# Patient Record
Sex: Female | Born: 1980
Health system: Southern US, Community
[De-identification: ages and names within clinical notes are randomized; demographics above are authoritative.]

## PROBLEM LIST (undated history)

## (undated) DIAGNOSIS — F419 Anxiety disorder, unspecified: Secondary | ICD-10-CM

## (undated) DIAGNOSIS — K5900 Constipation, unspecified: Secondary | ICD-10-CM

## (undated) DIAGNOSIS — E282 Polycystic ovarian syndrome: Secondary | ICD-10-CM

## (undated) DIAGNOSIS — F329 Major depressive disorder, single episode, unspecified: Secondary | ICD-10-CM

## (undated) DIAGNOSIS — M1990 Unspecified osteoarthritis, unspecified site: Secondary | ICD-10-CM

## (undated) DIAGNOSIS — K589 Irritable bowel syndrome without diarrhea: Secondary | ICD-10-CM

## (undated) DIAGNOSIS — D219 Benign neoplasm of connective and other soft tissue, unspecified: Secondary | ICD-10-CM

## (undated) DIAGNOSIS — N63 Unspecified lump in unspecified breast: Secondary | ICD-10-CM

## (undated) DIAGNOSIS — G894 Chronic pain syndrome: Secondary | ICD-10-CM

## (undated) DIAGNOSIS — F32A Depression, unspecified: Secondary | ICD-10-CM

## (undated) DIAGNOSIS — E119 Type 2 diabetes mellitus without complications: Secondary | ICD-10-CM

## (undated) DIAGNOSIS — K219 Gastro-esophageal reflux disease without esophagitis: Secondary | ICD-10-CM

## (undated) HISTORY — DX: Irritable bowel syndrome, unspecified: K58.9

## (undated) HISTORY — PX: KNEE SURGERY: SHX244

## (undated) HISTORY — DX: Depression, unspecified: F32.A

## (undated) HISTORY — DX: Constipation, unspecified: K59.00

## (undated) HISTORY — DX: Chronic pain syndrome: G89.4

## (undated) HISTORY — DX: Benign neoplasm of connective and other soft tissue, unspecified: D21.9

## (undated) HISTORY — PX: POLYPECTOMY: SHX149

## (undated) HISTORY — DX: Major depressive disorder, single episode, unspecified: F32.9

## (undated) HISTORY — DX: Polycystic ovarian syndrome: E28.2

## (undated) HISTORY — DX: Unspecified osteoarthritis, unspecified site: M19.90

## (undated) HISTORY — PX: WISDOM TOOTH EXTRACTION: SHX21

## (undated) HISTORY — PX: MYOMECTOMY: SHX85

## (undated) HISTORY — DX: Gastro-esophageal reflux disease without esophagitis: K21.9

## (undated) HISTORY — PX: COLONOSCOPY: SHX174

## (undated) HISTORY — PX: OTHER SURGICAL HISTORY: SHX169

## (undated) HISTORY — DX: Anxiety disorder, unspecified: F41.9

---

## 2004-05-22 ENCOUNTER — Other Ambulatory Visit: Admission: RE | Admit: 2004-05-22 | Discharge: 2004-05-22 | Payer: Self-pay | Admitting: Gynecology

## 2005-10-12 ENCOUNTER — Emergency Department (HOSPITAL_COMMUNITY): Admission: EM | Admit: 2005-10-12 | Discharge: 2005-10-12 | Payer: Self-pay | Admitting: Emergency Medicine

## 2006-12-22 ENCOUNTER — Other Ambulatory Visit: Admission: RE | Admit: 2006-12-22 | Discharge: 2006-12-22 | Payer: Self-pay | Admitting: Gynecology

## 2007-10-19 ENCOUNTER — Ambulatory Visit: Payer: Self-pay | Admitting: Internal Medicine

## 2007-10-19 LAB — CONVERTED CEMR LAB
AST: 19 units/L (ref 0–37)
BUN: 11 mg/dL (ref 6–23)
Basophils Absolute: 0 10*3/uL (ref 0.0–0.1)
CO2: 27 meq/L (ref 19–32)
Calcium: 9.4 mg/dL (ref 8.4–10.5)
Chloride: 106 meq/L (ref 96–112)
GFR calc Af Amer: 96 mL/min
HCT: 40.9 % (ref 36.0–46.0)
MCV: 86.8 fL (ref 78.0–100.0)
Neutro Abs: 3 10*3/uL (ref 1.4–7.7)
Neutrophils Relative %: 60.2 % (ref 43.0–77.0)
Platelets: 201 10*3/uL (ref 150–400)
RBC: 4.72 M/uL (ref 3.87–5.11)
TSH: 0.61 microintl units/mL (ref 0.35–5.50)
WBC: 4.9 10*3/uL (ref 4.5–10.5)

## 2007-10-20 ENCOUNTER — Ambulatory Visit: Payer: Self-pay | Admitting: Internal Medicine

## 2007-11-10 ENCOUNTER — Ambulatory Visit: Payer: Self-pay | Admitting: Internal Medicine

## 2008-01-11 ENCOUNTER — Other Ambulatory Visit: Admission: RE | Admit: 2008-01-11 | Discharge: 2008-01-11 | Payer: Self-pay | Admitting: Obstetrics and Gynecology

## 2008-02-21 ENCOUNTER — Ambulatory Visit: Payer: Self-pay | Admitting: Internal Medicine

## 2008-08-27 ENCOUNTER — Other Ambulatory Visit: Admission: RE | Admit: 2008-08-27 | Discharge: 2008-08-27 | Payer: Self-pay | Admitting: Obstetrics and Gynecology

## 2010-03-15 ENCOUNTER — Emergency Department (HOSPITAL_BASED_OUTPATIENT_CLINIC_OR_DEPARTMENT_OTHER): Admission: EM | Admit: 2010-03-15 | Discharge: 2010-03-16 | Payer: Self-pay | Admitting: Emergency Medicine

## 2010-03-16 ENCOUNTER — Ambulatory Visit: Payer: Self-pay | Admitting: Radiology

## 2010-12-25 ENCOUNTER — Telehealth (INDEPENDENT_AMBULATORY_CARE_PROVIDER_SITE_OTHER): Payer: Self-pay | Admitting: *Deleted

## 2010-12-29 NOTE — Progress Notes (Signed)
  Phone Note Other Incoming   Request: Send information Summary of Call: Request for records received from Lorenza Burton. Request forwarded to [employer].  Marina Goodell

## 2011-01-18 LAB — URINALYSIS, ROUTINE W REFLEX MICROSCOPIC
Glucose, UA: NEGATIVE mg/dL
Ketones, ur: NEGATIVE mg/dL
Nitrite: NEGATIVE
Protein, ur: NEGATIVE mg/dL
Specific Gravity, Urine: 1.016 (ref 1.005–1.030)
pH: 6 (ref 5.0–8.0)

## 2011-03-16 NOTE — Assessment & Plan Note (Signed)
[facility]                         GASTROENTEROLOGY OFFICE NOTE   NAME:Bowman, Heather                        MRN:          [id number]  DATE:11/10/2007                            DOB:          06/15/1981    HISTORY:  Heather Bowman presents for followup.  She is a 30 year old who was  evaluated October 19, 2007 for a 37-month history of multiple GI  complaints, manifested principally by abdominal cramping, urgency,  diarrhea, and gas.  As well, nausea with rare bouts of vomiting.  See  that dictation for details.  She underwent multiple stool studies, which  were negative for enteric pathogens.  There was no evidence of fecal fat  or occult blood.  Multiple laboratories including CBC, comprehensive  metabolic panel, thyroid profile, and C-reactive protein were also  normal.  She then underwent upper endoscopy.  She was found to have mild  esophagitis.  Otherwise, normal exam.  Biopsies of the post bulbar  duodenum were normal.  Biopsies of the normal-appearing gastric antrum  revealed chronic active gastritis with Helicobacter pylori.  She was  prescribed Nexium, but subsequently took omeprazole due to cost.  She  states this worsened her diarrhea and, thus, discontinued medication.  She was also prescribed Bentyl 20 mg 3 times daily, which she is taking.  She also takes Imodium about 4 per day, as well as Gas-X.  She states  her symptoms are essentially unchanged from the last visit.   PHYSICAL EXAM:  Well-appearing female.  Blood pressure is 108/76, heart rate is 80, weight 131.2 pounds (no  change).  ABDOMEN:  Not re-examined.   IMPRESSION:  I suspect Heather Bowman has irritable bowel syndrome.  We  discussed irritable bowel syndrome in detail, as well as supplemental  literature provided.  She also has Helicobacter pylori on gastric  biopsies.  I doubt this is responsible for significant symptoms.   RECOMMENDATIONS:  1. Prevpac x2 weeks for eradication of  Helicobacter pylori.  2. Continue Bentyl and Imodium for symptomatic relief.  3. Routine office followup in 6 weeks for reassessment.     Wilhemina Bonito. Marina Goodell, MD  Electronically Signed    JNP/MedQ  DD: 11/10/2007  DT: 11/10/2007  Job #: [id number]   cc:   Marcial Pacas P. Fontaine, M.D.

## 2011-03-16 NOTE — Assessment & Plan Note (Signed)
[facility]                         GASTROENTEROLOGY OFFICE NOTE   NAME:Bowman, Heather                        MRN:          [id number]  DATE:10/19/2007                            DOB:          22-Mar-1981    The patient is self referred.   REASON FOR CONSULTATION:  Multiple GI complaints.   HISTORY:  This is a pleasant 30 year old African American female with no  significant past medical history who presents herself today for  evaluation of multiple GI complaints of 6 months' duration.  Her chief  complaint is that of abdominal cramping, urgency, and diarrhea.  This  alternating with bouts of constipation.  Her symptoms are worse in the  morning.  She takes Imodium on a regular basis.  She denies bleeding,  fevers, or abdominal pain other than pre-defecatory cramping.  She has  had food avoidance and 6 to 8 pounds weight loss.  Stress may be a  factor, though food does not seem to bring on symptoms.  She also  reports to me that she has had fairly regular nausea, particularly in  the morning.  She also vomits about once every 2 weeks.  The vomitus  tends to contain food from the previous evening's meal.  She has not had  evaluation or other treatment.   PAST MEDICAL HISTORY:  None.   PAST SURGICAL HISTORY:  None.   ALLERGIES:  No known drug allergies.   CURRENT MEDICATIONS:  None.   FAMILY HISTORY:  Negative for gastrointestinal disorders.   SOCIAL HISTORY:  The patient is single and lives alone.  She works at  [employer] as a Conservation officer, nature.  She does not smoke.  She uses alcohol  on weekends only.   REVIEW OF SYSTEMS:  Per diagnostic evaluation, also separate from  diagnostic evaluation form, of note excessive thirst and urination and  sleeping problems.  Her menstrual periods are regular and last 4-7 days.  Her last menstrual period was finished about 2 weeks ago.   PHYSICAL EXAMINATION:  GENERAL:  A well-appearing female in no acute  distress.  She is alert and oriented.  VITAL SIGNS:  Blood pressure is 106/80, heart rate 68 and regular,  weight is 130.8 pounds.  She is 5 feet 5 inches in height.  HEENT:  Sclerae are anicteric.  Conjunctivae are pink.  Oral mucosa is  intact.  No adenopathy.  LUNGS:  Clear.  HEART:  Regular.  ABDOMEN:  Soft without tenderness, mass, or hernia.  Good bowel sounds  heard.  EXTREMITIES:  Without edema.   IMPRESSION:  This is a 30 year old female who presents with a six month  history of crampy, urgency, and loose stools as well somewhat chronic  nausea and infrequent vomiting (with undigested food).  She has also had  weight loss.  Symptoms are atypical for irritable bowel.   RECOMMENDATIONS:  1. Multiple stool studies to exclude enteric pathogens,fecal      fat,occult blood.  2. Laboratory including CBC, comprehensive metabolic panel, thyroid      profile, and C-reactive protein.  3. Schedule upper endoscopy with gastric and  duodenal biopsies.  4. Further evaluation including possible treatment with antimicrobials      or probiotics to be determined after the results of the above      workup are known.     Wilhemina Bonito. Marina Goodell, MD  Electronically Signed    JNP/MedQ  DD: 10/19/2007  DT: 10/19/2007  Job #: [id number]   cc:   Marcial Pacas P. Fontaine, M.D.

## 2011-03-16 NOTE — Assessment & Plan Note (Signed)
[facility]                         GASTROENTEROLOGY OFFICE NOTE   NAME:[name], [name]                        MRN:          [id number]  DATE:02/21/2008                            DOB:          1981/06/10    HISTORY:  Heather Bowman presents today with complaints of abdominal cramping,  urgency, and increased intestinal gas.  She was initially evaluated  October 19, 2007, for multiple GI complaints of six months' duration.  See that dictation for details.  She underwent extensive workup  including stool studies and laboratories.  These were negative.  She  also underwent upper endoscopy with biopsies which revealed gastritis  with H. pylori.  No sprue or eosinophilic changes.  She was last seen in  the office November 10, 2007.  At that time she was felt to have irritable  bowel syndrome, likely post infectious.  Helicobacter pylori was  treated.  It was recommended she consider using Bentyl and Imodium for  symptomatic relief and follow up in six weeks.  She follows up at this  time.  She tells me that Prevpac seemed to help some.  Bentyl had no  positive effect.  She uses Imodium 2-6 times per day to help control gas  and urgency.  She has had no problems with nausea, vomiting, or  constipation.  Biggest complaint is increased gas and feces that have a  foul odor.  She thinks her symptoms may be worse with dairy products and  vegetables.  Of interest, the patient averages having a bowel movement  once every other day.  She certainly does not have a bowel movement  daily or more than once per day.  No bleeding, steatorrhea, or other  issues.  Weight has been stable.  Her only medication is Imodium p.r.n.   PHYSICAL EXAMINATION:  GENERAL:  Well-appearing female in no acute  distress.  VITAL SIGNS:  Blood pressure 120/56, heart rate 98, weight 129.6 pounds.  HEENT:  Sclerae anicteric.  ABDOMEN: Soft without tenderness, mass, or hernia.  Good bowel sounds  heard.   IMPRESSION:  Irritable bowel syndrome, likely post infectious.   RECOMMENDATIONS:  1. Empiric treatment with the probiotic Align.  Three weeks of samples      have been given.  She may use this on demand if helpful.  2. Okay to use Imodium as needed for true diarrhea but not for gas.  3. Told that she could try over-the-counter lactaid to see if this      helps symptoms when consuming dairy products.  4. Follow up p.r.n.     Wilhemina Bonito. Marina Goodell, MD  Electronically Signed    JNP/MedQ  DD: 02/21/2008  DT: 02/21/2008  Job #: [id number]   cc:   Marcial Pacas P. Fontaine, M.D.

## 2012-05-02 ENCOUNTER — Ambulatory Visit (INDEPENDENT_AMBULATORY_CARE_PROVIDER_SITE_OTHER): Payer: Self-pay | Admitting: Family Medicine

## 2012-05-02 ENCOUNTER — Encounter: Payer: Self-pay | Admitting: Family Medicine

## 2012-05-02 VITALS — BP 131/87 | HR 102 | Temp 98.5°F | Ht 65.5 in | Wt 168.0 lb

## 2012-05-02 DIAGNOSIS — G894 Chronic pain syndrome: Secondary | ICD-10-CM

## 2012-05-02 DIAGNOSIS — F411 Generalized anxiety disorder: Secondary | ICD-10-CM

## 2012-05-02 DIAGNOSIS — K219 Gastro-esophageal reflux disease without esophagitis: Secondary | ICD-10-CM

## 2012-05-02 DIAGNOSIS — Y990 Civilian activity done for income or pay: Secondary | ICD-10-CM

## 2012-05-02 MED ORDER — BUPROPION HCL ER (XL) 150 MG PO TB24: 150.0000 mg | ORAL_TABLET | Freq: Every day | ORAL | Status: DC

## 2012-05-02 MED ORDER — TIZANIDINE HCL 2 MG PO CAPS: 2.0000 mg | ORAL_CAPSULE | Freq: Two times a day (BID) | ORAL | Status: DC

## 2012-05-02 MED ORDER — IBUPROFEN-FAMOTIDINE 800-26.6 MG PO TABS: 1.0000 | ORAL_TABLET | ORAL | Status: DC

## 2012-05-02 MED ORDER — TAPENTADOL HCL 75 MG PO TABS: 1.0000 | ORAL_TABLET | Freq: Three times a day (TID) | ORAL | Status: DC | PRN

## 2012-05-02 NOTE — Assessment & Plan Note (Signed)
Patient under worker's comp and seen at [facility].  She will continue to get medications from Dr. Manon Hilding.

## 2012-05-02 NOTE — Patient Instructions (Signed)
Return to clinic after you get your orange card.

## 2012-05-02 NOTE — Progress Notes (Signed)
  Subjective:    Patient ID: Heather Bowman, female    DOB: 1981/05/03, 31 y.o.   MRN: [id number]  HPI  Patient presents to clinic to establish care.  She was former patient at [facility], but lost insurance after an injury at work in 2011.  She injured her neck and LT shoulder from heavy lifting.  [facility] which is covered by worker's comp.  She says the pain medications are working well, she has been on them for about one year.  Patient has a hx of PCOS but has stopped taking progesterone pills.  She would like to get pregnant, but was told she is infertile.  She would like to get lab work done (not specific about which ones), but understands that labs will be postponed until she receives orange cards.  Otherwise, patient is doing well and has no complaints/concerns.  Reviewed patient's PMH, FH, SH, and Meds/Allergies at this visit.  Review of Systems  Per HPI     Objective:   Physical Exam  General: in no acute distress, alert, and cooperative Psych: normal behavior and affect     Assessment & Plan:

## 2012-05-02 NOTE — Assessment & Plan Note (Signed)
Continue Bupropion per worker's comp physician.

## 2012-05-09 ENCOUNTER — Telehealth: Payer: Self-pay | Admitting: Family Medicine

## 2012-05-09 ENCOUNTER — Encounter: Payer: Self-pay | Admitting: Family Medicine

## 2012-05-09 ENCOUNTER — Ambulatory Visit (INDEPENDENT_AMBULATORY_CARE_PROVIDER_SITE_OTHER): Payer: Self-pay | Admitting: Family Medicine

## 2012-05-09 VITALS — BP 125/81 | HR 76 | Temp 99.2°F | Ht 65.5 in | Wt 167.8 lb

## 2012-05-09 DIAGNOSIS — N926 Irregular menstruation, unspecified: Secondary | ICD-10-CM

## 2012-05-09 DIAGNOSIS — N949 Unspecified condition associated with female genital organs and menstrual cycle: Secondary | ICD-10-CM

## 2012-05-09 DIAGNOSIS — N920 Excessive and frequent menstruation with regular cycle: Secondary | ICD-10-CM | POA: Insufficient documentation

## 2012-05-09 DIAGNOSIS — Z23 Encounter for immunization: Secondary | ICD-10-CM

## 2012-05-09 DIAGNOSIS — R102 Pelvic and perineal pain: Secondary | ICD-10-CM

## 2012-05-09 DIAGNOSIS — Z1384 Encounter for screening for dental disorders: Secondary | ICD-10-CM

## 2012-05-09 LAB — CBC
HCT: 40.7 % (ref 36.0–46.0)
Hemoglobin: 13.8 g/dL (ref 12.0–15.0)
MCH: 27.4 pg (ref 26.0–34.0)
MCHC: 33.9 g/dL (ref 30.0–36.0)
WBC: 4.6 10*3/uL (ref 4.0–10.5)

## 2012-05-09 LAB — BASIC METABOLIC PANEL
BUN: 12 mg/dL (ref 6–23)
Glucose, Bld: 79 mg/dL (ref 70–99)
Potassium: 4.3 mEq/L (ref 3.5–5.3)

## 2012-05-09 MED ORDER — METFORMIN HCL 500 MG PO TABS: 500.0000 mg | ORAL_TABLET | Freq: Every day | ORAL | Status: DC

## 2012-05-09 MED ORDER — PROMETHAZINE HCL 12.5 MG PO TABS: 12.5000 mg | ORAL_TABLET | Freq: Four times a day (QID) | ORAL | Status: DC | PRN

## 2012-05-09 NOTE — Telephone Encounter (Signed)
Forgot to ask Dr Tye Savoy for a referral to dental

## 2012-05-09 NOTE — Telephone Encounter (Signed)
Spoke with patient and informed her that referral was put in and that it may take a couple weeks to a couple months to get an answer back due to them only taking 10 patients a month

## 2012-05-09 NOTE — Telephone Encounter (Signed)
Referral done

## 2012-05-09 NOTE — Assessment & Plan Note (Signed)
Pelvic pain likely due to severe menstrual cramps from irregular endometrial shedding. Will order pelvic ultrasound to rule out any masses or ovarian abnormalities. Will order CBC to rule out anemia and BMET to check for blood glucose. For PCOS, will start Metformin 500 daily and encouraged weight loss to help regulate periods. Follow up ultrasound, will likely start Provera 10 mg daily x 7 days. Patient to follow up with me every 3 months or as needed.

## 2012-05-09 NOTE — Patient Instructions (Addendum)
It was good to see you again. I will call you with results of ultrasound 48 hours after you get it done. Please pick up Metformin and Phenergan at the pharmacy and take as directed. Schedule follow up appointment with me in 3 months or sooner as needed.  Polycystic Ovarian Syndrome Polycystic ovarian syndrome is a condition with a number of problems. One problem is with the ovaries. The ovaries are organs located in the female pelvis, on each side of the uterus. Usually, during the menstrual cycle, an egg is released from 1 ovary every month. This is called ovulation. When the egg is fertilized, it goes into the womb (uterus), which allows for the growth of a baby. The egg travels from the ovary through the fallopian tube to the uterus. The ovaries also make the hormones estrogen and progesterone. These hormones help the development of a woman's breasts, body shape, and body hair. They also regulate the menstrual cycle and pregnancy. Sometimes, cysts form in the ovaries. A cyst is a fluid-filled sac. On the ovary, different types of cysts can form. The most common type of ovarian cyst is called a functional or ovulation cyst. It is normal, and often forms during the normal menstrual cycle. Each month, a woman's ovaries grow tiny cysts that hold the eggs. When an egg is fully grown, the sac breaks open. This releases the egg. Then, the sac which released the egg from the ovary dissolves. In one type of functional cyst, called a follicle cyst, the sac does not break open to release the egg. It may actually continue to grow. This type of cyst usually disappears within 1 to 3 months.  One type of cyst problem with the ovaries is called Polycystic Ovarian Syndrome (PCOS). In this condition, many follicle cysts form, but do not rupture and produce an egg. This health problem can affect the following:  Menstrual cycle.   Heart.   Obesity.   Cancer of the uterus.   Fertility.   Blood vessels.   Hair  growth (face and body) or baldness.   Hormones.   Appearance.   High blood pressure.   Stroke.   Insulin production.   Inflammation of the liver.   Elevated blood cholesterol and triglycerides.  CAUSES   No one knows the exact cause of PCOS.   Women with PCOS often have a mother or sister with PCOS. There is not yet enough proof to say this is inherited.   Many women with PCOS have a weight problem.   Researchers are looking at the relationship between PCOS and the body's ability to make insulin. Insulin is a hormone that regulates the change of sugar, starches, and other food into energy for the body's use, or for storage. Some women with PCOS make too much insulin. It is possible that the ovaries react by making too many female hormones, called androgens. This can lead to acne, excessive hair growth, weight gain, and ovulation problems.   Too much production of luteinizing hormone (LH) from the pituitary gland in the brain stimulates the ovary to produce too much female hormone (androgen).  SYMPTOMS   Infrequent or no menstrual periods, and/or irregular bleeding.   Inability to get pregnant (infertility), because of not ovulating.   Increased growth of hair on the face, chest, stomach, back, thumbs, thighs, or toes.   Acne, oily skin, or dandruff.   Pelvic pain.   Weight gain or obesity, usually carrying extra weight around the waist.   Type 2  diabetes (this is the diabetes that usually does not need insulin).   High cholesterol.   High blood pressure.   Female-pattern baldness or thinning hair.   Patches of thickened and dark brown or black skin on the neck, arms, breasts, or thighs.   Skin tags, or tiny excess flaps of skin, in the armpits or neck area.   Sleep apnea (excessive snoring and breathing stops at times while asleep).   Deepening of the voice.   Gestational diabetes when pregnant.   Increased risk of miscarriage with pregnancy.  DIAGNOSIS  There is  no single test to diagnose PCOS.   Your caregiver will:   Take a medical history.   Perform a pelvic exam.   Perform an ultrasound.   Check your female and female hormone levels.   Measure glucose or sugar levels in the blood.   Do other blood tests.   If you are producing too many female hormones, your caregiver will make sure it is from PCOS. At the physical exam, your caregiver will want to evaluate the areas of increased hair growth. Try to allow natural hair growth for a few days before the visit.   During a pelvic exam, the ovaries may be enlarged or swollen by the increased number of small cysts. This can be seen more easily by vaginal ultrasound or screening, to examine the ovaries and lining of the uterus (endometrium) for cysts. The uterine lining may become thicker, if there has not been a regular period.  TREATMENT  Because there is no cure for PCOS, it needs to be managed to prevent problems. Treatments are based on your symptoms. Treatment is also based on whether you want to have a baby or whether you need contraception.  Treatment may include:  Progesterone hormone, to start a menstrual period.   Birth control pills, to make you have regular menstrual periods.   Medicines to make you ovulate, if you want to get pregnant.   Medicines to control your insulin.   Medicine to control your blood pressure.   Medicine and diet, to control your high cholesterol and triglycerides in your blood.   Surgery, making small holes in the ovary, to decrease the amount of female hormone production. This is done through a long, lighted tube (laparoscope), placed into the pelvis through a tiny incision in the lower abdomen.  Your caregiver will go over some of the choices with you. WOMEN WITH PCOS HAVE THESE CHARACTERISTICS:  High levels of female hormones called androgens.   An irregular or no menstrual cycle.   May have many small cysts in their ovaries.  PCOS is the most common  hormonal reproductive problem in women of childbearing age. WHY DO WOMEN WITH PCOS HAVE TROUBLE WITH THEIR MENSTRUAL CYCLE? Each month, about 20 eggs start to mature in the ovaries. As one egg grows and matures, the follicle breaks open to release the egg, so it can travel through the fallopian tube for fertilization. When the single egg leaves the follicle, ovulation takes place. In women with PCOS, the ovary does not make all of the hormones it needs for any of the eggs to fully mature. They may start to grow and accumulate fluid, but no one egg becomes large enough. Instead, some may remain as cysts. Since no egg matures or is released, ovulation does not occur and the hormone progesterone is not made. Without progesterone, a woman's menstrual cycle is irregular or absent. Also, the cysts produce female hormones, which continue to  prevent ovulation.  Document Released: 02/11/2005 Document Revised: 10/07/2011 Document Reviewed: 09/05/2009 [facility] Patient Information 2012 [place], [place].

## 2012-05-09 NOTE — Progress Notes (Signed)
  Subjective:    Patient ID: Heather Bowman, female    DOB: 06/20/1981, 31 y.o.   MRN: [id number]  HPI  Patient presents to clinic to discuss previous diagnosis of PCOS in 2010.  She remembers having a pelvic ultrasound that showed multiple cysts, however she lost her insurance and was unable to follow up with OB-GYN.  Patient's main concern is prolonged bleeding.  Has been bleeding non-stop for 4 weeks.  Started out as spotting but last 3 days, bleeding has been heavy and very painful.    Abdominal pain localized to RT lower pelvic area, no radiation.  She takes Ibuprofen and Tapentadol which is prescribed for her shoulder injury, and this seems to ease pain.  No associated fevers, chills, night sweats, nausea or vomiting.  No dysuria or vaginal discharge.  No decreased appetite.  Of note, patient desires future pregnancies.  Review of Systems  Per HPI    Objective:   Physical Exam  Constitutional: No distress.  Pulmonary/Chest: Effort normal.  Abdominal: Soft. Bowel sounds are normal. She exhibits no distension. There is tenderness. There is no rebound and no guarding.  Genitourinary:       Pelvic exam deferred; [facility] and says last exam was normal.  Skin: Skin is warm and dry. No rash noted.          Assessment & Plan:

## 2012-05-09 NOTE — Telephone Encounter (Signed)
Can you put a dental referral on this patient

## 2012-05-10 ENCOUNTER — Encounter (HOSPITAL_COMMUNITY): Payer: Self-pay | Admitting: Family Medicine

## 2012-05-10 NOTE — Addendum Note (Signed)
Addended by: Farrell Ours on: 05/10/2012 09:55 AM   Modules accepted: Orders

## 2012-05-12 ENCOUNTER — Ambulatory Visit (HOSPITAL_COMMUNITY)
Admission: RE | Admit: 2012-05-12 | Discharge: 2012-05-12 | Disposition: A | Discharge status: Home / Self Care | Payer: Self-pay | Source: Ambulatory Visit | Attending: Family Medicine | Admitting: Family Medicine

## 2012-05-12 DIAGNOSIS — N926 Irregular menstruation, unspecified: Secondary | ICD-10-CM | POA: Insufficient documentation

## 2012-05-12 DIAGNOSIS — D251 Intramural leiomyoma of uterus: Secondary | ICD-10-CM | POA: Insufficient documentation

## 2012-05-12 DIAGNOSIS — D250 Submucous leiomyoma of uterus: Secondary | ICD-10-CM | POA: Insufficient documentation

## 2012-05-12 DIAGNOSIS — E282 Polycystic ovarian syndrome: Secondary | ICD-10-CM | POA: Insufficient documentation

## 2012-05-12 DIAGNOSIS — N83209 Unspecified ovarian cyst, unspecified side: Secondary | ICD-10-CM | POA: Insufficient documentation

## 2012-05-17 ENCOUNTER — Telehealth: Payer: Self-pay | Admitting: Family Medicine

## 2012-05-17 DIAGNOSIS — D259 Leiomyoma of uterus, unspecified: Secondary | ICD-10-CM

## 2012-05-17 NOTE — Telephone Encounter (Signed)
Hi team, please call patient and let her know I will refer her to [facility] to discuss options for fibroids found on her ultrasound.  [facility] different options for treatment.

## 2012-05-18 NOTE — Telephone Encounter (Signed)
LVM for patient to call back. ?

## 2012-05-18 NOTE — Telephone Encounter (Signed)
Patient called back and I informed her of below.

## 2012-05-24 ENCOUNTER — Telehealth: Payer: Self-pay | Admitting: Family Medicine

## 2012-05-24 NOTE — Telephone Encounter (Signed)
Wanted to know if this medication causes bleeding. I looked back in her chart and she was given this to help stop the bleeding that is associated with PCOS. I asked pt if the provera helped and she stated that she did not receive an Rx for this. She is in a lot of pain on right side which is the same as before. Offered her an appt for tomorrow to be seen with Dr. Earnest Bailey @ 945 am. Also she has an upcoming appt with Dr. Debroah Loop on 8.15.2013 at [facility] pt is aware of this appt. I will forward this to Dr.de Lawson Radar and Dr. Earnest Bailey.Heather Bowman

## 2012-05-24 NOTE — Telephone Encounter (Signed)
Patient is calling with questions about Metformin.

## 2012-05-24 NOTE — Telephone Encounter (Signed)
Attempted to call patient back but went to voicemail.  If she calls back today, please ask her for specific details/questions about Metformin.  Thanks.

## 2012-05-25 ENCOUNTER — Encounter: Payer: Self-pay | Admitting: Family Medicine

## 2012-05-25 ENCOUNTER — Ambulatory Visit (INDEPENDENT_AMBULATORY_CARE_PROVIDER_SITE_OTHER): Payer: Self-pay | Admitting: Family Medicine

## 2012-05-25 ENCOUNTER — Other Ambulatory Visit (HOSPITAL_COMMUNITY)
Admission: RE | Admit: 2012-05-25 | Discharge: 2012-05-25 | Disposition: A | Discharge status: Home / Self Care | Payer: Self-pay | Source: Ambulatory Visit | Attending: Family Medicine | Admitting: Family Medicine

## 2012-05-25 VITALS — BP 129/88 | HR 106 | Temp 98.3°F | Ht 65.5 in | Wt 170.0 lb

## 2012-05-25 DIAGNOSIS — Z113 Encounter for screening for infections with a predominantly sexual mode of transmission: Secondary | ICD-10-CM | POA: Insufficient documentation

## 2012-05-25 DIAGNOSIS — E282 Polycystic ovarian syndrome: Secondary | ICD-10-CM

## 2012-05-25 DIAGNOSIS — N920 Excessive and frequent menstruation with regular cycle: Secondary | ICD-10-CM

## 2012-05-25 LAB — POCT WET PREP (WET MOUNT): Clue Cells Wet Prep Whiff POC: NEGATIVE

## 2012-05-25 MED ORDER — NORGESTIMATE-ETH ESTRADIOL 0.25-35 MG-MCG PO TABS: 1.0000 | ORAL_TABLET | Freq: Every day | ORAL | Status: DC

## 2012-05-25 NOTE — Progress Notes (Signed)
  Subjective:    Patient ID: Heather Bowman, female    DOB: 28-May-1981, 31 y.o.   MRN: [id number]  HPIwork in for menstrual bleeding  Here for eval of heavy menstrual bleeding.  Was seen by PCP on July 9th.  Workup at that time involved pelvic ultrasound.  Showed uterine fibroids and polycystic ovary consistent with diagnosis of PCOS.  Reports menstrual cycles are erratic, sometimes skipping months, and then with heavy bleeding.  Patient was started on metformin 500 mg daily, reports bleeding slowed, then several day ago restarted.  Heavy, wearing adult incontinence pads, changing several times daily.  No dizziness, dyspnea, chest pain.  I have reviewed patient's  PMH, FH, and Social history and Medications as related to this visit.  Review of Systemssee HPI     Objective:   Physical Exam GEN: Alert & Oriented, No acute distress CV:  Regular Rate & Rhythm, no murmur Respiratory:  Normal work of breathing, CTAB Abd:  + BS, soft, no tenderness to palpation Ext: no pre-tibial edema Pelvic Exam:        External: normal female genitalia without lesions or masses        Vagina: normal without lesions or masses        Cervix: normal without lesions or masses        Adnexa: normal bimanual exam without masses or fullness        Uterus: normal by palpation  Pap smear:  States was performed this spring by free screening and was normal.        Samples for Wet prep, GC/Chlamydia obtained hirsuitism with dark hair on lower face       Assessment & Plan:

## 2012-05-25 NOTE — Patient Instructions (Addendum)
Take 3 birth controls tablets today, 2 tomorrow, then one every day as directed  Can use phenergan for nausea  Follow-up with your primary doctor in 3 months or sooner if needed

## 2012-05-26 DIAGNOSIS — E282 Polycystic ovarian syndrome: Secondary | ICD-10-CM | POA: Insufficient documentation

## 2012-05-26 NOTE — Assessment & Plan Note (Addendum)
Will complete workup with eval for infectious causes.  I think bleeding more likley due to PCOS/anovulation than uterine fibroids at this time.  Has gyn follow-up that was previously scheduled.  Will place on short taper of OCP 3 pills, then 2, then once daily, with plan to regulate cycles until she plans on starting a family.

## 2012-05-26 NOTE — Assessment & Plan Note (Signed)
Will start OCP, may continue metformin rxed by PCP.  Advised on importance of exercise and weight management.  Will follow-up with PCP in 3 months or sooner if needed.

## 2012-05-29 ENCOUNTER — Encounter: Payer: Self-pay | Admitting: Family Medicine

## 2012-06-15 ENCOUNTER — Encounter: Payer: Self-pay | Admitting: Obstetrics & Gynecology

## 2012-06-15 ENCOUNTER — Ambulatory Visit (INDEPENDENT_AMBULATORY_CARE_PROVIDER_SITE_OTHER): Payer: Self-pay | Admitting: Obstetrics & Gynecology

## 2012-06-15 VITALS — BP 144/92 | HR 113 | Temp 98.3°F | Ht 65.0 in | Wt 169.5 lb

## 2012-06-15 DIAGNOSIS — E282 Polycystic ovarian syndrome: Secondary | ICD-10-CM

## 2012-06-15 DIAGNOSIS — N920 Excessive and frequent menstruation with regular cycle: Secondary | ICD-10-CM

## 2012-06-15 NOTE — Patient Instructions (Signed)
Infertility WHAT IS INFERTILITY?  Infertility is usually defined as not being able to get pregnant after trying for one year of regular sexual intercourse without the use of contraceptives. Or not being able to carry a pregnancy to term and have a baby. The infertility rate in the [place] is around 10%. Pregnancy is the result of a chain of events. A woman must release an egg from one of her ovaries (ovulation). The egg must be fertilized by the female sperm. Then it travels through a fallopian tube into the uterus (womb), where it attaches to the wall of the uterus and grows. A man must have enough sperm, and the sperm must join with (fertilize) the egg along the way, at the proper time. The fertilized egg must then become attached to the inside of the uterus. While this may seem simple, many things can happen to prevent pregnancy from occurring.  WHOSE PROBLEM IS IT?  About 20% of infertility cases are due to problems with the man (female factors) and 65% are due to problems with the woman (female factors). Other cases are due to a combination of female and female factors or to unknown causes.  WHAT CAUSES INFERTILITY IN MEN?  Infertility in men is often caused by problems with making enough normal sperm or getting the sperm to reach the egg. Problems with sperm may exist from birth or develop later in life, due to illness or injury. Some men produce no sperm, or produce too few sperm (oligospermia). Other problems include:  Sexual dysfunction.   Hormonal or endocrine problems.   Age. Female fertility decreases with age, but not at as young an age as female fertility.   Infection.   Congenital problems. Birth defect, such as absence of the tubes that carry the sperm (vas deferens).   Genetic/chromosomal problems.   Antisperm antibody problems.   Retrograde ejaculation (sperm go into the bladder).   Varicoceles, spematoceles, or tumors of the testicles.   Lifestyle can influence the number  and quality of a man's sperm.   Alcohol and drugs can temporarily reduce sperm quality.   Environmental toxins, including pesticides and lead, may cause some cases of infertility in men.  WHAT CAUSES INFERTILITY IN WOMEN?   Problems with ovulation account for most infertility in women. Without ovulation, eggs are not available to be fertilized.   Signs of problems with ovulation include irregular menstrual periods or no periods at all.   Simple lifestyle factors, including stress, diet, or athletic training, can affect a woman's hormonal balance.   Age. Fertility begins to decrease in women in the early 30s and is worse after age 37.   Much less often, a hormonal imbalance from a serious medical problem, such as a pituitary gland tumor, thyroid or other chronic medical disease, can cause ovulation problems.   Pelvic infections.   Polycystic ovary syndrome (increase in female hormones, unable to ovulate).   Alcohol or illegal drugs.   Environmental toxins, radiation, pesticides, and certain chemicals.   Aging is an important factor in female infertility.   The ability of a woman's ovaries to produce eggs declines with age, especially after age 35. About one third of couples where the woman is over 35 will have problems with fertility.   By the time she reaches menopause when her monthly periods stop for good, a woman can no longer produce eggs or become pregnant.   Other problems can also lead to infertility in women. If the fallopian tubes are blocked   at one or both ends, the egg cannot travel through the tubes into the uterus. Scar tissue (adhesions) in the pelvis may cause blocked tubes. This may result from pelvic inflammatory disease, endometriosis, or surgery for an ectopic pregnancy (fertilized egg implanted outside the uterus) or any pelvic or abdominal surgery causing adhesions.   Fibroid tumors or polyps of the uterus.   Congenital (birth defect) abnormalities of the uterus.    Infection of the cervix (cervicitis).   Cervical stenosis (narrowing).   Abnormal cervical mucus.   Polycystic ovary syndrome.   Having sexual intercourse too often (every other day or 4 to 5 times a week).   Obesity.   Anorexia.   Poor nutrition.   Over exercising, with loss of body fat.   DES. Your mother received diethylstilbesterol hormone when pregnant with you.  HOW IS INFERTILITY TESTED?  If you have been trying to have a baby without success, you may want to seek medical help. You should not wait for one year of trying before seeing a health care provider if:  You are over 35.   You have reason to believe that there may be a fertility problem.  A medical evaluation may determine the reasons for a couple's infertility. Usually this process begins with:  Physical exams.   Medical histories of both partners.   Sexual histories of both partners.  If there is no obvious problem, like improperly timed intercourse or absence of ovulation, tests may be needed.   For a man, testing usually begins with tests of his semen to look at:   The number of sperm.   The shape of sperm.   Movement of his sperm.   Taking a complete medical and surgical history.   Physical examination.   Check for infection of the female reproductive organs.  Sometimes hormone tests are done.   For a woman, the first step in testing is to find out if she is ovulating each month. There are several ways to do this. For example, she can keep track of changes in her morning body temperature and in the texture of her cervical mucus. Another tool is a home ovulation test kit, which can be bought at drug or grocery stores.   Checks of ovulation can also be done in the doctor's office, using blood tests for hormone levels or ultrasound tests of the ovaries. If the woman is ovulating, more tests will need to be done. Some common female tests include:   Hysterosalpingogram: An x-ray of the fallopian  tubes and uterus after they are injected with dye. It shows if the tubes are open and shows the shape of the uterus.   Laparoscopy: An exam of the tubes and other female organs for disease. A lighted tube called a laparoscope is used to see inside the abdomen.   Endometrial biopsy: Sample of uterus tissue taken on the first day of the menstrual period, to see if the tissue indicates you are ovulating.   Transvaginal ultrasound: Examines the female organs.   Hysteroscopy: Uses a lighted tube to examine the cervix and inside the uterus, to see if there are any abnormalities inside the uterus.  TREATMENT  Depending on the test results, different treatments can be suggested. The type of treatment depends on the cause. 85 to 90% of infertility cases are treated with drugs or surgery.   Various fertility drugs may be used for women with ovulation problems. It is important to talk with your caregiver about the drug to   be used. You should understand the drug's benefits and side effects. Depending on the type of fertility drug and the dosage of the drug used, multiple births (twins or multiples) can occur in some women.   If needed, surgery can be done to repair damage to a woman's ovaries, fallopian tubes, cervix, or uterus.   Surgery or medical treatment for endometriosis or polycystic ovary syndrome. Sometimes a man has an infertility problem that can be corrected with medicine or by surgery.   Intrauterine insemination (IUI) of sperm, timed with ovulation.   Change in lifestyle, if that is the cause (lose weight, increase exercise, and stop smoking, drinking excessively, or taking illegal drugs).   Other types of surgery:   Removing growths inside and on the uterus.   Removing scar tissue from inside of the uterus.   Fixing blocked tubes.   Removing scar tissue in the pelvis and around the female organs.  WHAT IS ASSISTED REPRODUCTIVE TECHNOLOGY (ART)?  Assisted reproductive technology  (ART) is another form of special methods used to help infertile couples. ART involves handling both the woman's eggs and the man's sperm. Success rates vary and depend on many factors. ART can be expensive and time-consuming. But ART has made it possible for many couples to have children that otherwise would not have been conceived. Some methods are listed below:  In vitro fertilization (IVF). IVF is often used when a woman's fallopian tubes are blocked or when a man has low sperm counts. A drug is used to stimulate the ovaries to produce multiple eggs. Once mature, the eggs are removed and placed in a culture dish with the man's sperm for fertilization. After about 40 hours, the eggs are examined to see if they have become fertilized by the sperm and are dividing into cells. These fertilized eggs (embryos) are then placed in the woman's uterus. This bypasses the fallopian tubes.   Gamete intrafallopian transfer (GIFT) is similar to IVF, but used when the woman has at least one normal fallopian tube. Three to five eggs are placed in the fallopian tube, along with the man's sperm, for fertilization inside the woman's body.   Zygote intrafallopian transfer (ZIFT), also called tubal embryo transfer, combines IVF and GIFT. The eggs retrieved from the woman's ovaries are fertilized in the lab and placed in the fallopian tubes rather than in the uterus.   ART procedures sometimes involve the use of donor eggs (eggs from another woman) or previously frozen embryos. Donor eggs may be used if a woman has impaired ovaries or has a genetic disease that could be passed on to her baby.   When performing ART, you are at higher risk for resulting in multiple pregnancies, twins, triplets or more.   Intracytoplasma sperm injection is a procedure that injects a single sperm into the egg to fertilize it.   Embryo transplant is a procedure that starts after growing an embryo in a special media (chemical solution)  developed to keep the embryo alive for 2 to 5 days, and then transplanting it into the uterus.  In cases where a cause cannot be found and pregnancy does not occur, adoption may be a consideration. Document Released: 10/21/2003 Document Revised: 10/07/2011 Document Reviewed: 09/16/2009 [employer] Patient Information 2012 [employer]. 

## 2012-06-15 NOTE — Progress Notes (Signed)
Patient ID: Rollene Rotunda, female   DOB: 11-15-1980, 31 y.o.   MRN: [id number]  Chief Complaint  Patient presents with  . Fibroids    period lasted x 4 weeks    HPI Marlisa Arutyunyan is a 31 y.o. female.  She comes today for [facility]. As a long history of infertility. She's been with the same partner has not used birth control for long periods of time. They've been together for 5 years. He has no children. She had Chlamydia and Trichomonas about 10 years ago. She saw Dr. Elesa Hacker in [place] for infertility evaluation. She lost her insurance and she did not have  scheduled for hysterosalpingogram. She's not been on ovulation induction. She currently has prescription for metformin which I believe was meant to be for ovulation induction. She has also been prescribed oral contraceptives. She is on her first pack. She had a Pap smear this spring at the free cancer screening. HPI  Past Medical History  Diagnosis Date  . Anxiety   . Arthritis   . GERD (gastroesophageal reflux disease)   . Depression   . Chronic pain syndrome     No past surgical history on file.  Family History  Problem Relation Age of Onset  . Heart disease Father   . Diabetes Father   . Hyperlipidemia Father   . Hypertension Father   . Stroke Father   . Depression Sister   . Diabetes Sister   . Hyperlipidemia Sister   . Hypertension Sister   . Asthma Brother   . Cancer Maternal Grandmother     Social History History  Substance Use Topics  . Smoking status: Never Smoker   . Smokeless tobacco: Never Used  . Alcohol Use: Yes     on holidays or special occasions    No Known Allergies  Current Outpatient Prescriptions  Medication Sig Dispense Refill  . buPROPion (WELLBUTRIN XL) 150 MG 24 hr tablet Take 1 tablet (150 mg total) by mouth daily.  30 tablet  0  . docusate sodium (COLACE) 100 MG capsule Take 100 mg by mouth as needed.      . Ibuprofen-Famotidine 800-26.6 MG TABS Take 1 tablet by mouth  every other day.  90 tablet  0  . metFORMIN (GLUCOPHAGE) 500 MG tablet Take 1 tablet (500 mg total) by mouth daily with breakfast.  30 tablet  3  . norgestimate-ethinyl estradiol (SPRINTEC 28) 0.25-35 MG-MCG tablet Take 1 tablet by mouth daily.  3 Package  4  . omeprazole (PRILOSEC) 20 MG capsule Take 40 mg by mouth daily.      . Tapentadol HCl 75 MG TABS Take 1 tablet (75 mg total) by mouth every 8 (eight) hours as needed.    0  . tizanidine (ZANAFLEX) 2 MG capsule Take 1 capsule (2 mg total) by mouth 2 (two) times daily.  30 capsule  0  . promethazine (PHENERGAN) 12.5 MG tablet Take 1 tablet (12.5 mg total) by mouth every 6 (six) hours as needed for nausea.  30 tablet  3    Review of Systems Review of Systems  Constitutional: Negative.   Gastrointestinal: Positive for abdominal pain and abdominal distention.  Genitourinary: Positive for pelvic pain (with menstrual bleeding). Negative for vaginal discharge, difficulty urinating and dyspareunia.    Blood pressure 144/92, pulse 113, temperature 98.3 F (36.8 C), temperature source Oral, height 5\' 5"  (1.651 m), weight 76.885 kg (169 lb 8 oz), last menstrual period 05/15/2012.  Physical Exam Physical Exam  Constitutional: She appears well-developed and well-nourished. No distress.  Pulmonary/Chest: Effort normal. No respiratory distress.  Abdominal: Soft. She exhibits no distension and no mass.  Genitourinary: Vagina normal and uterus normal. No vaginal discharge found.       STD probe was obtained. No cervical motion tenderness. Uterus is normal size with no adnexal masses.  Skin: Skin is warm and dry.  Psychiatric: She has a normal mood and affect.    Data Reviewed *RADIOLOGY REPORT*  Clinical Data: Annular menses. Diagnosed with polycystic ovarian  syndrome in 2010. LMP 04/12/2012  TRANSABDOMINAL AND TRANSVAGINAL ULTRASOUND OF PELVIS  Technique: Both transabdominal and transvaginal ultrasound  examinations of the pelvis were  performed. Transabdominal technique  was performed for global imaging of the pelvis including uterus,  ovaries, adnexal regions, and pelvic cul-de-sac.  It was necessary to proceed with endovaginal exam following the  transabdominal exam to visualize the myometrium, endometrium and  adnexa.  Comparison: None  Findings:  Uterus: Demonstrates a sagittal length of 8.2 cm, AP depth of 4.0  cm and width of 5.0 cm. Two focal fibroids are identified one in  the anterior upper uterine segment demonstrating a small submucosal  component measuring 1.5 x 1.3 x 1.6 cm and the second in the  posterior upper uterine segment measuring 1.1 x 0.9 x 0.8 cm, mural  in location  Endometrium: Is thickened and mildly diffusely heterogeneous with a  width of 1.5 cm. No areas of focal thickening or obvious focal  lesions are evident. This would correlate with a presecretory  endometrium and correspond with the provided LMP of 04/12/2012. It  should be noted that small endometrial lesions could be obscured at  this phase of the cycle.  Right ovary: Measures 4.8 x 2.3 x 3.5 cm and contains a collapsing  unilocular simple cyst measuring 2.5 x 1.9 x 2.3 cm.  Left ovary: Measures 3.8 x 2.5 x 2.6 cm (12.8 cc) and demonstrates  multiple peripherally situated subcentimeter follicles.  Other findings: A trace of simple free fluid is noted in the cul-de-  sac  IMPRESSION:  Fibroid uterus with fibroid sizes and locations as noted above.  Thick slightly heterogeneous endometrial lining likely due to the  presecretory phase of the cycle in this premenopausal patient. No  definite focal lesions seen.  Sonographic appearance of the left ovary including slightly  prominent volume and multiple peripherally situated subcentimeter  follicles would correlate with the known history of polycystic  ovarian syndrome.  Collapsing simple unilocular right ovarian cyst may represent a  dominant follicle but is certainly benign.  Given the appearance,  no further follow-up is needed for this finding.  Original Report Authenticated By: Bertha Stakes, M.D.  CBC    Component Value Date/Time   WBC 4.6 05/09/2012 1216   RBC 5.04 05/09/2012 1216   HGB 13.8 05/09/2012 1216   HCT 40.7 05/09/2012 1216   PLT 266 05/09/2012 1216   MCV 80.8 05/09/2012 1216   MCH 27.4 05/09/2012 1216   MCHC 33.9 05/09/2012 1216   RDW 14.1 05/09/2012 1216   MONOABS 0.4 10/19/2007 1210   EOSABS 0.0 10/19/2007 1210   BASOSABS 0.0 10/19/2007 1210    Normal blood glucose Assessment    Polycystic ovary syndrome, infertility    Plan    She should continue her oral contraceptives for now. She has no indication to take metformin. She will consider coming back for an infertility visit. Her recommend that her partner have semen analysis. We discussed ovulation induction  with Clomid. I would recommend hysterosalpingogram as part of her evaluation.       ARNOLD,JAMES 06/15/2012, 3:15 PM

## 2012-07-11 ENCOUNTER — Ambulatory Visit (INDEPENDENT_AMBULATORY_CARE_PROVIDER_SITE_OTHER): Payer: Self-pay | Admitting: Family Medicine

## 2012-07-11 ENCOUNTER — Encounter: Payer: Self-pay | Admitting: Family Medicine

## 2012-07-11 VITALS — BP 138/96 | HR 96 | Temp 98.1°F | Ht 65.0 in | Wt 166.5 lb

## 2012-07-11 DIAGNOSIS — N760 Acute vaginitis: Secondary | ICD-10-CM

## 2012-07-11 DIAGNOSIS — R300 Dysuria: Secondary | ICD-10-CM

## 2012-07-11 DIAGNOSIS — N898 Other specified noninflammatory disorders of vagina: Secondary | ICD-10-CM | POA: Insufficient documentation

## 2012-07-11 LAB — POCT URINALYSIS DIPSTICK
Glucose, UA: NEGATIVE
Ketones, UA: NEGATIVE
Nitrite, UA: NEGATIVE
Spec Grav, UA: >=1.03
Urobilinogen, UA: 1
pH, UA: 5.5

## 2012-07-11 LAB — POCT WET PREP (WET MOUNT)

## 2012-07-11 LAB — POCT URINE PREGNANCY: Preg Test, Ur: NEGATIVE

## 2012-07-11 MED ORDER — FLUCONAZOLE 150 MG PO TABS: 150.0000 mg | ORAL_TABLET | Freq: Once | ORAL | Status: DC

## 2012-07-11 MED ORDER — FLUCONAZOLE 150 MG PO TABS: 150.0000 mg | ORAL_TABLET | Freq: Once | ORAL | Status: AC

## 2012-07-11 NOTE — Assessment & Plan Note (Signed)
Urinalysis is normal. For symptomatic relief, will treat with Diflucan and recommended AZO and cranberry juice. Red flags reviewed with patient that would require prompt return to clinic or ED.

## 2012-07-11 NOTE — Assessment & Plan Note (Signed)
On exam, weight thick discharge was found. Will treat with Diflucan x1. Awaiting results of wet prep and will treat as necessary.

## 2012-07-11 NOTE — Patient Instructions (Addendum)
Heather Bowman, your urine was normal. Pick up Diflucan at your pharmacy and take it today. For symptoms, you can take over the counter AZO and cranberry juice and plenty of water. We will call you with results of lab work today. If you develop worsening pain or symptoms, nausea/vomiting, or fever, please return to clinic or report to ED.

## 2012-07-11 NOTE — Progress Notes (Signed)
  Subjective:    Heather Bowman is a 31 y.o. female who complains of burning with urination, dysuria, foul smelling urine, suprapubic pressure and Vaginal discharge. Discharge is thick, white. She also complains of vaginal itching. She has had symptoms for 1 week. Patient also complains of vaginal discharge and pelvic pain. Patient denies fever, nausea/vomiting, chills, or night sweats.  She was recently checked for GC/Chlamydia one month ago which was negative. She has been sexually active with same partner for last 2 months, uses OCP for contraception.   Review of Systems Per HPI   Objective:    BP 138/96  Pulse 96  Temp 98.1 F (36.7 C) (Oral)  Ht 5\' 5"  (1.651 m)  Wt 166 lb 8 oz (75.524 kg)  BMI 27.71 kg/m2  LMP 05/15/2012 General appearance: alert, cooperative and mild distress Abdomen: soft, non-tender; bowel sounds normal; no masses,  no organomegaly Pelvic: external genitalia normal, no adnexal masses or tenderness, no bladder tenderness, no cervical motion tenderness and positive findings: white discharge    Assessm and on the leftent:   Dysuria; Vaginal Discharge  Plan:    See Problem List

## 2012-10-05 ENCOUNTER — Other Ambulatory Visit: Payer: Self-pay | Admitting: *Deleted

## 2012-10-07 MED ORDER — METFORMIN HCL 500 MG PO TABS: 500.0000 mg | ORAL_TABLET | Freq: Every day | ORAL | Status: DC

## 2012-10-09 ENCOUNTER — Other Ambulatory Visit: Payer: Self-pay | Admitting: *Deleted

## 2012-10-09 MED ORDER — METFORMIN HCL 500 MG PO TABS: 500.0000 mg | ORAL_TABLET | Freq: Every day | ORAL | Status: DC

## 2012-10-10 ENCOUNTER — Ambulatory Visit (INDEPENDENT_AMBULATORY_CARE_PROVIDER_SITE_OTHER): Payer: No Typology Code available for payment source | Admitting: Family Medicine

## 2012-10-10 ENCOUNTER — Encounter: Payer: Self-pay | Admitting: Family Medicine

## 2012-10-10 VITALS — BP 127/70 | HR 106 | Temp 99.7°F | Ht 65.0 in | Wt 163.9 lb

## 2012-10-10 DIAGNOSIS — M549 Dorsalgia, unspecified: Secondary | ICD-10-CM

## 2012-10-10 DIAGNOSIS — M545 Low back pain, unspecified: Secondary | ICD-10-CM

## 2012-10-10 MED ORDER — MELOXICAM 15 MG PO TABS: 15.0000 mg | ORAL_TABLET | Freq: Every day | ORAL | Status: DC

## 2012-10-10 MED ORDER — KETOROLAC TROMETHAMINE 60 MG/2ML IM SOLN: 60.0000 mg | Freq: Once | INTRAMUSCULAR | Status: DC

## 2012-10-10 NOTE — Assessment & Plan Note (Signed)
Likely MSK related injury - muscle spasm vs. Strain/sprain or overuse. Will treat conservatively with Mobic and Tizanidine x 14 days, heating pads PRN. If no improvement in 2 weeks, patient to return to clinic. Will consider imaging and/or referral to Pain Clinic or PT. Red flags reviewed.

## 2012-10-10 NOTE — Progress Notes (Signed)
  Subjective:    Patient ID: Heather Bowman, female    DOB: December 14, 1980, 31 y.o.   MRN: [id number]  HPI  Patient presents to clinic complaining of low back pain. Describes as sharp and tight like back is being compressed. Pain is intermittent, has been going on for about 2 years.   Pain worsened while she was working at [employer], but she does not work there anymore. Pain is worse with exertion, better with laying down. Patient sees a pain specialist for a neck injury/worker's comp injury.  For neck pain, she takes Tizanidine and Ibuprofen-Famotidine PRN.  This helps back pain also. Patient denies any recent trauma or injury to low back.  Denies any abnormal gait, bowel or bladder incontinence. + pain that shoots from low back down RT buttock and posterior thigh. Denies any GU symptoms - no dysuria, vaginal bleeding, discharge, or pelvic pain.   Review of Systems  Per HPI    Objective:   Physical Exam  Constitutional: No distress.  Musculoskeletal:       Arms:      Lumbar spine: no skin changes or erythema, full ROM flexion and extension, pain worse with extension, no bony tenderness; full hip ROM + SLR on LT and RT, worse on LT  Neurological: No cranial nerve deficit.       Normal gait.       Assessment & Plan:

## 2012-10-10 NOTE — Patient Instructions (Addendum)
For low back pain, stop using Ibuprofen for now and start Mobic 15 mg daily x 14 days. Continue Tizanidine as directed. Continue using heating pads, perform back stretches daily. If back pain does not improve in 2 weeks, return to clinic. Remember to call your doctor if you develop numbness/tingling of lower extremities, loss of bowel or bladder incontinence.  Back Pain, Adult Low back pain is very common. About 1 in 5 people have back pain.The cause of low back pain is rarely dangerous. The pain often gets better over time.About half of people with a sudden onset of back pain feel better in just 2 weeks. About 8 in 10 people feel better by 6 weeks.  CAUSES Some common causes of back pain include:  Strain of the muscles or ligaments supporting the spine.  Wear and tear (degeneration) of the spinal discs.  Arthritis.  Direct injury to the back. DIAGNOSIS Most of the time, the direct cause of low back pain is not known.However, back pain can be treated effectively even when the exact cause of the pain is unknown.Answering your caregiver's questions about your overall health and symptoms is one of the most accurate ways to make sure the cause of your pain is not dangerous. If your caregiver needs more information, he or she may order lab work or imaging tests (X-rays or MRIs).However, even if imaging tests show changes in your back, this usually does not require surgery. HOME CARE INSTRUCTIONS For many people, back pain returns.Since low back pain is rarely dangerous, it is often a condition that people can learn to [facility] their own.   Remain active. It is stressful on the back to sit or stand in one place. Do not sit, drive, or stand in one place for more than 30 minutes at a time. Take short walks on level surfaces as soon as pain allows.Try to increase the length of time you walk each day.  Do not stay in bed.Resting more than 1 or 2 days can delay your recovery.  Do not avoid  exercise or work.Your body is made to move.It is not dangerous to be active, even though your back may hurt.Your back will likely heal faster if you return to being active before your pain is gone.  Pay attention to your body when you bend and lift. Many people have less discomfortwhen lifting if they bend their knees, keep the load close to their bodies,and avoid twisting. Often, the most comfortable positions are those that put less stress on your recovering back.  Find a comfortable position to sleep. Use a firm mattress and lie on your side with your knees slightly bent. If you lie on your back, put a pillow under your knees.  Only take over-the-counter or prescription medicines as directed by your caregiver. Over-the-counter medicines to reduce pain and inflammation are often the most helpful.Your caregiver may prescribe muscle relaxant drugs.These medicines help dull your pain so you can more quickly return to your normal activities and healthy exercise.  Put ice on the injured area.  Put ice in a plastic bag.  Place a towel between your skin and the bag.  Leave the ice on for 15 to 20 minutes, 3 to 4 times a day for the first 2 to 3 days. After that, ice and heat may be alternated to reduce pain and spasms.  Ask your caregiver about trying back exercises and gentle massage. This may be of some benefit.  Avoid feeling anxious or stressed.Stress increases muscle tension  and can worsen back pain.It is important to recognize when you are anxious or stressed and learn ways to manage it.Exercise is a great option. SEEK MEDICAL CARE IF:  You have pain that is not relieved with rest or medicine.  You have pain that does not improve in 1 week.  You have new symptoms.  You are generally not feeling well. SEEK IMMEDIATE MEDICAL CARE IF:   You have pain that radiates from your back into your legs.  You develop new bowel or bladder control problems.  You have unusual weakness  or numbness in your arms or legs.  You develop nausea or vomiting.  You develop abdominal pain.  You feel faint. Document Released: 10/18/2005 Document Revised: 04/18/2012 Document Reviewed: 03/08/2011 [facility] Patient Information 2013 [place], [place].

## 2012-10-11 ENCOUNTER — Other Ambulatory Visit: Payer: Self-pay | Admitting: *Deleted

## 2012-10-11 DIAGNOSIS — E119 Type 2 diabetes mellitus without complications: Secondary | ICD-10-CM

## 2012-10-11 MED ORDER — METFORMIN HCL 500 MG PO TABS: 500.0000 mg | ORAL_TABLET | Freq: Every day | ORAL | Status: DC

## 2012-12-13 ENCOUNTER — Other Ambulatory Visit: Payer: Self-pay | Admitting: Family Medicine

## 2013-01-01 ENCOUNTER — Other Ambulatory Visit: Payer: Self-pay | Admitting: Family Medicine

## 2013-01-02 ENCOUNTER — Other Ambulatory Visit: Payer: Self-pay | Admitting: Family Medicine

## 2013-01-17 ENCOUNTER — Ambulatory Visit (HOSPITAL_COMMUNITY)
Admission: RE | Admit: 2013-01-17 | Discharge: 2013-01-17 | Disposition: A | Discharge status: Home / Self Care | Payer: No Typology Code available for payment source | Source: Ambulatory Visit | Attending: Family Medicine | Admitting: Family Medicine

## 2013-01-17 ENCOUNTER — Ambulatory Visit (INDEPENDENT_AMBULATORY_CARE_PROVIDER_SITE_OTHER): Payer: No Typology Code available for payment source | Admitting: Family Medicine

## 2013-01-17 ENCOUNTER — Encounter: Payer: Self-pay | Admitting: Family Medicine

## 2013-01-17 VITALS — BP 132/85 | HR 102 | Temp 99.7°F | Ht 65.0 in | Wt 162.0 lb

## 2013-01-17 DIAGNOSIS — M545 Low back pain, unspecified: Secondary | ICD-10-CM

## 2013-01-17 DIAGNOSIS — M549 Dorsalgia, unspecified: Secondary | ICD-10-CM

## 2013-01-17 DIAGNOSIS — R110 Nausea: Secondary | ICD-10-CM | POA: Insufficient documentation

## 2013-01-17 DIAGNOSIS — R109 Unspecified abdominal pain: Secondary | ICD-10-CM

## 2013-01-17 MED ORDER — ACYCLOVIR 5 % EX OINT: TOPICAL_OINTMENT | CUTANEOUS | Status: DC

## 2013-01-17 MED ORDER — PROMETHAZINE HCL 25 MG PO TABS: 25.0000 mg | ORAL_TABLET | Freq: Three times a day (TID) | ORAL | Status: DC | PRN

## 2013-01-17 MED ORDER — OMEPRAZOLE 40 MG PO CPDR: 40.0000 mg | DELAYED_RELEASE_CAPSULE | Freq: Every day | ORAL | Status: DC

## 2013-01-17 NOTE — Assessment & Plan Note (Signed)
No red flags or signs of saddle anesthesia.  Back pain may be secondary to chronic neck pain from previous injury at work.  Will check X-ray of lumbar and sacrum.  Follow up in one month.  Will refer to pain clinic or PT if pain does not improve.

## 2013-01-17 NOTE — Progress Notes (Signed)
  Subjective:    Patient ID: Heather Bowman, female    DOB: [birth year], 32 y.o.   MRN: [id number]  HPI  Patient presents to same day appointment with 2 issues:  Abdominal Pain/nausea:  Abdominal discomfort located above umbilicus. Radiates to low back at times. Has had GI issues in the past that started about 10 years ago, and was dx with IBS. Last colonoscopy in 2009, polyps were removed. Last GI specialist was Dr. Huston Foley. Takes Phenergan for nausea and Motrin 3 days per week. Takes Prilosec every other day (since 2011). Associated symptoms: nausea with vomited 3 times last week. Stools are non-bloody.  Low back pain: This has been going on for 5 years. She still sees a pain doctor for old injury from work. She gets Nucinta and Zanaflex for neck pain. Located tail bone and radiates from side to side. Worse with movement/walking. Pain wakes her up at night and she takes Zanaflex which helps. Not working at this time. Not physically activity due to pain. Denies any numbness/tingling of extremities. Denies any urinary retention or bowel incontinence.   Review of Systems Per HPI    Objective:   Physical Exam  Constitutional: She appears well-nourished. No distress.  Abdominal: Soft. Bowel sounds are normal. She exhibits no distension. There is no tenderness. There is no rebound and no guarding.  Musculoskeletal:       Lumbar back: She exhibits decreased range of motion. She exhibits no tenderness, no bony tenderness, no swelling, no edema and no deformity.  Mild TTP L5 to S2,  She can fully flex LS, but complains of pain with extension.  SLR negative.  Normal leg length.  No pelvic instability.     Assessment & Plan:

## 2013-01-17 NOTE — Assessment & Plan Note (Signed)
Nausea of unclear etiology.  Associated symptoms: abdominal discomfort, intermittent vomiting, and intermittent diarrhea - may be due to chronic GI disorder since she has had these symptoms since she was a teenage.  She used to see a GI specialist.  She agrees to see a GI doctor again.  Will place referral.

## 2013-01-17 NOTE — Patient Instructions (Addendum)
You can go to [facility] for X-rays after clinic today. Pick up your 3 prescriptions and use as directed. We will call you with time and date of GI appointment. Schedule follow up appointment with me in 1 month or sooner as needed.

## 2013-01-24 ENCOUNTER — Encounter: Payer: Self-pay | Admitting: Family Medicine

## 2013-03-07 ENCOUNTER — Other Ambulatory Visit (HOSPITAL_COMMUNITY)
Admission: RE | Admit: 2013-03-07 | Discharge: 2013-03-07 | Disposition: A | Discharge status: Home / Self Care | Payer: No Typology Code available for payment source | Source: Ambulatory Visit | Attending: Family Medicine | Admitting: Family Medicine

## 2013-03-07 ENCOUNTER — Ambulatory Visit (INDEPENDENT_AMBULATORY_CARE_PROVIDER_SITE_OTHER): Payer: No Typology Code available for payment source | Admitting: Family Medicine

## 2013-03-07 ENCOUNTER — Encounter: Payer: Self-pay | Admitting: Family Medicine

## 2013-03-07 VITALS — BP 126/88 | HR 101 | Temp 98.5°F | Ht 65.0 in | Wt 156.4 lb

## 2013-03-07 DIAGNOSIS — Z113 Encounter for screening for infections with a predominantly sexual mode of transmission: Secondary | ICD-10-CM | POA: Insufficient documentation

## 2013-03-07 DIAGNOSIS — N926 Irregular menstruation, unspecified: Secondary | ICD-10-CM

## 2013-03-07 DIAGNOSIS — N760 Acute vaginitis: Secondary | ICD-10-CM

## 2013-03-07 DIAGNOSIS — R300 Dysuria: Secondary | ICD-10-CM

## 2013-03-07 LAB — POCT WET PREP (WET MOUNT): Clue Cells Wet Prep Whiff POC: NEGATIVE

## 2013-03-07 LAB — POCT URINALYSIS DIPSTICK
Blood, UA: NEGATIVE
Glucose, UA: NEGATIVE
Nitrite, UA: NEGATIVE
Protein, UA: 30
Urobilinogen, UA: 0.2
pH, UA: 5.5

## 2013-03-07 LAB — POCT UA - MICROSCOPIC ONLY

## 2013-03-07 NOTE — Progress Notes (Signed)
Patient ID: Heather Bowman, female   DOB: [birth year], 32 y.o.   MRN: [id number] Subjective: The patient is a 32 y.o. year old female who presents today for dysuria.  Patient reports that she's been having problems with dysuria for the last week and half. She also reports dark colored urine and intermittent strong odor. She has slight vaginal discharge. Occasional pelvic pain. Occasional pain in back and sides. No fevers or chills. No urinary frequency. She has been taking a berry extract which has helped somewhat, however her symptoms are not completely better. She is a single female sexual partner and is not currently worried about his fidelity.  Patient's past medical, social, and family history were reviewed and updated as appropriate. History  Substance Use Topics  . Smoking status: Never Smoker   . Smokeless tobacco: Never Used  . Alcohol Use: Yes     Comment: on holidays or special occasions   Objective:  Filed Vitals:   03/07/13 1036  BP: 126/88  Pulse: 101  Temp: 98.5 F (36.9 C)   Gen: No acute distress, mildly anxious appearing Abd: Mild suprapubic tenderness, no flank tenderness Pelvic exam: normal external genitalia, vulva, vagina, cervix, uterus and adnexa.   Assessment/Plan:  Please also see individual problems in problem list for problem-specific plans.

## 2013-03-07 NOTE — Assessment & Plan Note (Signed)
Unclear etiology. Not obviously a urinary tract infection. I will send for culture however. Checked for gonorrhea and chlamydia today. If these are negative and the patient still has symptoms in another week, we will likely need to encounter the path of irritable bladder syndrome.

## 2013-03-07 NOTE — Addendum Note (Signed)
Addended by: [place], Horacio Werth on: 03/07/2013 01:18 PM   Modules accepted: Orders

## 2013-03-07 NOTE — Patient Instructions (Signed)
We will be in touch on Friday about results of your tests. I would recommend that you continue your berry extract and take it every day for a week. Increase how much water you are drinking.

## 2013-03-09 ENCOUNTER — Telehealth: Payer: Self-pay | Admitting: Family Medicine

## 2013-03-09 MED ORDER — CEPHALEXIN 500 MG PO CAPS: 500.0000 mg | ORAL_CAPSULE | Freq: Three times a day (TID) | ORAL | Status: DC

## 2013-03-09 NOTE — Telephone Encounter (Signed)
Please let patient know that it looks like she has a very mild UTI but does not have an STD.  I have sent in an antibiotic to take 3 times per day for a week for the UTI.

## 2013-03-09 NOTE — Telephone Encounter (Signed)
Called patient and informed her of below.

## 2013-04-04 ENCOUNTER — Other Ambulatory Visit: Payer: Self-pay | Admitting: *Deleted

## 2013-04-04 ENCOUNTER — Other Ambulatory Visit: Payer: Self-pay | Admitting: Family Medicine

## 2013-04-04 MED ORDER — METFORMIN HCL 500 MG PO TABS: ORAL_TABLET | ORAL | Status: DC

## 2013-04-04 NOTE — Telephone Encounter (Signed)
Requested Prescriptions   Pending Prescriptions Disp Refills  . metFORMIN (GLUCOPHAGE) 500 MG tablet 30 tablet 2

## 2013-05-29 ENCOUNTER — Other Ambulatory Visit: Payer: Self-pay | Admitting: *Deleted

## 2013-05-29 MED ORDER — PROMETHAZINE HCL 25 MG PO TABS: 25.0000 mg | ORAL_TABLET | Freq: Three times a day (TID) | ORAL | Status: DC | PRN

## 2013-06-01 ENCOUNTER — Other Ambulatory Visit: Payer: Self-pay | Admitting: *Deleted

## 2013-06-04 MED ORDER — PROMETHAZINE HCL 25 MG PO TABS: 25.0000 mg | ORAL_TABLET | Freq: Three times a day (TID) | ORAL | Status: DC | PRN

## 2013-06-13 ENCOUNTER — Encounter: Payer: Self-pay | Admitting: Family Medicine

## 2013-06-13 ENCOUNTER — Ambulatory Visit (INDEPENDENT_AMBULATORY_CARE_PROVIDER_SITE_OTHER): Payer: No Typology Code available for payment source | Admitting: Family Medicine

## 2013-06-13 VITALS — BP 112/68 | HR 97 | Temp 98.7°F | Ht 65.0 in | Wt 156.1 lb

## 2013-06-13 DIAGNOSIS — N920 Excessive and frequent menstruation with regular cycle: Secondary | ICD-10-CM

## 2013-06-13 DIAGNOSIS — N921 Excessive and frequent menstruation with irregular cycle: Secondary | ICD-10-CM

## 2013-06-13 DIAGNOSIS — R61 Generalized hyperhidrosis: Secondary | ICD-10-CM

## 2013-06-13 LAB — POCT URINALYSIS DIPSTICK
Bilirubin, UA: NEGATIVE
Glucose, UA: NEGATIVE
Spec Grav, UA: >=1.03

## 2013-06-13 LAB — CBC
Hemoglobin: 13.5 g/dL (ref 12.0–15.0)
MCH: 28.5 pg (ref 26.0–34.0)
MCV: 83.9 fL (ref 78.0–100.0)
RBC: 4.73 MIL/uL (ref 3.87–5.11)
WBC: 5.3 10*3/uL (ref 4.0–10.5)

## 2013-06-13 LAB — POCT UA - MICROSCOPIC ONLY

## 2013-06-13 MED ORDER — NORGESTIMATE-ETH ESTRADIOL 0.25-35 MG-MCG PO TABS: 1.0000 | ORAL_TABLET | Freq: Every day | ORAL | Status: DC

## 2013-06-13 NOTE — Progress Notes (Signed)
  Subjective:    Patient ID: Heather Bowman, female    DOB: [birth year], 32 y.o.   MRN: [id number]  HPI Comments: Heather Bowman came in with a 2 month history of irregular bleeding. She is currently taking a birth control pill of which she does not remember the name. She has missed a few doses at times but does not know if they correlate to beginning of irregular menses. She has recorded menstruation episodes from June 10-19th, June 24-30th, July 1-3 and July 20-29. She has back pain associated with her menses which is normal. She currently has sex with condoms with last coitus on July 31st. She has not noticed any change in vaginal secretions. She has also had a two year history of sweating, palpitations and nausea. Her nausea and palpitations occur infrequently, while her sweating occurs more consistantly. She has no history of thyroid problems. She has been feeling anxious.      Review of Systems  Constitutional: Positive for diaphoresis. Negative for fever and fatigue.  Endocrine: Positive for heat intolerance. Negative for cold intolerance and polydipsia.  Genitourinary: Positive for frequency, vaginal bleeding and menstrual problem. Negative for dysuria, urgency, vaginal discharge, vaginal pain, pelvic pain and dyspareunia.  Musculoskeletal: Positive for back pain. Negative for arthralgias.  Psychiatric/Behavioral: The patient is nervous/anxious.        Objective:   Physical Exam  Constitutional: She is oriented to person, place, and time.  Eyes:  No exophthalmos   Neck: Normal range of motion. Neck supple. No thyromegaly present.  Cardiovascular: Normal rate and regular rhythm.   No murmur heard. Pulmonary/Chest: Effort normal and breath sounds normal.  Neurological: She is alert and oriented to person, place, and time.          Assessment & Plan:

## 2013-06-13 NOTE — Patient Instructions (Addendum)
Hi Ms. Panozzo, it was a pleasure meeting you today. Today we spoke about your abnormal uterine bleeding. We decided to start your on Sprintec. In your menstrual diary, please include: 1. The days you take/miss your birth control pill 2. The days you are spotting 3. Days you are bleeding 4. Heavy/light menstruation  We also talked about your sweating, palpitations and nausea/vomiting episodes. We are going to test your blood and check your TSH and T4 which will tell us about your thyroid function. This could be anxiety related also and talked about methods to calm you down.  Lastly, we spoke about your overdue pap smear. You can come back in one month to have your pap smear, and discuss any changes in your current symptoms. Thank you for your visit. If you have any questions, please do not hesitate to call our office.  Jacquelin Hawking, MD

## 2013-06-13 NOTE — Assessment & Plan Note (Signed)
Currently not using birth control as directed. Most likely cause of irregular bleeding. Will start patient on Sprintec and counseled her on importance of compliance. Will order a CBC and follow-up.

## 2013-06-19 ENCOUNTER — Ambulatory Visit: Payer: No Typology Code available for payment source

## 2013-06-22 ENCOUNTER — Ambulatory Visit: Payer: Self-pay

## 2013-07-03 ENCOUNTER — Other Ambulatory Visit: Payer: Self-pay | Admitting: *Deleted

## 2013-07-03 MED ORDER — METFORMIN HCL 500 MG PO TABS: ORAL_TABLET | ORAL | Status: DC

## 2013-08-07 ENCOUNTER — Ambulatory Visit (INDEPENDENT_AMBULATORY_CARE_PROVIDER_SITE_OTHER): Payer: Self-pay | Admitting: Family Medicine

## 2013-08-07 ENCOUNTER — Other Ambulatory Visit (HOSPITAL_COMMUNITY)
Admission: RE | Admit: 2013-08-07 | Discharge: 2013-08-07 | Disposition: A | Discharge status: Home / Self Care | Payer: No Typology Code available for payment source | Source: Ambulatory Visit | Attending: Family Medicine | Admitting: Family Medicine

## 2013-08-07 ENCOUNTER — Ambulatory Visit (HOSPITAL_COMMUNITY)
Admission: RE | Admit: 2013-08-07 | Discharge: 2013-08-07 | Disposition: A | Discharge status: Home / Self Care | Payer: No Typology Code available for payment source | Source: Ambulatory Visit | Attending: Family Medicine | Admitting: Family Medicine

## 2013-08-07 ENCOUNTER — Encounter: Payer: Self-pay | Admitting: Family Medicine

## 2013-08-07 VITALS — BP 120/75 | HR 94 | Temp 99.2°F | Ht 65.0 in | Wt 161.0 lb

## 2013-08-07 DIAGNOSIS — Z124 Encounter for screening for malignant neoplasm of cervix: Secondary | ICD-10-CM

## 2013-08-07 DIAGNOSIS — R599 Enlarged lymph nodes, unspecified: Secondary | ICD-10-CM | POA: Insufficient documentation

## 2013-08-07 DIAGNOSIS — I889 Nonspecific lymphadenitis, unspecified: Secondary | ICD-10-CM

## 2013-08-07 DIAGNOSIS — Z1151 Encounter for screening for human papillomavirus (HPV): Secondary | ICD-10-CM | POA: Insufficient documentation

## 2013-08-07 DIAGNOSIS — Z01419 Encounter for gynecological examination (general) (routine) without abnormal findings: Secondary | ICD-10-CM | POA: Insufficient documentation

## 2013-08-07 LAB — HIV ANTIBODY (ROUTINE TESTING W REFLEX): HIV: NONREACTIVE

## 2013-08-07 MED ORDER — SULFAMETHOXAZOLE-TMP DS 800-160 MG PO TABS: 1.0000 | ORAL_TABLET | Freq: Two times a day (BID) | ORAL | Status: DC

## 2013-08-07 NOTE — Patient Instructions (Signed)
Ryann, it was nice meeting you today.  Please take the antibiotic as prescribed and we will see you back in 2 weeks.  Thanks, Dr. Paulina Fusi

## 2013-08-07 NOTE — Assessment & Plan Note (Signed)
Pt with ongoing fluctuant R axillary lymphadenitis that comes and goes, unrelated to shaving or deodarant.  Since she has had ongoing issues will get some viral labs/CXR and she has already a CBC, which was normal one month ago.  She does not have any red flags including fever, night sweats, fatigue, weight loss, or breast mass and a breast exam is normal.  Pending lab eval, will also start on bactrim x 10 days to see if this improves her Sx.  If her laboratory exam is normal and she does not have improvement on the bactrim, will consider further w/u with [place] to evaluate the area.

## 2013-08-07 NOTE — Assessment & Plan Note (Signed)
Last Pap > 3 yrs ago.  Will screen today with another Pap and if DNA HPV is negative, can rescreen in 3-5 yrs.

## 2013-08-07 NOTE — Progress Notes (Signed)
Heather Bowman is a 32 y.o. who presents today for well check for PAP smear and R axillary swelling.  R axillary swelling - Pt has swelling located in the R axillary region that has been intermittent over the past year.  Pt does not remember an inciting cause and noticed some swelling in that area at that time that resolved on its own.  It is painful to touch when it becomes enlarged, sometimes turn erythematous, but denies any drainage.  Is unrelated to deodarant, soaps, lotions, or shaving as pt has tried multiple times to change her regimen w/o relief.  Fluctuant at times, but has not noticed any associated Sx or anything that improves or dissipates the edema.  Denies any fevers, weight loss, chills, breast masses or lumps or nipple drainage, night sweats.  No associated Sx on the L side or any LAD elsewhere.   Past Medical History  Diagnosis Date  . Anxiety   . Arthritis   . GERD (gastroesophageal reflux disease)   . Depression   . Chronic pain syndrome     History   Social History  . Marital Status: Single    Spouse Name: N/A    Number of Children: N/A  . Years of Education: N/A   Occupational History  . Not on file.   Social History Main Topics  . Smoking status: Never Smoker   . Smokeless tobacco: Never Used  . Alcohol Use: Yes     Comment: on holidays or special occasions  . Drug Use: No  . Sexual Activity: Yes    Birth Control/ Protection: None     Comment: one partner for over 4 years, patient says she is infertile   Other Topics Concern  . Not on file   Social History Narrative  . No narrative on file    Family History  Problem Relation Age of Onset  . Heart disease Father   . Diabetes Father   . Hyperlipidemia Father   . Hypertension Father   . Stroke Father   . Depression Sister   . Diabetes Sister   . Hyperlipidemia Sister   . Hypertension Sister   . Asthma Brother   . Cancer Maternal Grandmother     Current Outpatient Prescriptions on File Prior  to Visit  Medication Sig Dispense Refill  . acyclovir ointment (ZOVIRAX) 5 % Apply topically every 3 (three) hours. Do this for 5 days.  5 g  1  . buPROPion (WELLBUTRIN XL) 150 MG 24 hr tablet Take 1 tablet (150 mg total) by mouth daily.  30 tablet  0  . docusate sodium (COLACE) 100 MG capsule Take 100 mg by mouth as needed.      . Ibuprofen-Famotidine 800-26.6 MG TABS Take 1 tablet by mouth every other day.  90 tablet  0  . meloxicam (MOBIC) 15 MG tablet Take 1 tablet (15 mg total) by mouth daily.  15 tablet  0  . metFORMIN (GLUCOPHAGE) 500 MG tablet TAKE 1 TABLET BY MOUTH DAILY WITH BREAKFAST  30 tablet  2  . norgestimate-ethinyl estradiol (SPRINTEC 28) 0.25-35 MG-MCG tablet Take 1 tablet by mouth daily.  1 Package  11  . omeprazole (PRILOSEC) 40 MG capsule Take 1 capsule (40 mg total) by mouth daily.  30 capsule  3  . promethazine (PHENERGAN) 25 MG tablet Take 1 tablet (25 mg total) by mouth every 8 (eight) hours as needed for nausea.  90 tablet  0  . Tapentadol HCl 75 MG TABS Take 1  tablet (75 mg total) by mouth every 8 (eight) hours as needed.    0  . tizanidine (ZANAFLEX) 2 MG capsule Take 1 capsule (2 mg total) by mouth 2 (two) times daily.  30 capsule  0   No current facility-administered medications on file prior to visit.    Patient Information Form: Screening and ROS  AUDIT-C Score: 0 Do you feel safe in relationships? yes  Review of Symptoms  General:  Negative for nexplained weight loss, fever Skin: Negative for new or changing mole, sore that won't heal HEENT: Negative for trouble hearing, trouble seeing, ringing in ears, mouth sores, hoarseness, change in voice, dysphagia. CV:  Negative for chest pain, dyspnea, edema, palpitations Resp: Negative for cough, dyspnea, hemoptysis GI: Negative for nausea, vomiting, diarrhea, constipation, abdominal pain, melena, hematochezia. GU: Negative for dysuria, incontinence, urinary hesitance, hematuria, vaginal or penile discharge,  polyuria, sexual difficulty, lumps in testicle or breasts MSK: Negative for muscle cramps or aches, joint pain or swelling Neuro: Negative for headaches, weakness, numbness, dizziness, passing out/fainting Psych: Negative for depression, anxiety, memory problems  Physical Exam Filed Vitals:   08/07/13 0943  BP: 120/75  Pulse: 94  Temp: 99.2 F (37.3 C)   Female exam performed with Alvino Chapel Proposito Gen: NAD, Well nourished, Well developed HEENT: EOMI, [place]/AT Neck: no JVD, no LAD Lymph: + R axillary lymphadenitis.  No subclavian/supraclavicular, epicondylar LAD.   Breast: No masses or lumps palpated, normal appearing breasts w/o erythema.  No galactorrhea  Cardio: RRR, No murmurs appreciated  Lungs: CTA, no wheezes, rhonchi, crackles Abd: NABS, soft nontender nondistended MSK: ROM normal  NeuroVascular: CN 2-12 intact, MS 5/5 B/L UE and LE, +2 patellar and achilles relfex b/l. DP/PT +2 B/L LE  Psych: AAO x 3 Gyn: Normal appearing external/internal genitalia.  Cervix visualized and w/o erythema or strawberry appearance.    Lab Results  Component Value Date   WBC 5.3 06/13/2013   HGB 13.5 06/13/2013   HCT 39.7 06/13/2013   MCV 83.9 06/13/2013   PLT 232 06/13/2013   Lab Results  Component Value Date   TSH 1.195 06/13/2013

## 2013-08-08 LAB — EBV AB TO VIRAL CAPSID AG PNL, IGG+IGM: EBV VCA IgM: <10 U/mL (ref <36.0)

## 2013-08-09 LAB — CYTOMEGALOVIRUS ANTIBODY, IGG: Cytomegalovirus Ab-IgG: <0.2 U/mL (ref <0.60)

## 2013-08-10 ENCOUNTER — Encounter: Payer: Self-pay | Admitting: Family Medicine

## 2013-08-22 ENCOUNTER — Encounter: Payer: Self-pay | Admitting: Family Medicine

## 2013-08-22 ENCOUNTER — Ambulatory Visit (INDEPENDENT_AMBULATORY_CARE_PROVIDER_SITE_OTHER): Payer: No Typology Code available for payment source | Admitting: Family Medicine

## 2013-08-22 VITALS — BP 122/88 | HR 111 | Temp 97.9°F | Ht 65.0 in | Wt 161.9 lb

## 2013-08-22 DIAGNOSIS — M545 Low back pain, unspecified: Secondary | ICD-10-CM

## 2013-08-22 DIAGNOSIS — I889 Nonspecific lymphadenitis, unspecified: Secondary | ICD-10-CM

## 2013-08-22 NOTE — Patient Instructions (Signed)
Back Pain, Adult  Low back pain is very common. About 1 in 5 people have back pain. The cause of low back pain is rarely dangerous. The pain often gets better over time. About half of people with a sudden onset of back pain feel better in just 2 weeks. About 8 in 10 people feel better by 6 weeks.   CAUSES  Some common causes of back pain include:  · Strain of the muscles or ligaments supporting the spine.  · Wear and tear (degeneration) of the spinal discs.  · Arthritis.  · Direct injury to the back.  DIAGNOSIS  Most of the time, the direct cause of low back pain is not known. However, back pain can be treated effectively even when the exact cause of the pain is unknown. Answering your caregiver's questions about your overall health and symptoms is one of the most accurate ways to make sure the cause of your pain is not dangerous. If your caregiver needs more information, he or she may order lab work or imaging tests (X-rays or MRIs). However, even if imaging tests show changes in your back, this usually does not require surgery.  HOME CARE INSTRUCTIONS  For many people, back pain returns. Since low back pain is rarely dangerous, it is often a condition that people can learn to manage on their own.   · Remain active. It is stressful on the back to sit or stand in one place. Do not sit, drive, or stand in one place for more than 30 minutes at a time. Take short walks on level surfaces as soon as pain allows. Try to increase the length of time you walk each day.  · Do not stay in bed. Resting more than 1 or 2 days can delay your recovery.  · Do not avoid exercise or work. Your body is made to move. It is not dangerous to be active, even though your back may hurt. Your back will likely heal faster if you return to being active before your pain is gone.  · Pay attention to your body when you  bend and lift. Many people have less discomfort when lifting if they bend their knees, keep the load close to their bodies, and  avoid twisting. Often, the most comfortable positions are those that put less stress on your recovering back.  · Find a comfortable position to sleep. Use a firm mattress and lie on your side with your knees slightly bent. If you lie on your back, put a pillow under your knees.  · Only take over-the-counter or prescription medicines as directed by your caregiver. Over-the-counter medicines to reduce pain and inflammation are often the most helpful. Your caregiver may prescribe muscle relaxant drugs. These medicines help dull your pain so you can more quickly return to your normal activities and healthy exercise.  · Put ice on the injured area.  · Put ice in a plastic bag.  · Place a towel between your skin and the bag.  · Leave the ice on for 15-20 minutes, 3-4 times a day for the first 2 to 3 days. After that, ice and heat may be alternated to reduce pain and spasms.  · Ask your caregiver about trying back exercises and gentle massage. This may be of some benefit.  · Avoid feeling anxious or stressed. Stress increases muscle tension and can worsen back pain. It is important to recognize when you are anxious or stressed and learn ways to manage it. Exercise is a great option.  SEEK MEDICAL CARE IF:  · You have pain that is not relieved with rest or   medicine.  · You have pain that does not improve in 1 week.  · You have new symptoms.  · You are generally not feeling well.  SEEK IMMEDIATE MEDICAL CARE IF:   · You have pain that radiates from your back into your legs.  · You develop new bowel or bladder control problems.  · You have unusual weakness or numbness in your arms or legs.  · You develop nausea or vomiting.  · You develop abdominal pain.  · You feel faint.  Document Released: 10/18/2005 Document Revised: 04/18/2012 Document Reviewed: 03/08/2011  [employer]® Patient Information ©2014 [employer].

## 2013-08-22 NOTE — Assessment & Plan Note (Signed)
Pt with continual mechanical back pain for the last two-three yrs s/p injury at work.  She is tender off the generalized lumbar spine, no midline TTP, no bowel/bladder incontinence, no weakness, no fever, no chills, no nightsweats, no unintentional weight loss or nighttime awakenings.  Her exam is consistent with sciatic type pain, that does not indicate herniated disc or spinal stenosis.  She is very adamant about having an MRI performed to evaluate for what is causing this.  Explained benefits and risks of this including the MRI being normal and the cost and she would like to proceed with this prior to physical therapy.  If negative, would like to consider physical therapy specifically for core strengthening.  Continue with ibuprofen for now, muscle relaxer if needed, and may be of benefit from pain management for possible epidural injections if no improvement.  F/U in three months or sooner as needed.

## 2013-08-22 NOTE — Assessment & Plan Note (Addendum)
She has not seen improvement despite bactrim, and hygiene practices.  Her exam is consistent with last visit.  At this point, will get [place] to evaluate for consistency of region for possible excision vs biopsy or drainage.  Labwork reviewed today and negative for acute EBV, CMV, or HIV.

## 2013-08-22 NOTE — Progress Notes (Signed)
Heather Bowman is a 32 y.o. who presents today for low back pain and R axillary lymphadenitis   Low Back Pain - Pt with continual mechanical back pain for the last two-three yrs s/p injury at work when lifting too much.  Was originally given steroid burst and flexeril which helped alleviate her pain.  Since that point, she has continued to have low back pain intermittently that fluctuates with her activity.   She is tender off the generalized lumbar spine, no midline TTP, no bowel/bladder incontinence, no weakness, no fever, no chills, no nightsweats, no unintentional weight loss or nighttime awakenings.  She has not tried physical therapy for her low back in the past and does take some ibuprofen 800 mg which does help.    R Axillary Lymphadenitis - Pt seen two weeks ago for R axillary lump.  Had multiple lab tests and CXR which were normal, and completed ABx of Bactrim x 10 days with minimal improvement. No fever, chills, or sweats.    Past Medical History  Diagnosis Date  . Anxiety   . Arthritis   . GERD (gastroesophageal reflux disease)   . Depression   . Chronic pain syndrome      Physical Exam Filed Vitals:   08/22/13 1509  BP: 122/88  Pulse: 111  Temp: 97.9 F (36.6 C)    Gen: NAD, Well nourished, Well developed Cardio: RRR, No murmurs/gallops/rubs Lungs: CTA, no wheezes, rhonchi, crackles Neuro: CN 2-12 intact, MS 5/5 B/L UE and LE, +2 patellar and achilles relfex b/l  Back Exam: 1.Gait  1. Walk on heels (L5 root) yes  Walk on toes (S1 root) yes 2. TTP along Lumbar Vertebrae - no 3. Pain with :   1) Extension - yes   2) Flexion - yes 4. One Legged Hyperextension for Spondy - no  5. Straight Leg Raise yes  @ 30-45 Degrees B/L   1) Radiation into opposite leg - no   2) Worse with Dorsiflexion of ankle yes  7. DTR - + 2 b/l LE  8. MS - 5/5 B/L LE  9. Vascular Exam : DP and PT +2 B/L  R axillary Region - Lymph: + R axillary lymphadenitis. No subclavian/supraclavicular,  epicondylar LAD.   Greater than 50% of the 25 minute visit was spent face to face discussing options, management, and evaluation of her conditions.

## 2013-08-28 ENCOUNTER — Telehealth: Payer: Self-pay | Admitting: Family Medicine

## 2013-08-28 NOTE — Telephone Encounter (Signed)
Would like the one day pill for yeast infection  Ph[facility]: [facility] [address] [facility]

## 2013-08-29 MED ORDER — FLUCONAZOLE 150 MG PO TABS: 150.0000 mg | ORAL_TABLET | Freq: Once | ORAL | Status: DC

## 2013-08-29 NOTE — Telephone Encounter (Signed)
Spoke with patient and informed her of below 

## 2013-08-29 NOTE — Telephone Encounter (Signed)
Completed and sent in.  Twana First Paulina Fusi, DO of [facility] 08/29/2013, 11:48 AM

## 2013-08-30 ENCOUNTER — Other Ambulatory Visit: Payer: Self-pay | Admitting: Family Medicine

## 2013-08-30 ENCOUNTER — Ambulatory Visit (HOSPITAL_COMMUNITY)
Admission: RE | Admit: 2013-08-30 | Discharge: 2013-08-30 | Disposition: A | Discharge status: Home / Self Care | Payer: No Typology Code available for payment source | Source: Ambulatory Visit | Attending: Family Medicine | Admitting: Family Medicine

## 2013-08-30 DIAGNOSIS — M79609 Pain in unspecified limb: Secondary | ICD-10-CM | POA: Insufficient documentation

## 2013-08-30 DIAGNOSIS — I889 Nonspecific lymphadenitis, unspecified: Secondary | ICD-10-CM

## 2013-08-30 DIAGNOSIS — M549 Dorsalgia, unspecified: Secondary | ICD-10-CM | POA: Insufficient documentation

## 2013-08-30 DIAGNOSIS — M7989 Other specified soft tissue disorders: Secondary | ICD-10-CM | POA: Insufficient documentation

## 2013-08-30 DIAGNOSIS — M545 Low back pain, unspecified: Secondary | ICD-10-CM

## 2013-08-31 ENCOUNTER — Encounter: Payer: Self-pay | Admitting: Family Medicine

## 2013-09-20 ENCOUNTER — Other Ambulatory Visit: Payer: Self-pay | Admitting: Family Medicine

## 2013-09-20 MED ORDER — PROMETHAZINE HCL 25 MG PO TABS: 25.0000 mg | ORAL_TABLET | Freq: Three times a day (TID) | ORAL | Status: DC | PRN

## 2013-09-24 ENCOUNTER — Other Ambulatory Visit: Payer: Self-pay | Admitting: Family Medicine

## 2013-11-08 ENCOUNTER — Ambulatory Visit (INDEPENDENT_AMBULATORY_CARE_PROVIDER_SITE_OTHER): Payer: No Typology Code available for payment source | Admitting: Family Medicine

## 2013-11-08 VITALS — BP 131/73 | HR 96 | Temp 98.1°F | Ht 65.0 in | Wt 160.0 lb

## 2013-11-08 DIAGNOSIS — R350 Frequency of micturition: Secondary | ICD-10-CM

## 2013-11-08 DIAGNOSIS — N390 Urinary tract infection, site not specified: Secondary | ICD-10-CM

## 2013-11-08 LAB — POCT UA - MICROSCOPIC ONLY

## 2013-11-08 LAB — POCT URINALYSIS DIPSTICK
BILIRUBIN UA: NEGATIVE
Glucose, UA: NEGATIVE
KETONES UA: NEGATIVE
Nitrite, UA: POSITIVE
PH UA: 6
Spec Grav, UA: 1.02
Urobilinogen, UA: 0.2

## 2013-11-08 MED ORDER — CEPHALEXIN 500 MG PO CAPS: 500.0000 mg | ORAL_CAPSULE | Freq: Three times a day (TID) | ORAL | Status: DC

## 2013-11-08 NOTE — Patient Instructions (Signed)
Urinary Tract Infection  Urinary tract infections (UTIs) can develop anywhere along your urinary tract. Your urinary tract is your body's drainage system for removing wastes and extra water. Your urinary tract includes two kidneys, two ureters, a bladder, and a urethra. Your kidneys are a pair of bean-shaped organs. Each kidney is about the size of your fist. They are located below your ribs, one on each side of your spine.  CAUSES  Infections are caused by microbes, which are microscopic organisms, including fungi, viruses, and bacteria. These organisms are so small that they can only be seen through a microscope. Bacteria are the microbes that most commonly cause UTIs.  SYMPTOMS   Symptoms of UTIs may vary by age and gender of the patient and by the location of the infection. Symptoms in young women typically include a frequent and intense urge to urinate and a painful, burning feeling in the bladder or urethra during urination. Older women and men are more likely to be tired, shaky, and weak and have muscle aches and abdominal pain. A fever may mean the infection is in your kidneys. Other symptoms of a kidney infection include pain in your back or sides below the ribs, nausea, and vomiting.  DIAGNOSIS  To diagnose a UTI, your caregiver will ask you about your symptoms. Your caregiver also will ask to provide a urine sample. The urine sample will be tested for bacteria and white blood cells. White blood cells are made by your body to help fight infection.  TREATMENT   Typically, UTIs can be treated with medication. Because most UTIs are caused by a bacterial infection, they usually can be treated with the use of antibiotics. The choice of antibiotic and length of treatment depend on your symptoms and the type of bacteria causing your infection.  HOME CARE INSTRUCTIONS   If you were prescribed antibiotics, take them exactly as your caregiver instructs you. Finish the medication even if you feel better after you  have only taken some of the medication.   Drink enough water and fluids to keep your urine clear or pale yellow.   Avoid caffeine, tea, and carbonated beverages. They tend to irritate your bladder.   Empty your bladder often. Avoid holding urine for long periods of time.   Empty your bladder before and after sexual intercourse.   After a bowel movement, women should cleanse from front to back. Use each tissue only once.  SEEK MEDICAL CARE IF:    You have back pain.   You develop a fever.   Your symptoms do not begin to resolve within 3 days.  SEEK IMMEDIATE MEDICAL CARE IF:    You have severe back pain or lower abdominal pain.   You develop chills.   You have nausea or vomiting.   You have continued burning or discomfort with urination.  MAKE SURE YOU:    Understand these instructions.   Will watch your condition.   Will get help right away if you are not doing well or get worse.  Document Released: 07/28/2005 Document Revised: 04/18/2012 Document Reviewed: 11/26/2011  ExitCare Patient Information 2014 [employer].

## 2013-11-08 NOTE — Progress Notes (Signed)
Family Medicine Office Visit Note   Subjective:   Patient ID: Heather Bowman, female  DOB: 03/17/1981, 33 y.o.. MRN: [id number]   Pt that comes today for same day appointment complaining off dysuria and frequency for about one week. She reports has used Pyridium but her symptoms have not improved. Patient denies fever nausea, vomiting or flank pain. She noticed this started after having sex. She denies other recently episodes.    Review of Systems:  Pt denies SOB, chest pain, palpitations, headaches, dizziness, numbness or weakness. No unintentional weigh loss/gain.  Objective:   Physical Exam: Gen:  NAD HEENT: Moist mucous membranes  CV: Regular rate and rhythm, no murmurs PULM: Clear to auscultation bilaterally.  ABD: Soft, non tender, non distended, normal bowel sounds. No CVA tenderness. EXT: No edema Neuro: Alert and oriented x3.   Assessment & Plan:

## 2013-11-09 DIAGNOSIS — N390 Urinary tract infection, site not specified: Secondary | ICD-10-CM | POA: Insufficient documentation

## 2013-11-09 NOTE — Assessment & Plan Note (Signed)
Dysuria and frequency, UA positive for nitrates and leukocytes. No flank pain, fever or other signs that suggest pylo. Will treat as uncomplicated UTI and f/u as needed.

## 2013-11-14 ENCOUNTER — Ambulatory Visit: Payer: No Typology Code available for payment source

## 2014-03-08 ENCOUNTER — Telehealth: Payer: Self-pay | Admitting: Family Medicine

## 2014-03-08 ENCOUNTER — Other Ambulatory Visit: Payer: Self-pay | Admitting: *Deleted

## 2014-03-08 MED ORDER — PROMETHAZINE HCL 25 MG PO TABS: 25.0000 mg | ORAL_TABLET | Freq: Three times a day (TID) | ORAL | Status: DC | PRN

## 2014-03-08 NOTE — Telephone Encounter (Signed)
Sent in

## 2014-03-08 NOTE — Telephone Encounter (Signed)
Pt called and needs a refill on her Phenergan called in to [facility]. jw

## 2014-04-15 ENCOUNTER — Ambulatory Visit (INDEPENDENT_AMBULATORY_CARE_PROVIDER_SITE_OTHER): Payer: No Typology Code available for payment source | Admitting: Family Medicine

## 2014-04-15 ENCOUNTER — Encounter: Payer: Self-pay | Admitting: Family Medicine

## 2014-04-15 VITALS — BP 118/78 | HR 88 | Temp 98.1°F | Ht 65.0 in | Wt 161.0 lb

## 2014-04-15 DIAGNOSIS — R300 Dysuria: Secondary | ICD-10-CM

## 2014-04-15 DIAGNOSIS — N390 Urinary tract infection, site not specified: Secondary | ICD-10-CM

## 2014-04-15 LAB — POCT URINALYSIS DIPSTICK
Glucose, UA: NEGATIVE
KETONES UA: 15
LEUKOCYTES UA: NEGATIVE
Nitrite, UA: POSITIVE
PROTEIN UA: 30
Spec Grav, UA: >=1.03
Urobilinogen, UA: 1
pH, UA: 6

## 2014-04-15 LAB — POCT UA - MICROSCOPIC ONLY

## 2014-04-15 MED ORDER — NAPROXEN 500 MG PO TABS: 500.0000 mg | ORAL_TABLET | Freq: Two times a day (BID) | ORAL | Status: DC

## 2014-04-15 MED ORDER — CEPHALEXIN 500 MG PO CAPS: 500.0000 mg | ORAL_CAPSULE | Freq: Three times a day (TID) | ORAL | Status: DC

## 2014-04-15 MED ORDER — BACITRACIN 500 UNIT/GM EX OINT: 1.0000 "application " | TOPICAL_OINTMENT | Freq: Two times a day (BID) | CUTANEOUS | Status: DC

## 2014-04-15 NOTE — Patient Instructions (Signed)
Urinary Tract Infection  Urinary tract infections (UTIs) can develop anywhere along your urinary tract. Your urinary tract is your body's drainage system for removing wastes and extra water. Your urinary tract includes two kidneys, two ureters, a bladder, and a urethra. Your kidneys are a pair of bean-shaped organs. Each kidney is about the size of your fist. They are located below your ribs, one on each side of your spine.  CAUSES  Infections are caused by microbes, which are microscopic organisms, including fungi, viruses, and bacteria. These organisms are so small that they can only be seen through a microscope. Bacteria are the microbes that most commonly cause UTIs.  SYMPTOMS   Symptoms of UTIs may vary by age and gender of the patient and by the location of the infection. Symptoms in young women typically include a frequent and intense urge to urinate and a painful, burning feeling in the bladder or urethra during urination. Older women and men are more likely to be tired, shaky, and weak and have muscle aches and abdominal pain. A fever may mean the infection is in your kidneys. Other symptoms of a kidney infection include pain in your back or sides below the ribs, nausea, and vomiting.  DIAGNOSIS  To diagnose a UTI, your caregiver will ask you about your symptoms. Your caregiver also will ask to provide a urine sample. The urine sample will be tested for bacteria and white blood cells. White blood cells are made by your body to help fight infection.  TREATMENT   Typically, UTIs can be treated with medication. Because most UTIs are caused by a bacterial infection, they usually can be treated with the use of antibiotics. The choice of antibiotic and length of treatment depend on your symptoms and the type of bacteria causing your infection.  HOME CARE INSTRUCTIONS   If you were prescribed antibiotics, take them exactly as your caregiver instructs you. Finish the medication even if you feel better after you  have only taken some of the medication.   Drink enough water and fluids to keep your urine clear or pale yellow.   Avoid caffeine, tea, and carbonated beverages. They tend to irritate your bladder.   Empty your bladder often. Avoid holding urine for long periods of time.   Empty your bladder before and after sexual intercourse.   After a bowel movement, women should cleanse from front to back. Use each tissue only once.  SEEK MEDICAL CARE IF:    You have back pain.   You develop a fever.   Your symptoms do not begin to resolve within 3 days.  SEEK IMMEDIATE MEDICAL CARE IF:    You have severe back pain or lower abdominal pain.   You develop chills.   You have nausea or vomiting.   You have continued burning or discomfort with urination.  MAKE SURE YOU:    Understand these instructions.   Will watch your condition.   Will get help right away if you are not doing well or get worse.  Document Released: 07/28/2005 Document Revised: 04/18/2012 Document Reviewed: 11/26/2011  ExitCare Patient Information 2014 [employer].

## 2014-04-17 NOTE — Assessment & Plan Note (Signed)
Symptomatic and UA positive. Hx of prior UTI 6 months ago. Start Keflex Discussed close f/u with PCP in order to find underlying issues that may be precipitating this frequent infections.

## 2014-04-17 NOTE — Progress Notes (Signed)
Family Medicine Office Visit Note   Subjective:   Patient ID: Heather Bowman, female  DOB: [birth year], 33 y.o.. MRN: [id number]   Pt that comes today complaining of burning with urination and frequency for 2 days. She denies flank pain, nausea, vomiting, fevers or chills. Also denies pelvic/abdominal pain or vaginal discharge.  Review of Systems:  Per HPI  Objective:   Physical Exam: Gen:  NAD HEENT: Moist mucous membranes  CV: Regular rate and rhythm, no murmurs rubs or gallops PULM: Clear to auscultation bilaterally. No wheezes/rales/rhonchi ABD: Soft, non tender, non distended, normal bowel sounds. No CVA tenderness. EXT: No edema Neuro: Alert and oriented x3. No focalization  Assessment & Plan:

## 2014-04-23 ENCOUNTER — Other Ambulatory Visit: Payer: Self-pay | Admitting: Family Medicine

## 2014-05-28 ENCOUNTER — Ambulatory Visit: Payer: No Typology Code available for payment source

## 2014-08-26 ENCOUNTER — Other Ambulatory Visit: Payer: Self-pay | Admitting: Family Medicine

## 2014-09-03 ENCOUNTER — Other Ambulatory Visit: Payer: Self-pay | Admitting: Family Medicine

## 2014-09-03 ENCOUNTER — Other Ambulatory Visit: Payer: Self-pay | Admitting: *Deleted

## 2014-09-03 MED ORDER — NORGESTIMATE-ETH ESTRADIOL 0.25-35 MG-MCG PO TABS: 1.0000 | ORAL_TABLET | Freq: Every day | ORAL | Status: DC

## 2014-09-03 MED ORDER — PROMETHAZINE HCL 25 MG PO TABS: 25.0000 mg | ORAL_TABLET | Freq: Three times a day (TID) | ORAL | Status: DC | PRN

## 2014-09-05 ENCOUNTER — Other Ambulatory Visit: Payer: Self-pay | Admitting: *Deleted

## 2014-09-08 MED ORDER — PROMETHAZINE HCL 25 MG PO TABS: 25.0000 mg | ORAL_TABLET | Freq: Three times a day (TID) | ORAL | Status: DC | PRN

## 2014-09-10 ENCOUNTER — Ambulatory Visit (INDEPENDENT_AMBULATORY_CARE_PROVIDER_SITE_OTHER): Payer: No Typology Code available for payment source | Admitting: Family Medicine

## 2014-09-10 ENCOUNTER — Ambulatory Visit (INDEPENDENT_AMBULATORY_CARE_PROVIDER_SITE_OTHER): Payer: No Typology Code available for payment source | Admitting: *Deleted

## 2014-09-10 ENCOUNTER — Encounter: Payer: Self-pay | Admitting: Family Medicine

## 2014-09-10 VITALS — BP 137/99 | HR 102 | Temp 98.2°F | Ht 65.0 in | Wt 172.0 lb

## 2014-09-10 DIAGNOSIS — I889 Nonspecific lymphadenitis, unspecified: Secondary | ICD-10-CM

## 2014-09-10 DIAGNOSIS — Z23 Encounter for immunization: Secondary | ICD-10-CM

## 2014-09-10 DIAGNOSIS — E282 Polycystic ovarian syndrome: Secondary | ICD-10-CM

## 2014-09-10 DIAGNOSIS — R300 Dysuria: Secondary | ICD-10-CM

## 2014-09-10 DIAGNOSIS — N3000 Acute cystitis without hematuria: Secondary | ICD-10-CM

## 2014-09-10 LAB — POCT URINALYSIS DIPSTICK
Blood, UA: NEGATIVE
GLUCOSE UA: NEGATIVE
KETONES UA: 15
Leukocytes, UA: NEGATIVE
Nitrite, UA: NEGATIVE
Protein, UA: NEGATIVE
SPEC GRAV UA: 1.025
Urobilinogen, UA: 1
pH, UA: 5.5

## 2014-09-10 LAB — BASIC METABOLIC PANEL
BUN: 10 mg/dL (ref 6–23)
CALCIUM: 9.9 mg/dL (ref 8.4–10.5)
CO2: 25 meq/L (ref 19–32)
Chloride: 99 mEq/L (ref 96–112)
Creat: 0.92 mg/dL (ref 0.50–1.10)
Glucose, Bld: 92 mg/dL (ref 70–99)
POTASSIUM: 4.3 meq/L (ref 3.5–5.3)
SODIUM: 135 meq/L (ref 135–145)

## 2014-09-10 LAB — POCT GLYCOSYLATED HEMOGLOBIN (HGB A1C): Hemoglobin A1C: 5.9

## 2014-09-10 LAB — TSH: TSH: 1.734 u[IU]/mL (ref 0.350–4.500)

## 2014-09-10 MED ORDER — METFORMIN HCL 500 MG PO TABS: ORAL_TABLET | ORAL | Status: DC

## 2014-09-10 NOTE — Assessment & Plan Note (Signed)
Ongoing issue for her w/o improvement with conservative measures. - Referral to surgery for further evaluation  - F/U in 2-3 months to see how doing.

## 2014-09-10 NOTE — Assessment & Plan Note (Signed)
UA negative for UTI - Dehydration evident with ketones/SG, would recommend increased fluid intake - If continues to be elevated, would look for secondary causes including GC/Chlamydia, which explained to pt would require further testing.  Understands and will RTC in one week if no improvement.

## 2014-09-10 NOTE — Assessment & Plan Note (Signed)
Refill Metformin today - Check BMET for renal fxn - F/U in 6-12 months - Will also check TSH as having more heat intolerance

## 2014-09-10 NOTE — Progress Notes (Signed)
Heather Bowman is a 33 y.o. who presents today for low back pain and R axillary lymphadenitis   Chronic Pain Syndrome - Followed by PM&R for this.  On Nucentya, Elavil, Ibuprofen, and Zanaflex.  Compliant with medications, doing well.   R Axillary Lymphadenitis - Recurrent, c/w Hidradenitis Suppurativa.  Has tried conservative measurements with not shaving, without using deodorant, topical antibiotics without improvement.  This has been ongoing issue now for over a year that has been worsening now.   Dysuria - Pt states this has been ongoing now for about 1 week.  Has hx of UTI's over the past several years.  Denies hematuria, fevers, chills, CVA tenderness, general abdominal pain.  Has not tried anything to make this better to this point and usually responds to ABx.    Past Medical History  Diagnosis Date  . Anxiety   . Arthritis   . GERD (gastroesophageal reflux disease)   . Depression   . Chronic pain syndrome    ROS: Negative except HPI   Physical Exam Filed Vitals:   09/10/14 1034  BP: 137/99  Pulse: 102  Temp: 98.2 F (36.8 C)    Gen: NAD, Well nourished, Well developed Cardio: RRR, No murmurs/gallops/rubs Lungs: CTA, no wheezes, rhonchi, crackles Abdomen: Soft/NT/ND, NABS, no suprapubic tenderness Neuro: CN 2-12 intact, MS 5/5 B/L UE and LE, +2 patellar and achilles relfex b/l  R axillary Region - Lymph: + R axillary lymphadenitis. No subclavian/supraclavicular, epicondylar LAD.   Greater than 50% of the 25 minute visit was spent face to face discussing options, management, and evaluation of her conditions.

## 2014-10-08 ENCOUNTER — Ambulatory Visit: Payer: No Typology Code available for payment source | Admitting: Family Medicine

## 2014-11-08 ENCOUNTER — Other Ambulatory Visit: Payer: Self-pay | Admitting: Family Medicine

## 2014-11-08 MED ORDER — NORGESTIMATE-ETH ESTRADIOL 0.25-35 MG-MCG PO TABS: 1.0000 | ORAL_TABLET | Freq: Every day | ORAL | Status: DC

## 2014-11-08 MED ORDER — PROMETHAZINE HCL 25 MG PO TABS: 25.0000 mg | ORAL_TABLET | Freq: Three times a day (TID) | ORAL | Status: DC | PRN

## 2014-11-08 NOTE — Telephone Encounter (Signed)
Pt called and needs her BC [facility]

## 2014-11-11 NOTE — Telephone Encounter (Signed)
LVM for patient to call back to inform her that request rx's have been sent in

## 2014-11-14 ENCOUNTER — Ambulatory Visit: Payer: No Typology Code available for payment source

## 2014-11-28 ENCOUNTER — Ambulatory Visit: Payer: Self-pay

## 2014-12-20 ENCOUNTER — Ambulatory Visit (INDEPENDENT_AMBULATORY_CARE_PROVIDER_SITE_OTHER): Payer: Self-pay | Admitting: Family Medicine

## 2014-12-20 ENCOUNTER — Encounter: Payer: Self-pay | Admitting: Family Medicine

## 2014-12-20 ENCOUNTER — Other Ambulatory Visit: Payer: Self-pay | Admitting: Family Medicine

## 2014-12-20 VITALS — BP 142/98 | HR 127 | Temp 98.3°F | Ht 65.0 in | Wt 179.2 lb

## 2014-12-20 DIAGNOSIS — E282 Polycystic ovarian syndrome: Secondary | ICD-10-CM

## 2014-12-20 MED ORDER — CLOMIPHENE CITRATE 50 MG PO TABS: ORAL_TABLET | ORAL | Status: DC

## 2014-12-20 NOTE — Patient Instructions (Signed)
Clomiphene tablets What is this medicine? CLOMIPHENE (KLOE mi feen) is a fertility drug that increases the chance of pregnancy. It helps women ovulate (produce a mature egg) during their cycle. This medicine may be used for other purposes; ask your health care provider or pharmacist if you have questions. COMMON BRAND NAME(S): Clomid, Serophene What should I tell my health care provider before I take this medicine? They need to know if you have any of these conditions: -adrenal gland disease -blood vessel disease or blood clots -cyst on the ovary -endometriosis -liver disease -ovarian cancer -pituitary gland disease -vaginal bleeding that has not been evaluated -an unusual or allergic reaction to clomiphene, other medicines, foods, dyes, or preservatives -pregnant (should not be used if you are already pregnant) -breast-feeding How should I use this medicine? Take this medicine by mouth with a glass of water. Follow the directions on the prescription label. Take exactly as directed for the exact number of days prescribed. Take your doses at regular intervals. Most women take this medicine for a 5 day period, but the length of treatment may be adjusted. Your doctor will give you a start date for this medication and will give you instructions on proper use. Do not take your medicine more often than directed. Talk to your pediatrician regarding the use of this medicine in children. Special care may be needed. Overdosage: If you think you have taken too much of this medicine contact a poison control center or emergency room at once. NOTE: This medicine is only for you. Do not share this medicine with others. What if I miss a dose? If you miss a dose, take it as soon as you can. If it is almost time for your next dose, take only that dose. Do not take double or extra doses. What may interact with this medicine? -herbal or dietary supplements, like blue cohosh, black cohosh, chasteberry, or  DHEA -prasterone This list may not describe all possible interactions. Give your health care provider a list of all the medicines, herbs, non-prescription drugs, or dietary supplements you use. Also tell them if you smoke, drink alcohol, or use illegal drugs. Some items may interact with your medicine. What should I watch for while using this medicine? Make sure you understand how and when to use this medicine. You need to know when you are ovulating and when to have sexual intercourse. This will increase the chance of a pregnancy. Visit your doctor or health care professional for regular checks on your progress. You may need tests to check the hormone levels in your blood or you may have to use home-urine tests to check for ovulation. Try to keep any appointments. Compared to other fertility treatments, this medicine does not greatly increase your chances of having multiple babies. An increased chance of having twins may occur in roughly 5 out of every 100 women who take this medication. Stop taking this medicine at once and contact your doctor or health care professional if you think you are pregnant. This medicine is not for long-term use. Most women that benefit from this medicine do so within the first three cycles (months). Your doctor or health care professional will monitor your condition. This medicine is usually used for a total of 6 cycles of treatment. You may get drowsy or dizzy. Do not drive, use machinery, or do anything that needs mental alertness until you know how this drug affects you. Do not stand or sit up quickly. This reduces the risk of dizzy or   fainting spells. Drinking alcoholic beverages or smoking tobacco may decrease your chance of becoming pregnant. Limit or stop alcohol and tobacco use during your fertility treatments. What side effects may I notice from receiving this medicine? Side effects that you should report to your doctor or health care professional as soon as  possible: -allergic reactions like skin rash, itching or hives, swelling of the face, lips, or tongue -breathing problems -changes in vision -fluid retention -nausea, vomiting -pelvic pain or bloating -severe abdominal pain -sudden weight gain Side effects that usually do not require medical attention (report to your doctor or health care professional if they continue or are bothersome): -breast discomfort -hot flashes -mild pelvic discomfort -mild nausea This list may not describe all possible side effects. Call your doctor for medical advice about side effects. You may report side effects to FDA at 1-800-FDA-1088. Where should I keep my medicine? Keep out of the reach of children. Store at room temperature between 15 and 30 degrees C (59 and 86 degrees F). Protect from heat, light, and moisture. Throw away any unused medicine after the expiration date. NOTE: This sheet is a summary. It may not cover all possible information. If you have questions about this medicine, talk to your doctor, pharmacist, or health care provider.  2015, [employer]vier/[employer] Standard. (2008-01-29 22:21:06)

## 2014-12-20 NOTE — Assessment & Plan Note (Signed)
Interested in trying to become pregnant. - Will stop her OCP and start Clomiphene at beginning of cycle for 5 days. - F/U in 3-4 weeks to see how doing - Can consider increase to 100 mg for 5 days at beginning of cycle if ovulation does not occur

## 2014-12-20 NOTE — Progress Notes (Signed)
Heather Bowman is a 34 y.o. female who presents today for PCOS evaluation.  PCOS/Fertility - Pt has had PCOS for several years now and has been on OCP for about 2 years since last discussion about wanting to become pregnant.  She denies any recent abnormal vaginal/uterine bleeding, VTE, HA, blurred vision.  She is currently wanting to become pregnant and has researched Clomiphene.  Interested in trying this.   Past Medical History  Diagnosis Date  . Anxiety   . Arthritis   . GERD (gastroesophageal reflux disease)   . Depression   . Chronic pain syndrome     History  Smoking status  . Never Smoker   Smokeless tobacco  . Never Used    Family History  Problem Relation Age of Onset  . Heart disease Father   . Diabetes Father   . Hyperlipidemia Father   . Hypertension Father   . Stroke Father   . Depression Sister   . Diabetes Sister   . Hyperlipidemia Sister   . Hypertension Sister   . Asthma Brother   . Cancer Maternal Grandmother     Current Outpatient Prescriptions on File Prior to Visit  Medication Sig Dispense Refill  . amitriptyline (ELAVIL) 25 MG tablet Take 25 mg by mouth. 2 tabs PO at bedtime  0  . metFORMIN (GLUCOPHAGE) 500 MG tablet TAKE 1 TABLET BY MOUTH DAILY WITH BREAKFAST 30 tablet 11  . norgestimate-ethinyl estradiol (SPRINTEC 28) 0.25-35 MG-MCG tablet Take 1 tablet by mouth daily. 1 Package 11  . NUCYNTA 75 MG TABS Take 75 mg by mouth. 1 tablet 3 times daily as needed  0  . omeprazole (PRILOSEC) 40 MG capsule Take 1 capsule (40 mg total) by mouth daily. 30 capsule 3  . promethazine (PHENERGAN) 25 MG tablet Take 1 tablet (25 mg total) by mouth every 8 (eight) hours as needed for nausea or vomiting. 90 tablet 1  . tiZANidine (ZANAFLEX) 2 MG tablet   0  . venlafaxine (EFFEXOR) 75 MG tablet   2   No current facility-administered medications on file prior to visit.    ROS: Per HPI.  All other systems reviewed and are negative.   Physical Exam Filed Vitals:    12/20/14 1013  BP: 142/98  Pulse: 127  Temp: 98.3 F (36.8 C)    Physical Examination: General appearance - alert, well appearing, and in no distress Neck: Normal gross palpable thyroid  Heart - normal rate and regular rhythm, no murmurs noted Extremities - no pedal edema noted, intact peripheral pulses   Lab Results  Component Value Date   TSH 1.734 09/10/2014   Lab Results  Component Value Date   HGBA1C 5.9 09/10/2014

## 2015-03-21 ENCOUNTER — Ambulatory Visit (INDEPENDENT_AMBULATORY_CARE_PROVIDER_SITE_OTHER): Payer: No Typology Code available for payment source | Admitting: Family Medicine

## 2015-03-21 ENCOUNTER — Encounter: Payer: Self-pay | Admitting: Family Medicine

## 2015-03-21 VITALS — BP 131/96 | HR 118 | Temp 98.4°F | Ht 65.0 in | Wt 180.0 lb

## 2015-03-21 DIAGNOSIS — R300 Dysuria: Secondary | ICD-10-CM

## 2015-03-21 DIAGNOSIS — R399 Unspecified symptoms and signs involving the genitourinary system: Secondary | ICD-10-CM

## 2015-03-21 DIAGNOSIS — Z3169 Encounter for other general counseling and advice on procreation: Secondary | ICD-10-CM

## 2015-03-21 LAB — POCT UA - MICROSCOPIC ONLY

## 2015-03-21 LAB — POCT URINALYSIS DIPSTICK
Bilirubin, UA: NEGATIVE
Glucose, UA: NEGATIVE
LEUKOCYTES UA: NEGATIVE
Nitrite, UA: NEGATIVE
Spec Grav, UA: >=1.03
Urobilinogen, UA: 0.2
pH, UA: 5.5

## 2015-03-21 NOTE — Assessment & Plan Note (Signed)
Heather Bowman is a 34 y.o. with complaints if urinary discomfort for 4 days. UA today with no concerns for infection. She is mildly dehydrated with ketones. Large blood, but she on her menses.  - Advised her to drink 60-70 ounces of water a day - Patient had multiple questions on infertility and Clomid which were addressed today, AVS was also given to her explaining Clomid use an ovulation. She has been prescribed Clomid by her PCP. I advised her she was unable to get pregnant within the next few months, since she has been trying for 4 months she needs to follow-up with her PCP for further evaluation.

## 2015-03-21 NOTE — Patient Instructions (Signed)
It does not appear you have a urinary tract infection by your urinalysis today. I suspect some your symptoms are coming from bladder irritation and dehydration. Attempted taken at least 60-70 ounces of water a day to stay well-hydrated.  Clomid should be taken on day 5 of your period/cycle. Clomid is taken for 5 days which would take you today 10 of your cycle. You should have sex every other day from day 10 to day 56 of your cycle. Day 1 of your cycle is day 1 of your period. You have an elevated heart rate, you should see your PCP to discuss management options for this as well.  Infertility WHAT IS INFERTILITY?  Infertility is usually defined as not being able to get pregnant after trying for one year of regular sexual intercourse without the use of contraceptives. Or not being able to carry a pregnancy to term and have a baby. The infertility rate in the [place] is around 10%. Pregnancy is the result of a chain of events. A woman must release an egg from one of her ovaries (ovulation). The egg must be fertilized by the female sperm. Then it travels through a fallopian tube into the uterus (womb), where it attaches to the wall of the uterus and grows. A man must have enough sperm, and the sperm must join with (fertilize) the egg along the way, at the proper time. The fertilized egg must then become attached to the inside of the uterus. While this may seem simple, many things can happen to prevent pregnancy from occurring.  WHOSE PROBLEM IS IT?  About 20% of infertility cases are due to problems with the man (female factors) and 65% are due to problems with the woman (female factors). Other cases are due to a combination of female and female factors or to unknown causes.  WHAT CAUSES INFERTILITY IN MEN?  Infertility in men is often caused by problems with making enough normal sperm or getting the sperm to reach the egg. Problems with sperm may exist from birth or develop later in life, due to illness or  injury. Some men produce no sperm, or produce too few sperm (oligospermia). Other problems include:  Sexual dysfunction.  Hormonal or endocrine problems.  Age. Female fertility decreases with age, but not at as young an age as female fertility.  Infection.  Congenital problems. Birth defect, such as absence of the tubes that carry the sperm (vas deferens).  Genetic/chromosomal problems.  Antisperm antibody problems.  Retrograde ejaculation (sperm go into the bladder).  Varicoceles, spematoceles, or tumors of the testicles.  Lifestyle can influence the number and quality of a man's sperm.  Alcohol and drugs can temporarily reduce sperm quality.  Environmental toxins, including pesticides and lead, may cause some cases of infertility in men. WHAT CAUSES INFERTILITY IN WOMEN?   Problems with ovulation account for most infertility in women. Without ovulation, eggs are not available to be fertilized.  Signs of problems with ovulation include irregular menstrual periods or no periods at all.  Simple lifestyle factors, including stress, diet, or athletic training, can affect a woman's hormonal balance.  Age. Fertility begins to decrease in women in the early 28s and is worse after age 57.  Much less often, a hormonal imbalance from a serious medical problem, such as a pituitary gland tumor, thyroid or other chronic medical disease, can cause ovulation problems.  Pelvic infections.  Polycystic ovary syndrome (increase in female hormones, unable to ovulate).  Alcohol or illegal drugs.  Environmental  toxins, radiation, pesticides, and certain chemicals.  Aging is an important factor in female infertility.  The ability of a woman's ovaries to produce eggs declines with age, especially after age 47. About one third of couples where the woman is over 19 will have problems with fertility.  By the time she reaches menopause when her monthly periods stop for good, a woman can no longer  produce eggs or become pregnant.  Other problems can also lead to infertility in women. If the fallopian tubes are blocked at one or both ends, the egg cannot travel through the tubes into the uterus. Scar tissue (adhesions) in the pelvis may cause blocked tubes. This may result from pelvic inflammatory disease, endometriosis, or surgery for an ectopic pregnancy (fertilized egg implanted outside the uterus) or any pelvic or abdominal surgery causing adhesions.  Fibroid tumors or polyps of the uterus.  Congenital (birth defect) abnormalities of the uterus.  Infection of the cervix (cervicitis).  Cervical stenosis (narrowing).  Abnormal cervical mucus.  Polycystic ovary syndrome.  Having sexual intercourse too often (every other day or 4 to 5 times a week).  Obesity.  Anorexia.  Poor nutrition.  Over exercising, with loss of body fat.  DES. Your mother received diethylstilbesterol hormone when pregnant with you. HOW IS INFERTILITY TESTED?  If you have been trying to have a baby without success, you may want to seek medical help. You should not wait for one year of trying before seeing a health care provider if:  You are over 35.  You have reason to believe that there may be a fertility problem. A medical evaluation may determine the reasons for a couple's infertility. Usually this process begins with:  Physical exams.  Medical histories of both partners.  Sexual histories of both partners. If there is no obvious problem, like improperly timed intercourse or absence of ovulation, tests may be needed.   For a man, testing usually begins with tests of his semen to look at:  The number of sperm.  The shape of sperm.  Movement of his sperm.  Taking a complete medical and surgical history.  Physical examination.  Check for infection of the female reproductive organs. Sometimes hormone tests are done.   For a woman, the first step in testing is to find out if she is  ovulating each month. There are several ways to do this. For example, she can keep track of changes in her morning body temperature and in the texture of her cervical mucus. Another tool is a home ovulation test kit, which can be bought at drug or grocery stores.  Checks of ovulation can also be done in the doctor's office, using blood tests for hormone levels or ultrasound tests of the ovaries. If the woman is ovulating, more tests will need to be done. Some common female tests include:  Hysterosalpingogram: An x-ray of the fallopian tubes and uterus after they are injected with dye. It shows if the tubes are open and shows the shape of the uterus.  Laparoscopy: An exam of the tubes and other female organs for disease. A lighted tube called a laparoscope is used to see inside the abdomen.  Endometrial biopsy: Sample of uterus tissue taken on the first day of the menstrual period, to see if the tissue indicates you are ovulating.  Transvaginal ultrasound: Examines the female organs.  Hysteroscopy: Uses a lighted tube to examine the cervix and inside the uterus, to see if there are any abnormalities inside the uterus. TREATMENT  Depending on the test results, different treatments can be suggested. The type of treatment depends on the cause. 85 to 90% of infertility cases are treated with drugs or surgery.   Various fertility drugs may be used for women with ovulation problems. It is important to talk with your caregiver about the drug to be used. You should understand the drug's benefits and side effects. Depending on the type of fertility drug and the dosage of the drug used, multiple births (twins or multiples) can occur in some women.  If needed, surgery can be done to repair damage to a woman's ovaries, fallopian tubes, cervix, or uterus.  Surgery or medical treatment for endometriosis or polycystic ovary syndrome. Sometimes a man has an infertility problem that can be corrected with medicine  or by surgery.  Intrauterine insemination (IUI) of sperm, timed with ovulation.  Change in lifestyle, if that is the cause (lose weight, increase exercise, and stop smoking, drinking excessively, or taking illegal drugs).  Other types of surgery:  Removing growths inside and on the uterus.  Removing scar tissue from inside of the uterus.  Fixing blocked tubes.  Removing scar tissue in the pelvis and around the female organs. WHAT IS ASSISTED REPRODUCTIVE TECHNOLOGY (ART)?  Assisted reproductive technology (ART) is another form of special methods used to help infertile couples. ART involves handling both the woman's eggs and the man's sperm. Success rates vary and depend on many factors. ART can be expensive and time-consuming. But ART has made it possible for many couples to have children that otherwise would not have been conceived. Some methods are listed below:  In vitro fertilization (IVF). IVF is often used when a woman's fallopian tubes are blocked or when a man has low sperm counts. A drug is used to stimulate the ovaries to produce multiple eggs. Once mature, the eggs are removed and placed in a culture dish with the man's sperm for fertilization. After about 40 hours, the eggs are examined to see if they have become fertilized by the sperm and are dividing into cells. These fertilized eggs (embryos) are then placed in the woman's uterus. This bypasses the fallopian tubes.  Gamete intrafallopian transfer (GIFT) is similar to IVF, but used when the woman has at least one normal fallopian tube. Three to five eggs are placed in the fallopian tube, along with the man's sperm, for fertilization inside the woman's body.  Zygote intrafallopian transfer (ZIFT), also called tubal embryo transfer, combines IVF and GIFT. The eggs retrieved from the woman's ovaries are fertilized in the lab and placed in the fallopian tubes rather than in the uterus.  ART procedures sometimes involve the use of  donor eggs (eggs from another woman) or previously frozen embryos. Donor eggs may be used if a woman has impaired ovaries or has a genetic disease that could be passed on to her baby.  When performing ART, you are at higher risk for resulting in multiple pregnancies, twins, triplets or more.  Intracytoplasma sperm injection is a procedure that injects a single sperm into the egg to fertilize it.  Embryo transplant is a procedure that starts after growing an embryo in a special media (chemical solution) developed to keep the embryo alive for 2 to 5 days, and then transplanting it into the uterus. In cases where a cause cannot be found and pregnancy does not occur, adoption may be a consideration. Document Released: 10/21/2003 Document Revised: 01/10/2012 Document Reviewed: 09/16/2009 [facility] Patient Information 2015 [place], [place]. This information is  not intended to replace advice given to you by your health care provider. Make sure you discuss any questions you have with your health care provider.  Preparing for Pregnancy Before trying to become pregnant, make an appointment with your health care provider (preconception care). The goal is to help you have a healthy, safe pregnancy. At your first appointment, your health care provider will:   Do a complete physical exam, including a Pap test.  Take a complete medical history.  Give you advice and help you resolve any problems. PRECONCEPTION CHECKLIST Here is a list of the basics to cover with your health care provider at your preconception visit:  Medical history.  Tell your health care provider about any diseases you have had. Many diseases can affect your pregnancy.  Include your partner's medical history and family history.  Make sure you have been tested for sexually transmitted infections (STIs). These can affect your pregnancy. In some cases, they can be passed to your baby. Tell your health care provider about any history of  STIs.  Make sure your health care provider knows about any previous problems you have had with conception or pregnancy.  Tell your health care provider about any medicine you take. This includes herbal supplements and over-the-counter medicines.  Make sure all your immunizations are up to date. You may need to make additional appointments.  Ask your health care provider if you need any vaccinations or if there are any you should avoid.  Diet.  It is especially important to eat a healthy, balanced diet with the right nutrients when you are pregnant.  Ask your health care provider to help you get to a healthy weight before pregnancy.  If you are overweight, you are at higher risk for certain complications. These include high blood pressure, diabetes, and preterm birth.  If you are underweight, you are more likely to have a low-birth-weight baby.  Lifestyle.  Tell your health care provider about lifestyle factors such as alcohol use, drug use, or smoking.  Describe any harmful substances you may be exposed to at work or home. These can include chemicals, pesticides, and radiation.  Mental health.  Let your health care provider know if you have been feeling depressed or anxious.  Let your health care provider know if you have a history of substance abuse.  Let your health care provider know if you do not feel safe at home. HOME INSTRUCTIONS TO PREPARE FOR PREGNANCY Follow your health care provider's advice and instructions.   Keep an accurate record of your menstrual periods. This makes it easier for your health care provider to determine your baby's due date.  Begin taking prenatal vitamins and folic acid supplements daily. Take them as directed by your health care provider.  Eat a balanced diet. Get help from a nutrition counselor if you have questions or need help.  Get regular exercise. Try to be active for at least 30 minutes a day most days of the week.  Quit smoking, if  you smoke.  Do not drink alcohol.  Do not take illegal drugs.  Get medical problems, such as diabetes or high blood pressure, under control.  If you have diabetes, make sure you do the following:  Have good blood sugar control. If you have type 1 diabetes, use multiple daily doses of insulin. Do not use split-dose or premixed insulin.  Have an eye exam by a qualified eye care professional trained in caring for people with diabetes.  Get evaluated by your  health care provider for cardiovascular disease.  Get to a healthy weight. If you are overweight or obese, reduce your weight with the help of a qualified health professional such as a Firefighter. Ask your health care provider what the right weight range is for you. HOW DO I KNOW I AM PREGNANT? You may be pregnant if you have been sexually active and you miss your period. Symptoms of early pregnancy include:   Mild cramping.  Very light vaginal bleeding (spotting).  Feeling unusually tired.  Morning sickness. If you have any of these symptoms, take a home pregnancy test. These tests look for a hormone called human chorionic gonadotropin (hCG) in your urine. Your body begins to make this hormone during early pregnancy. These tests are very accurate. Wait until at least the first day you miss your period to take one. If you get a positive result, call your health care provider to make appointments for prenatal care. WHAT SHOULD I DO IF I BECOME PREGNANT?  Make an appointment with your health care provider by week 12 of your pregnancy at the latest.  Do not smoke. Smoking can be harmful to your baby.  Do not drink alcoholic beverages. Alcohol is related to a number of birth defects.  Avoid toxic odors and chemicals.  You may continue to have sexual intercourse if it does not cause pain or other problems, such as vaginal bleeding. Document Released: 09/30/2008 Document Revised: 03/04/2014 Document Reviewed:  09/24/2013 [facility] Patient Information 2015 [place], [place]. This information is not intended to replace advice given to you by your health care provider. Make sure you discuss any questions you have with your health care provider.

## 2015-03-21 NOTE — Progress Notes (Signed)
   Subjective:    Patient ID: Heather Bowman, female    DOB: [birth year], 34 y.o.   MRN: [id number]  HPI  UTI sx: Patient presents to [facility] clinic with complaints of strong odor to her urine and dysuria for 4 days. She admits to urinary frequency and mild burning with urination. She admits her urine is still darker, but the odor and frequency have resolved. Patient denies nausea, vomit, fever, chills or rash. No vaginal discharge or irritation. She is having her menstrual cycle currently, and is trying to get pregnant.   Infertility: Patient has many questions surrounding the use of Clomid and ovulation. Patient is prescribed Clomid by her primary care provider has been taking it for a few months. She has a history of PCO S. She reports her periods are every 28-30 days, but the last period was 5 days late. Her significant other does have other children, she has been unable to bear children at age 2.  Never smoker  No Known Allergies Past Medical History  Diagnosis Date  . Anxiety   . Arthritis   . GERD (gastroesophageal reflux disease)   . Depression   . Chronic pain syndrome    No past surgical history on file.  Review of Systems Per HPI    Objective:   Physical Exam BP 131/96 mmHg  Pulse 118  Temp(Src) 98.4 F (36.9 C) (Oral)  Ht 5\' 5"  (1.651 m)  Wt 180 lb (81.647 kg)  BMI 29.95 kg/m2  LMP 03/20/2015 (Exact Date) Gen: NAD. Nontoxic in appearance, well-developed, well-nourished, African-American female. HEENT: AT. [place]. Tachy MM.  CV: Tachycardic. Regular rhythm Abd: Soft. Round. NTND. BS present. No Masses palpated.      Assessment & Plan:  Heather Bowman is a 34 y.o. with complaints if urinary discomfort for 4 days. UA today with no concerns for infection. She is mildly dehydrated with ketones. Large blood, but she on her menses.  - Advised her to drink 60-70 ounces of water a day - Patient had multiple questions on infertility and Clomid which were addressed today, AVS was  also given to her explaining Clomid use an ovulation. She has been prescribed Clomid by her PCP. I advised her she was unable to get pregnant within the next few months, since she has been trying for 4 months she needs to follow-up with her PCP for further evaluation.

## 2015-03-26 ENCOUNTER — Ambulatory Visit: Payer: No Typology Code available for payment source | Admitting: Family Medicine

## 2015-04-25 ENCOUNTER — Other Ambulatory Visit: Payer: Self-pay | Admitting: Family Medicine

## 2015-04-25 DIAGNOSIS — E282 Polycystic ovarian syndrome: Secondary | ICD-10-CM

## 2015-04-25 MED ORDER — CLOMIPHENE CITRATE 50 MG PO TABS: ORAL_TABLET | ORAL | Status: DC

## 2015-04-25 NOTE — Telephone Encounter (Signed)
Pt called and would like a refill on her Clomid. Please use her [place] at [place]. [place]

## 2015-04-25 NOTE — Telephone Encounter (Signed)
Pt called back to also ask can the doctor up the dosage on this medication. jw

## 2015-06-17 ENCOUNTER — Ambulatory Visit: Payer: Self-pay

## 2015-08-28 ENCOUNTER — Other Ambulatory Visit: Payer: Self-pay | Admitting: Family Medicine

## 2015-08-28 NOTE — Telephone Encounter (Signed)
This is your pt, see refill request

## 2015-08-28 NOTE — Telephone Encounter (Signed)
Please advise, due for annual exam.  Metformin sent in with 3 RF

## 2015-09-30 ENCOUNTER — Other Ambulatory Visit: Payer: Self-pay | Admitting: *Deleted

## 2015-10-10 ENCOUNTER — Ambulatory Visit (INDEPENDENT_AMBULATORY_CARE_PROVIDER_SITE_OTHER): Payer: No Typology Code available for payment source | Admitting: Family Medicine

## 2015-10-10 VITALS — BP 126/87 | HR 111 | Temp 98.4°F | Wt 177.0 lb

## 2015-10-10 DIAGNOSIS — K219 Gastro-esophageal reflux disease without esophagitis: Secondary | ICD-10-CM

## 2015-10-10 DIAGNOSIS — Z3169 Encounter for other general counseling and advice on procreation: Secondary | ICD-10-CM

## 2015-10-10 DIAGNOSIS — E282 Polycystic ovarian syndrome: Secondary | ICD-10-CM

## 2015-10-10 MED ORDER — METFORMIN HCL 500 MG PO TABS: 500.0000 mg | ORAL_TABLET | Freq: Every day | ORAL | Status: DC

## 2015-10-10 MED ORDER — OMEPRAZOLE 40 MG PO CPDR
DELAYED_RELEASE_CAPSULE | ORAL | Status: DC
Start: 2015-10-10 — End: 2016-05-14

## 2015-10-10 MED ORDER — PROMETHAZINE HCL 25 MG PO TABS: 25.0000 mg | ORAL_TABLET | Freq: Three times a day (TID) | ORAL | Status: DC | PRN

## 2015-10-10 NOTE — Patient Instructions (Signed)
Take the Omeprazole twice daily for 2 weeks,t hen once daily after that.  Referral to [facility] hospital.  Prescription for phenergan and metformin.    It was good to see you today.

## 2015-10-10 NOTE — Assessment & Plan Note (Addendum)
Persists. Also with chronic nausea/vomiting. I have given her information on the map program to see if she can try something in the same class as omeprazole but perhaps a little stronger such as Nexium. Refill for omeprazole today. Is to take this twice a day for the next 2 weeks then decrease back down to 1 per day. Refill for but has become chronic use of Phenergan for her today. Evidently she was referred to psychiatrist by her chronic pain management. Recommended she follow up with them as this is likely exacerbating/underlying etiology for her persistent nausea. Declined GI referral today.

## 2015-10-10 NOTE — Assessment & Plan Note (Signed)
Asking for referral back to [facility] day. We'll place today. We'll hold off any Clomid to see what their recommendations are. Refill for metformin today.

## 2015-10-10 NOTE — Assessment & Plan Note (Signed)
As above, referring back to [facility].

## 2015-10-10 NOTE — Progress Notes (Signed)
Subjective:    Heather Bowman is a 34 y.o. female who presents to [facility] today for several issues:  1.  PCOS/infertility:  Chronic issue for patient. She has been on Clomid for loose past 9 months. Prescription ran out about 2 months ago. She's not had the ability to have this refilled. She is also taking metformin for the same. Also hasn't had this for the past 2 months. Clomid/metformin combination has made her periods more regular. However she is still infertile. Try to contact [facility] but was told later longer take the orange card. No menorrhagia or metrorrhagia.  2.  Nausea:  Persistent issue for past several years.  She is now chronically on Phenergan.   Also chronically on narcotic pain medications prescribed by her pain management.  Has been placed on new unknown medication for opiate-induced constipation. Describes nausea 4-5 days every single week. Vomiting is clear to yellow bile. Worsened by spicy foods. Has been diagnosed with irritable bowel syndrome in the past and reflux. Had some improvement with omeprazole and 2013 but has not tried this since then because she says set wasn't quite strong enough. No weight loss. No abdominal pain.   ROS as above per HPI, otherwise neg.  no fevers or chills. The following portions of the patient's history were reviewed and updated as appropriate: allergies, current medications, past medical history, family and social history, and problem list. Patient is a nonsmoker.    PMH reviewed.  Past Medical History  Diagnosis Date  . Anxiety   . Arthritis   . GERD (gastroesophageal reflux disease)   . Depression   . Chronic pain syndrome    No past surgical history on file.  Medications reviewed. Current Outpatient Prescriptions  Medication Sig Dispense Refill  . amitriptyline (ELAVIL) 25 MG tablet Take 25 mg by mouth. 2 tabs PO at bedtime  0  . clomiPHENE (CLOMID) 50 MG tablet Once daily for 5 days.  May repeat 30 days from beginning of cycle  30 tablet 1  . metFORMIN (GLUCOPHAGE) 500 MG tablet TAKE 1 TABLET BY MOUTH DAILY WITH BREAKFAST 30 tablet 3  . NUCYNTA 75 MG TABS Take 75 mg by mouth. 1 tablet 3 times daily as needed  0  . omeprazole (PRILOSEC) 40 MG capsule Take 1 capsule (40 mg total) by mouth daily. 30 capsule 3  . promethazine (PHENERGAN) 25 MG tablet Take 1 tablet (25 mg total) by mouth every 8 (eight) hours as needed for nausea or vomiting. 90 tablet 1  . tiZANidine (ZANAFLEX) 2 MG tablet   0  . venlafaxine (EFFEXOR) 75 MG tablet   2   No current facility-administered medications for this visit.     Objective:   Physical Exam BP 126/87 mmHg  Pulse 111  Temp(Src) 98.4 F (36.9 C) (Oral)  Wt 177 lb (80.287 kg)  LMP 10/05/2015 Gen:  Alert, cooperative patient who appears stated age in no acute distress.  Vital signs reviewed. HEENT: EOMI,  MMM Cardiac:  Regular rate and rhythm without murmur auscultated.  Good S1/S2. Pulm:  Clear to auscultation bilaterally with good air movement.  No wheezes or rales noted.   Abd:  Soft/nondistended. Bilaterally tender palpation epigastrium. No guarding or rebound. Good bowel sounds throughout all four quadrants.  No masses noted.  Psych: Conversant and pleasant today. No suicidal ideation.  No results found for this or any previous visit (from the past 72 hour(s)).  ]

## 2015-10-27 ENCOUNTER — Encounter (HOSPITAL_COMMUNITY): Payer: Self-pay | Admitting: Emergency Medicine

## 2015-10-27 ENCOUNTER — Emergency Department (HOSPITAL_COMMUNITY)
Admission: EM | Admit: 2015-10-27 | Discharge: 2015-10-27 | Disposition: A | Discharge status: Home / Self Care | Payer: No Typology Code available for payment source | Attending: Emergency Medicine | Admitting: Emergency Medicine

## 2015-10-27 DIAGNOSIS — R000 Tachycardia, unspecified: Secondary | ICD-10-CM | POA: Insufficient documentation

## 2015-10-27 DIAGNOSIS — G894 Chronic pain syndrome: Secondary | ICD-10-CM | POA: Insufficient documentation

## 2015-10-27 DIAGNOSIS — M1990 Unspecified osteoarthritis, unspecified site: Secondary | ICD-10-CM | POA: Insufficient documentation

## 2015-10-27 DIAGNOSIS — K589 Irritable bowel syndrome without diarrhea: Secondary | ICD-10-CM | POA: Insufficient documentation

## 2015-10-27 DIAGNOSIS — R197 Diarrhea, unspecified: Secondary | ICD-10-CM | POA: Insufficient documentation

## 2015-10-27 DIAGNOSIS — R1084 Generalized abdominal pain: Secondary | ICD-10-CM | POA: Insufficient documentation

## 2015-10-27 DIAGNOSIS — R112 Nausea with vomiting, unspecified: Secondary | ICD-10-CM | POA: Insufficient documentation

## 2015-10-27 DIAGNOSIS — Z79899 Other long term (current) drug therapy: Secondary | ICD-10-CM | POA: Insufficient documentation

## 2015-10-27 DIAGNOSIS — K219 Gastro-esophageal reflux disease without esophagitis: Secondary | ICD-10-CM | POA: Insufficient documentation

## 2015-10-27 DIAGNOSIS — F329 Major depressive disorder, single episode, unspecified: Secondary | ICD-10-CM | POA: Insufficient documentation

## 2015-10-27 DIAGNOSIS — Z7984 Long term (current) use of oral hypoglycemic drugs: Secondary | ICD-10-CM | POA: Insufficient documentation

## 2015-10-27 DIAGNOSIS — F419 Anxiety disorder, unspecified: Secondary | ICD-10-CM | POA: Insufficient documentation

## 2015-10-27 LAB — COMPREHENSIVE METABOLIC PANEL
ALBUMIN: 4.5 g/dL (ref 3.5–5.0)
ALK PHOS: 46 U/L (ref 38–126)
ALT: 18 U/L (ref 14–54)
ANION GAP: 7 (ref 5–15)
AST: 19 U/L (ref 15–41)
BUN: 12 mg/dL (ref 6–20)
CALCIUM: 10.2 mg/dL (ref 8.9–10.3)
CHLORIDE: 106 mmol/L (ref 101–111)
CO2: 28 mmol/L (ref 22–32)
Creatinine, Ser: 1.01 mg/dL — ABNORMAL HIGH (ref 0.44–1.00)
GFR calc Af Amer: >60 mL/min (ref >60)
GFR calc non Af Amer: >60 mL/min (ref >60)
GLUCOSE: 117 mg/dL — AB (ref 65–99)
POTASSIUM: 4 mmol/L (ref 3.5–5.1)
SODIUM: 141 mmol/L (ref 135–145)
Total Bilirubin: 0.7 mg/dL (ref 0.3–1.2)
Total Protein: 8.3 g/dL — ABNORMAL HIGH (ref 6.5–8.1)

## 2015-10-27 LAB — CBC
HEMATOCRIT: 45 % (ref 36.0–46.0)
HEMOGLOBIN: 14.6 g/dL (ref 12.0–15.0)
MCH: 28 pg (ref 26.0–34.0)
MCHC: 32.4 g/dL (ref 30.0–36.0)
MCV: 86.4 fL (ref 78.0–100.0)
Platelets: 267 10*3/uL (ref 150–400)
RBC: 5.21 MIL/uL — ABNORMAL HIGH (ref 3.87–5.11)
RDW: 12.7 % (ref 11.5–15.5)
WBC: 5.3 10*3/uL (ref 4.0–10.5)

## 2015-10-27 LAB — URINALYSIS, ROUTINE W REFLEX MICROSCOPIC
GLUCOSE, UA: NEGATIVE mg/dL
HGB URINE DIPSTICK: NEGATIVE
Ketones, ur: 40 mg/dL — AB
Leukocytes, UA: NEGATIVE
Nitrite: NEGATIVE
PH: 5.5 (ref 5.0–8.0)
Protein, ur: NEGATIVE mg/dL
SPECIFIC GRAVITY, URINE: 1.028 (ref 1.005–1.030)

## 2015-10-27 LAB — LIPASE, BLOOD: LIPASE: 20 U/L (ref 11–51)

## 2015-10-27 MED ORDER — SODIUM CHLORIDE 0.9 % IV BOLUS (SEPSIS)
1000.0000 mL | Freq: Once | INTRAVENOUS | Status: AC
  Administered 2015-10-27: 1000 mL via INTRAVENOUS

## 2015-10-27 MED ORDER — ONDANSETRON 4 MG PO TBDP: 4.0000 mg | ORAL_TABLET | Freq: Three times a day (TID) | ORAL | Status: DC | PRN

## 2015-10-27 MED ORDER — DICYCLOMINE HCL 20 MG PO TABS
20.0000 mg | ORAL_TABLET | Freq: Two times a day (BID) | ORAL | Status: DC
Start: 2015-10-27 — End: 2016-12-03

## 2015-10-27 MED ORDER — ONDANSETRON HCL 4 MG/2ML IJ SOLN
4.0000 mg | Freq: Once | INTRAMUSCULAR | Status: AC
  Administered 2015-10-27: 4 mg via INTRAVENOUS
  Filled 2015-10-27: qty 2

## 2015-10-27 MED ORDER — MORPHINE SULFATE (PF) 4 MG/ML IV SOLN
4.0000 mg | Freq: Once | INTRAVENOUS | Status: AC
  Administered 2015-10-27: 4 mg via INTRAVENOUS
  Filled 2015-10-27: qty 1

## 2015-10-27 NOTE — ED Notes (Signed)
Two unsuccessful IV attempts by this writer.  

## 2015-10-27 NOTE — ED Notes (Signed)
Pt c/o NVD x 2 days.   C/o gen abd pain.

## 2015-10-27 NOTE — ED Provider Notes (Signed)
CSN: [id number]     Arrival date & time 10/27/15  1408 History   First MD Initiated Contact with Patient 10/27/15 1700     Chief Complaint  Patient presents with  . Nausea  . Emesis  . Diarrhea   HPI   Ms. Heather Bowman is an 34 y.o. female with history of IBS, GERD, chronic pain, anxiety who presents to the ED for evaluation of abdominal pain, N/V/D. She states her symptoms began two days ago. She endorses generalized abdominal pain that is associated with nausea. Endorses 4-5 episodes of NBNB emesis daily for the past two days. She also endorses watery, non-bloody diarrhea 2-3 times daily for the past two days. She denies sick contacts. Denies recent travel. She has tried Phenergan at home with no relief of her symptoms. She does report a history of IBS though she does not follow with GI due to finances. Denies fever, chills, dysuria, urinary frequency/urgency. Denies chest pain or SOB.   Past Medical History  Diagnosis Date  . Anxiety   . Arthritis   . GERD (gastroesophageal reflux disease)   . Depression   . Chronic pain syndrome    No past surgical history on file. Family History  Problem Relation Age of Onset  . Heart disease Father   . Diabetes Father   . Hyperlipidemia Father   . Hypertension Father   . Stroke Father   . Depression Sister   . Diabetes Sister   . Hyperlipidemia Sister   . Hypertension Sister   . Asthma Brother   . Cancer Maternal Grandmother    Social History  Substance Use Topics  . Smoking status: Never Smoker   . Smokeless tobacco: Never Used  . Alcohol Use: Yes     Comment: on holidays or special occasions   OB History    No data available     Review of Systems  All other systems reviewed and are negative.     Allergies  Review of patient's allergies indicates no known allergies.  Home Medications   Prior to Admission medications   Medication Sig Start Date End Date Taking? Authorizing Provider  amitriptyline (ELAVIL) 25 MG tablet Take  50 mg by mouth at bedtime.  07/09/14  Yes Historical Provider, MD  DUEXIS 800-26.6 MG TABS Take 1 tablet by mouth 2 (two) times daily as needed. for pain 08/25/15  Yes Historical Provider, MD  metFORMIN (GLUCOPHAGE) 500 MG tablet Take 1 tablet (500 mg total) by mouth daily with breakfast. 10/10/15  Yes Alveda Reasons, MD  MOVANTIK 25 MG TABS tablet TAKE 1 TABLET ONCE A DAY WITH A MEAL 10/21/15  Yes Historical Provider, MD  NUCYNTA 75 MG TABS Take 75 mg by mouth 3 (three) times daily.  08/15/14  Yes Historical Provider, MD  promethazine (PHENERGAN) 25 MG tablet Take 1 tablet (25 mg total) by mouth every 8 (eight) hours as needed for nausea or vomiting. 10/10/15  Yes Alveda Reasons, MD  tiZANidine (ZANAFLEX) 2 MG tablet Take 2 mg by mouth every 6 (six) hours as needed for muscle spasms.  07/09/14  Yes Historical Provider, MD  venlafaxine (EFFEXOR) 25 MG tablet TAKE 5 TABLETS BY MOUTH 3 TIMES A DAY 10/21/15  Yes Historical Provider, MD  clomiPHENE (CLOMID) 50 MG tablet Once daily for 5 days.  May repeat 30 days from beginning of cycle Patient not taking: Reported on 10/27/2015 04/25/15   Nolon Rod, DO  omeprazole (PRILOSEC) 40 MG capsule Take 1 tab po bid  x 2 weeks then once daily 10/10/15   Alveda Reasons, MD   BP 130/102 mmHg  Pulse 100  Temp(Src) 97.8 F (36.6 C) (Oral)  Resp 14  SpO2 100%  LMP 10/05/2015 Physical Exam  Constitutional: She is oriented to person, place, and time.  HENT:  Right Ear: External ear normal.  Left Ear: External ear normal.  Nose: Nose normal.  Mouth/Throat: Oropharynx is clear and moist. No oropharyngeal exudate.  Eyes: Conjunctivae and EOM are normal. Pupils are equal, round, and reactive to light.  Neck: Normal range of motion. Neck supple.  Cardiovascular: Regular rhythm, normal heart sounds and intact distal pulses.  Tachycardia present.   Pulmonary/Chest: Effort normal and breath sounds normal. No respiratory distress. She has no wheezes. She exhibits no  tenderness.  Abdominal: Soft. Bowel sounds are normal. She exhibits no distension. There is no tenderness. There is no rebound, no guarding and no CVA tenderness.  Musculoskeletal: She exhibits no edema.  Neurological: She is alert and oriented to person, place, and time. No cranial nerve deficit.  Skin: Skin is warm and dry.  Psychiatric: She has a normal mood and affect.  Nursing note and vitals reviewed.  Filed Vitals:   10/27/15 1428 10/27/15 1824 10/27/15 1958  BP: 123/92 130/102 156/99  Pulse: 119 100 98  Temp: 97.8 F (36.6 C)    TempSrc: Oral    Resp: 18 14 16   SpO2: 100% 100% 100%     ED Course  Procedures (including critical care time) Labs Review Labs Reviewed  COMPREHENSIVE METABOLIC PANEL - Abnormal; Notable for the following:    Glucose, Bld 117 (*)    Creatinine, Ser 1.01 (*)    Total Protein 8.3 (*)    All other components within normal limits  CBC - Abnormal; Notable for the following:    RBC 5.21 (*)    All other components within normal limits  URINALYSIS, ROUTINE W REFLEX MICROSCOPIC (NOT AT [facility]) - Abnormal; Notable for the following:    Color, Urine AMBER (*)    APPearance CLOUDY (*)    Bilirubin Urine SMALL (*)    Ketones, ur 40 (*)    All other components within normal limits  LIPASE, BLOOD    Imaging Review No results found. I have personally reviewed and evaluated these images and lab results as part of my medical decision-making.   EKG Interpretation None      MDM   Final diagnoses:  Non-intractable vomiting with nausea, vomiting of unspecified type  Diarrhea, unspecified type  Generalized abdominal pain    Pt with history of IBS now with 2 days of generalized abdominal pain with associated N/V/D. Labs unremarkable. Pt initially tachycardic to 119 which I suspect is 2/2 hypovolemia and pain. Improved to 98 with 1L NS bolus. Pt has nonfocal exam and with uremarkable labs I see no indication for further abdominal imaging at this time.  I suspect viral etiology. Low suspicion for appendicitis, cholecystitis, pancreatitis, SBO, or other acute intra-abdominal pathology. Pt is able to tolerate PO in the ED. She feels her pain and nausea is improved. Will give rx for zofran and Bentyl. Will give resource guide to establish PCP and refer to GI. ER return precautions given.     Anne Ng, PA-C 10/27/15 2045  Charlesetta Shanks, MD 11/08/15 1014

## 2015-10-27 NOTE — Discharge Instructions (Signed)
You were seen in the emergency room today for evaluation of nausea, vomiting, diarrhea, and abdominal pain. Your labs were unremarkable. Your symptoms are likely due to a virus and perhaps your IBS. I will give you a prescription for zofran to take as needed for nausea and Bentyl to take as needed for abdominal pain. Drink plenty of fluids to stay hydrated. Stick to a simple diet. Please follow up with your primary care provider within on week.   Return to the emergency room for worsening condition or new concerning symptoms. Follow up with your regular doctor. If you don't have a regular doctor use one of the numbers below to establish a primary care doctor.   Emergency Department Resource Guide 1) Find a Doctor and Pay Out of Pocket Although you won't have to find out who is covered by your insurance plan, it is a good idea to ask around and get recommendations. You will then need to call the office and see if the doctor you have chosen will accept you as a new patient and what types of options they offer for patients who are self-pay. Some doctors offer discounts or will set up payment plans for their patients who do not have insurance, but you will need to ask so you aren't surprised when you get to your appointment.  2) Contact Your Local Health Department Not all health departments have doctors that can see patients for sick visits, but many do, so it is worth a call to see if yours does. If you don't know where your local health department is, you can check in your phone book. The CDC also has a tool to help you locate your state's health department, and many state websites also have listings of all of their local health departments.  3) Find a [facility] If your illness is not likely to be very severe or complicated, you may want to try a walk in clinic. These are popping up all over the country in pharmacies, drugstores, and shopping centers. They're usually staffed by nurse practitioners  or physician assistants that have been trained to treat common illnesses and complaints. They're usually fairly quick and inexpensive. However, if you have serious medical issues or chronic medical problems, these are probably not your best option.  No Primary Care Doctor: - Call Health Connect at  [phone] - they can help you locate a primary care doctor that  accepts your insurance, provides certain services, etc. - Physician Referral [phone]  Emergency Department Resource Guide 1) Find a Doctor and Pay Out of Pocket Although you won't have to find out who is covered by your insurance plan, it is a good idea to ask around and get recommendations. You will then need to call the office and see if the doctor you have chosen will accept you as a new patient and what types of options they offer for patients who are self-pay. Some doctors offer discounts or will set up payment plans for their patients who do not have insurance, but you will need to ask so you aren't surprised when you get to your appointment.  2) Contact Your Local Health Department Not all health departments have doctors that can see patients for sick visits, but many do, so it is worth a call to see if yours does. If you don't know where your local health department is, you can check in your phone book. The CDC also has a tool to help you locate your state's health department,  and many state websites also have listings of all of their local health departments.  3) Find a [facility] If your illness is not likely to be very severe or complicated, you may want to try a walk in clinic. These are popping up all over the country in pharmacies, drugstores, and shopping centers. They're usually staffed by nurse practitioners or physician assistants that have been trained to treat common illnesses and complaints. They're usually fairly quick and inexpensive. However, if you have serious medical issues or chronic medical  problems, these are probably not your best option.  No Primary Care Doctor: - Call Health Connect at  [phone] - they can help you locate a primary care doctor that  accepts your insurance, provides certain services, etc. - Physician Referral Service- [phone]  Chronic Pain Problems: Organization         Address  Phone   Notes  [facility]  [phone] Patients need to be referred by their primary care doctor.   Medication Assistance: Organization         Address  Phone   Notes  [facility] Medication [facility] [address] [place], [place] [address] [phone] --Must be a resident of [facility] -- Must have NO insurance coverage whatsoever (no Medicaid/ Medicare, etc.) -- The pt. MUST have a primary care doctor that directs their care regularly and follows them in the community   [facility]  [phone]   [employer]  [phone]    Agencies that provide inexpensive medical care: Organization         Address  Phone   Notes  [place]  [phone]   [facility]    [phone]   [facility] [place], [place] [address] [phone]   [place] [address], [place] [phone]   [facility]anned Parenthood    [phone]   [facility]    [phone]   [place] and [place] [address], [place] Phone:  [phone], Fax:  [phone] Hours of Operation:  9 am - 6 pm, M-F.  Also accepts Medicaid/Medicare and self-pay.  [facility] [place] for [place], [address], [place] Phone: [phone], Fax: [phone]. Hours of Operation:  8:30 am - 5:30 pm, M-F.  Also accepts Medicaid and self-pay.  [facility] [place] [address], [place] Phone: [phone]   [place], [place], [place] [phone], Ext. [phone]  Mondays & Thursdays: 7-9 AM.  First 15 patients are seen on a first come, first serve basis.    [place] Providers:  Organization         Address  Phone   Notes  [facility] [address], [place] [phone] Also accepts self-pay patients.  [facility] [id number] [place], [place]  [phone]   [place], [address], [place] [phone]   [facility] [address] [place] [phone]   Lucianne Lei [address], [place]   [phone] Only accepts [place] patients after they have their name applied to their card.   Self-Pay (no insurance) in [facility] [place]:  Organization  Address  Phone   Notes  Sickle Cell Patients, [place] [facility] [place] [phone]   [facility] [place] [phone]   [facility] [place]  [place], [address], [place] [phone]   [facility]/Dr. Osei-Bonsu  [address] [place] or [address], [place] [phone] Phone number for both [place] and [place] locations is the same.  [facility] [address] [place] [phone]   [facility] [address] [place] or [address] [phone]   [facility] [address], [place] [phone], phone; [phone], fax Sees patients 1st and 3rd Saturday of every month.  Must not qualify for public or private insurance (i.e. Medicaid, Medicare, [place] Health Choice, Veterans' Benefits)  Household income should be no more than 200% of the poverty level The clinic cannot treat you if you are pregnant or think you are pregnant  Sexually transmitted diseases are not treated at  the clinic.

## 2015-11-12 ENCOUNTER — Telehealth: Payer: Self-pay | Admitting: Family Medicine

## 2015-11-12 ENCOUNTER — Other Ambulatory Visit: Payer: Self-pay | Admitting: Family Medicine

## 2015-11-12 DIAGNOSIS — E282 Polycystic ovarian syndrome: Secondary | ICD-10-CM

## 2015-11-12 DIAGNOSIS — Z3169 Encounter for other general counseling and advice on procreation: Secondary | ICD-10-CM

## 2015-11-12 NOTE — Telephone Encounter (Signed)
Forwarding to PCP as well as doctor that saw pt in December.  I did see in the note that a referral was mentioned but did not see it in the referral tab. Katharina Caper, Ebert Forrester D, [place]

## 2015-11-12 NOTE — Telephone Encounter (Signed)
Referral should have been placed.  I have put this order in today.

## 2015-11-12 NOTE — Telephone Encounter (Signed)
Routing to Team.

## 2015-11-12 NOTE — Telephone Encounter (Signed)
Patient was seen by Dr Mingo Amber.   However, his note does seem to suggest that he intended to refer patient.  Will place.  Please advise patient.

## 2015-11-12 NOTE — Telephone Encounter (Signed)
Spoke with patient and informed her that referral has been placed and that she will be hearing from them for an appt. Jazmin Hartsell,CMA

## 2015-11-12 NOTE — Telephone Encounter (Signed)
Pt called and would like a referral to [facility]. She said that when she was seen in December she thought that they were doing the referral then. Please call when done. jw

## 2015-11-20 ENCOUNTER — Other Ambulatory Visit: Payer: Self-pay | Admitting: Family Medicine

## 2015-11-20 MED ORDER — METFORMIN HCL 500 MG PO TABS: 500.0000 mg | ORAL_TABLET | Freq: Every day | ORAL | Status: DC

## 2015-11-20 NOTE — Telephone Encounter (Signed)
Pt calling for an authorization to have her metFORMIN (GLUCOPHAGE) 500 MG tablet refilled. Heather Bowman, ASA

## 2015-11-28 NOTE — Telephone Encounter (Signed)
FA will only cover appt with [facility].  [facility] is where patient would like to go, then they will not need a referral from our office since patient would be cash pay. Jazmin Hartsell,CMA

## 2015-11-28 NOTE — Telephone Encounter (Signed)
Pt was given the message about the referral to womens. 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 She is assuming financial assistance will pay for infertility

## 2015-12-24 ENCOUNTER — Ambulatory Visit: Payer: No Typology Code available for payment source

## 2016-04-14 ENCOUNTER — Other Ambulatory Visit: Payer: Self-pay | Admitting: *Deleted

## 2016-04-14 NOTE — Telephone Encounter (Signed)
Patient needing a refill on her promethazine.  Will forward to MD to advise. Rasheida Broden,CMA

## 2016-04-15 ENCOUNTER — Other Ambulatory Visit: Payer: Self-pay | Admitting: Family Medicine

## 2016-04-15 MED ORDER — PROMETHAZINE HCL 25 MG PO TABS
25.0000 mg | ORAL_TABLET | Freq: Three times a day (TID) | ORAL | Status: DC | PRN
Start: 2016-04-15 — End: 2016-04-19

## 2016-04-15 NOTE — Telephone Encounter (Signed)
rx sent to [facility]

## 2016-04-19 ENCOUNTER — Other Ambulatory Visit: Payer: Self-pay | Admitting: Family Medicine

## 2016-04-19 MED ORDER — PROMETHAZINE HCL 25 MG PO TABS: 25.0000 mg | ORAL_TABLET | Freq: Three times a day (TID) | ORAL | Status: DC | PRN

## 2016-04-19 NOTE — Telephone Encounter (Signed)
Return call to patient regarding Promethazine.  [facility], but [facility] have [facility] to [place].  [facility]  Medication faxed to [place].  Derl Barrow, RN

## 2016-04-19 NOTE — Telephone Encounter (Signed)
[facility]  [facility]  Please resend it to the [facility].   Pt has been out of this medication for 2 weeks.

## 2016-04-19 NOTE — Telephone Encounter (Signed)
Pt wants to speak with the Triage nurse and explain her situation. She doesn't want to wait for the dr to return her call

## 2016-05-12 ENCOUNTER — Encounter: Payer: No Typology Code available for payment source | Admitting: Family Medicine

## 2016-05-14 ENCOUNTER — Encounter: Payer: Self-pay | Admitting: Internal Medicine

## 2016-05-14 ENCOUNTER — Ambulatory Visit (INDEPENDENT_AMBULATORY_CARE_PROVIDER_SITE_OTHER): Payer: No Typology Code available for payment source | Admitting: Internal Medicine

## 2016-05-14 VITALS — BP 144/86 | HR 100 | Temp 98.3°F | Ht 65.0 in | Wt 183.0 lb

## 2016-05-14 DIAGNOSIS — K219 Gastro-esophageal reflux disease without esophagitis: Secondary | ICD-10-CM

## 2016-05-14 DIAGNOSIS — R112 Nausea with vomiting, unspecified: Secondary | ICD-10-CM

## 2016-05-14 DIAGNOSIS — H9203 Otalgia, bilateral: Secondary | ICD-10-CM | POA: Insufficient documentation

## 2016-05-14 MED ORDER — PROMETHAZINE HCL 25 MG PO TABS: 25.0000 mg | ORAL_TABLET | Freq: Three times a day (TID) | ORAL | Status: DC | PRN

## 2016-05-14 MED ORDER — OMEPRAZOLE 40 MG PO CPDR: DELAYED_RELEASE_CAPSULE | ORAL | Status: DC

## 2016-05-14 MED ORDER — LIDOCAINE 4 % EX CREA: 1.0000 "application " | TOPICAL_CREAM | CUTANEOUS | Status: DC | PRN

## 2016-05-14 MED ORDER — LIDOCAINE 4 % EX GEL: 1.0000 "application " | Freq: Two times a day (BID) | CUTANEOUS | Status: DC | PRN

## 2016-05-14 NOTE — Assessment & Plan Note (Addendum)
Chronic and persistent with nausea and vomiting. No red flags on history or exam.  -Rx for Phenergan and Omeprazole refills given  -GI referral placed today

## 2016-05-14 NOTE — Patient Instructions (Signed)
I have placed the referral for GI. We will get back to you if they are able to see you with the orange card.   For your ears, continue with Tylenol and Ibuprofen for pain. I have prescribed the lidocaine. You should not use it if there is active drainage/signs of infection to the area. Disinfect the earrings and keep your ears very clean.   Follow up with Dr. Lajuana Ripple as needed.

## 2016-05-14 NOTE — Assessment & Plan Note (Addendum)
Related to piercing. Would have expected these areas to have healed by now. Suspect "knots" are localized inflammation secondary to irritation. Without signs of infection on exam today.  -lidocaine cream for local pain relief, not to use if signs of infection  -tylenol/ibuprofen prn  -recommended frequent disinfection of earrings, good hygiene for areas of piercing, and frequent removal of earrings  -discussed that she might need gold or sterling silver earrings as she may have a metal sensitivity

## 2016-05-14 NOTE — Progress Notes (Signed)
   Subjective:    Heather Bowman - 35 y.o. female MRN [id number]  Date of birth: 1981/03/13  HPI  Heather Bowman presents with several concerns.  Nausea: Persistent issue for several years. Chronically on Phenergan daily. Also takes chronic narcotic pain medications as prescribed for pain management. Nausea almost every day per week. Vomiting occurs a few times per week. Worsened by spicy foods, hot foods, certain dairy products, caffeine/coffee. Has been diagnosed with IBS and GERD in the past. Currently taking Omeprazole daily. Denies abdominal pain and weight loss. Has episode in June that lasted 3-4 days with persistent vomiting. Reports this happens every few months even with her medications; if without her medications would have these episodes weekly. Interested in seeing GI.   Pain with Ear Piercings: Reports she had pinna piercings done at [facility] in April. Since then, she has had on and off pain at the sites. She sometimes notices "knots" in the area of the piercing that go down on their own. Has had drainage from the area previously but none recently. Has tried OTC numbing sprays without relief. Is trying to do a better job of keeping the earrings clean and removing them for periods of time.   -  reports that she has never smoked. She has never used smokeless tobacco. - Review of Systems: Per HPI. - Past Medical History: Patient Active Problem List   Diagnosis Date Noted  . Painful pinna of both ears on examination 05/14/2016  . Infertility counseling 03/21/2015  . Axillary adenitis 08/07/2013  . Excessive sweating 06/13/2013  . PCOS (polycystic ovarian syndrome) 05/26/2012  . GAD (generalized anxiety disorder) 05/02/2012  . Chronic pain syndrome 05/02/2012  . GERD (gastroesophageal reflux disease) 05/02/2012   - Medications: reviewed and updated    Objective:   Physical Exam BP 144/86 mmHg  Pulse 100  Temp(Src) 98.3 F (36.8 C) (Oral)  Ht 5\' 5"  (1.651 m)  Wt 183 lb (83.008  kg)  BMI 30.45 kg/m2  LMP 04/17/2016 Gen: NAD, alert, cooperative with exam, well-appearing HEENT: Pinna piercing present bilaterally. Very mild swelling on posterior left pinna at ear piercing. Otherwise no drainage, erythema or ulceration present. Mild TTP over earrings.  CV: RRR, good S1/S2, no murmur, no edema, capillary refill brisk  Resp: CTABL, no wheezes, non-labored Abd: SNTND, BS present, no guarding or organomegaly Skin: no rashes, normal turgor  Neuro: no gross deficits.  Psych: good insight, alert and oriented     Assessment & Plan:   Painful pinna of both ears on examination Related to piercing. Would have expected these areas to have healed by now. Suspect "knots" are localized inflammation secondary to irritation. Without signs of infection on exam today.  -lidocaine cream for local pain relief, not to use if signs of infection  -tylenol/ibuprofen prn  -recommended frequent disinfection of earrings, good hygiene for areas of piercing, and frequent removal of earrings  -discussed that she might need gold or sterling silver earrings as she may have a metal sensitivity   GERD (gastroesophageal reflux disease) Chronic and persistent with nausea and vomiting. No red flags on history or exam.  -Rx for Phenergan and Omeprazole refills given  -GI referral placed today      Phill Myron, D.O. 05/14/2016, 12:11 PM PGY-2, [place]

## 2016-08-23 ENCOUNTER — Other Ambulatory Visit: Payer: Self-pay | Admitting: *Deleted

## 2016-12-03 ENCOUNTER — Ambulatory Visit (INDEPENDENT_AMBULATORY_CARE_PROVIDER_SITE_OTHER): Payer: BLUE CROSS/BLUE SHIELD | Admitting: Nurse Practitioner

## 2016-12-03 ENCOUNTER — Encounter: Payer: Self-pay | Admitting: Nurse Practitioner

## 2016-12-03 ENCOUNTER — Other Ambulatory Visit (INDEPENDENT_AMBULATORY_CARE_PROVIDER_SITE_OTHER): Payer: BLUE CROSS/BLUE SHIELD

## 2016-12-03 VITALS — BP 132/82 | HR 96 | Temp 98.0°F | Ht 65.0 in | Wt 187.0 lb

## 2016-12-03 DIAGNOSIS — Z0000 Encounter for general adult medical examination without abnormal findings: Secondary | ICD-10-CM

## 2016-12-03 DIAGNOSIS — R2231 Localized swelling, mass and lump, right upper limb: Secondary | ICD-10-CM

## 2016-12-03 DIAGNOSIS — K5903 Drug induced constipation: Secondary | ICD-10-CM

## 2016-12-03 DIAGNOSIS — K219 Gastro-esophageal reflux disease without esophagitis: Secondary | ICD-10-CM | POA: Diagnosis not present

## 2016-12-03 DIAGNOSIS — K5900 Constipation, unspecified: Secondary | ICD-10-CM | POA: Insufficient documentation

## 2016-12-03 DIAGNOSIS — E282 Polycystic ovarian syndrome: Secondary | ICD-10-CM | POA: Diagnosis not present

## 2016-12-03 DIAGNOSIS — R110 Nausea: Secondary | ICD-10-CM

## 2016-12-03 LAB — LIPID PANEL
Cholesterol: 270 mg/dL — ABNORMAL HIGH (ref 0–200)
HDL: 67.8 mg/dL (ref >39.00)
LDL CALC: 175 mg/dL — AB (ref 0–99)
NONHDL: 202.4
Total CHOL/HDL Ratio: 4
Triglycerides: 138 mg/dL (ref 0.0–149.0)
VLDL: 27.6 mg/dL (ref 0.0–40.0)

## 2016-12-03 LAB — COMPREHENSIVE METABOLIC PANEL
ALT: 12 U/L (ref 0–35)
AST: 14 U/L (ref 0–37)
Albumin: 4.6 g/dL (ref 3.5–5.2)
Alkaline Phosphatase: 44 U/L (ref 39–117)
BUN: 14 mg/dL (ref 6–23)
CO2: 31 meq/L (ref 19–32)
CREATININE: 0.97 mg/dL (ref 0.40–1.20)
Calcium: 9.6 mg/dL (ref 8.4–10.5)
Chloride: 103 mEq/L (ref 96–112)
GFR: 68.44 mL/min (ref >60.00)
Glucose, Bld: 93 mg/dL (ref 70–99)
Potassium: 4.1 mEq/L (ref 3.5–5.1)
SODIUM: 138 meq/L (ref 135–145)
Total Bilirubin: 0.6 mg/dL (ref 0.2–1.2)
Total Protein: 8 g/dL (ref 6.0–8.3)

## 2016-12-03 LAB — TSH: TSH: 1.26 u[IU]/mL (ref 0.35–4.50)

## 2016-12-03 LAB — CBC WITH DIFFERENTIAL/PLATELET
Basophils Absolute: 0 10*3/uL (ref 0.0–0.1)
Basophils Relative: 0.6 % (ref 0.0–3.0)
EOS ABS: 0.1 10*3/uL (ref 0.0–0.7)
Eosinophils Relative: 1.3 % (ref 0.0–5.0)
HCT: 41.5 % (ref 36.0–46.0)
Hemoglobin: 13.8 g/dL (ref 12.0–15.0)
Lymphocytes Relative: 37.5 % (ref 12.0–46.0)
Lymphs Abs: 2.1 10*3/uL (ref 0.7–4.0)
MCHC: 33.3 g/dL (ref 30.0–36.0)
MCV: 84.2 fl (ref 78.0–100.0)
MONO ABS: 0.5 10*3/uL (ref 0.1–1.0)
Monocytes Relative: 8.4 % (ref 3.0–12.0)
NEUTROS ABS: 2.9 10*3/uL (ref 1.4–7.7)
Neutrophils Relative %: 52.2 % (ref 43.0–77.0)
PLATELETS: 282 10*3/uL (ref 150.0–400.0)
RBC: 4.93 Mil/uL (ref 3.87–5.11)
RDW: 13 % (ref 11.5–15.5)
WBC: 5.5 10*3/uL (ref 4.0–10.5)

## 2016-12-03 MED ORDER — PROMETHAZINE HCL 25 MG PO TABS: 25.0000 mg | ORAL_TABLET | Freq: Three times a day (TID) | ORAL | 0 refills | Status: DC | PRN

## 2016-12-03 MED ORDER — LINACLOTIDE 145 MCG PO CAPS: 145.0000 ug | ORAL_CAPSULE | Freq: Every day | ORAL | 1 refills | Status: DC | PRN

## 2016-12-03 MED ORDER — PANTOPRAZOLE SODIUM 40 MG PO TBEC: 40.0000 mg | DELAYED_RELEASE_TABLET | Freq: Every day | ORAL | 3 refills | Status: DC

## 2016-12-03 MED ORDER — SACCHAROMYCES BOULARDII 250 MG PO CAPS: 250.0000 mg | ORAL_CAPSULE | Freq: Two times a day (BID) | ORAL | 1 refills | Status: DC

## 2016-12-03 NOTE — Progress Notes (Signed)
Pre visit review using our clinic review tool, if applicable. No additional management support is needed unless otherwise documented below in the visit note. 

## 2016-12-03 NOTE — Progress Notes (Signed)
Subjective:    Patient ID: Heather Bowman, female    DOB: [birth year], 36 y.o.   MRN: [id number]  Patient presents today for establish care (new patient)  Gastroesophageal Reflux  She complains of belching, globus sensation, heartburn, nausea, tooth decay and water brash. She reports no abdominal pain, no chest pain, no choking, no coughing, no dysphagia, no early satiety, no hoarse voice, no sore throat, no stridor or no wheezing. This is a recurrent problem. The current episode started more than 1 month ago. The problem occurs constantly. The problem has been waxing and waning. The heartburn is located in the substernum. The heartburn does not wake her from sleep. The heartburn does not limit her activity. The heartburn doesn't change with position. The symptoms are aggravated by certain foods and lying down. Pertinent negatives include no anemia, fatigue, melena, muscle weakness, orthopnea or weight loss. Risk factors include caffeine use, lack of exercise and obesity. She has tried an antacid and a PPI (ppi used prn) for the symptoms. The treatment provided mild relief. Past procedures do not include an abdominal ultrasound, an EGD, esophageal pH monitoring, H. pylori antibody titer or a UGI.  Rash  This is a chronic (right axilla mass) problem. The current episode started more than 1 year ago. The problem has been waxing and waning since onset. The affected locations include the right axilla. The rash is characterized by swelling. It is unknown if there was an exposure to a precipitant. Pertinent negatives include no anorexia, congestion, cough, diarrhea, eye pain, facial edema, fatigue, fever, joint pain, nail changes, rhinorrhea, shortness of breath, sore throat or vomiting. Past treatments include moisturizer (warm compress, chnage of personal products).  Constipation  This is a chronic problem. The current episode started more than 1 year ago. The problem has been waxing and waning since onset.  Her stool frequency is 1 time per week or less. The stool is described as formed and pellet like. The patient is not on a high fiber diet. She does not exercise regularly. There has not been adequate water intake. Associated symptoms include bloating and nausea. Pertinent negatives include no abdominal pain, anorexia, diarrhea, difficulty urinating, fecal incontinence, fever, flatus, hematochezia, hemorrhoids, melena, rectal pain, vomiting or weight loss. Risk factors include change in medication usage/dosage (opiod use). She has tried laxatives for the symptoms. The treatment provided mild relief. Her past medical history is significant for irritable bowel syndrome and psychiatric history. There is no history of abdominal surgery, endocrine disease, inflammatory bowel disease, metabolic disease, neurologic disease, neuromuscular disease or radiation treatment.  unable to afford movantik.  Immunizations: (TDAP, Hep C screen, Pneumovax, Influenza, zoster)  Health Maintenance  Topic Date Due  . Flu Shot  06/01/2016  . Pap Smear  08/07/2016  . Tetanus Vaccine  05/09/2022  . HIV Screening  Completed   Diet:regular Weight:  Wt Readings from Last 3 Encounters:  12/03/16 187 lb (84.8 kg)  05/14/16 183 lb (83 kg)  10/10/15 177 lb (80.3 kg)   Exercise:none Fall Risk: Fall Risk  12/03/2016 05/14/2016 12/20/2014 04/15/2014 08/22/2013  Falls in the past year? No No No No No   Home Safety:home alone Depression/Suicide: under behavioral health care Depression screen [facility] 2/9 12/03/2016 05/14/2016 12/20/2014 09/10/2014 04/15/2014 11/08/2013 11/08/2013  Decreased Interest 3 1 0 0 0 0 0  Down, Depressed, Hopeless 3 1 0 0 0 0 0  PHQ - 2 Score 6 2 0 0 0 0 0  Altered sleeping 3 - - - - - -  Tired, decreased energy 3 - - - - - -  Change in appetite 3 - - - - - -  Feeling bad or failure about yourself  3 - - - - - -  Trouble concentrating 3 - - - - - -  Moving slowly or fidgety/restless 1 - - - - - -  Suicidal  thoughts 0 - - - - - -  PHQ-9 Score 22 - - - - - -   No flowsheet data found. Pap Smear (every 74yrs for >21-29 without HPV, every 36yrs for >30-39yrs with HPV):deferred to GYN by patient Vision:needed Dental:needed Advanced Directive: Advanced Directives 05/14/2016  Does Patient Have a Medical Advance Directive? No  Would patient like information on creating a medical advance directive? Yes - Scientist, clinical (histocompatibility and immunogenetics) given   Sexual History (birth control, marital status, STD):single, sexually active, no birth control.  Medications and allergies reviewed with patient and updated if appropriate.  Patient Active Problem List   Diagnosis Date Noted  . Constipation 12/03/2016  . Axillary mass, right 12/03/2016  . Painful pinna of both ears on examination 05/14/2016  . Infertility counseling 03/21/2015  . Axillary adenitis 08/07/2013  . Excessive sweating 06/13/2013  . Nausea 01/17/2013  . PCOS (polycystic ovarian syndrome) 05/26/2012  . GAD (generalized anxiety disorder) 05/02/2012  . Chronic pain syndrome 05/02/2012  . GERD (gastroesophageal reflux disease) 05/02/2012    Current Outpatient Prescriptions on File Prior to Visit  Medication Sig Dispense Refill  . amitriptyline (ELAVIL) 25 MG tablet Take 50 mg by mouth at bedtime.   0  . metFORMIN (GLUCOPHAGE) 500 MG tablet Take 1 tablet (500 mg total) by mouth daily with breakfast. (Patient taking differently: Take 1,000 mg by mouth daily with breakfast. ) 30 tablet 6  . NUCYNTA 75 MG TABS Take 75 mg by mouth 3 (three) times daily.   0  . DUEXIS 800-26.6 MG TABS Take 1 tablet by mouth 2 (two) times daily as needed. for pain  2  . venlafaxine (EFFEXOR) 25 MG tablet TAKE 5 TABLETS BY MOUTH 3 TIMES A DAY  2   No current facility-administered medications on file prior to visit.     Past Medical History:  Diagnosis Date  . Anxiety   . Arthritis   . Chronic pain syndrome   . Depression   . GERD (gastroesophageal reflux disease)      History reviewed. No pertinent surgical history.  Social History   Social History  . Marital status: Single    Spouse name: N/A  . Number of children: N/A  . Years of education: N/A   Social History Main Topics  . Smoking status: Never Smoker  . Smokeless tobacco: Never Used  . Alcohol use Yes     Comment: on holidays or special occasions  . Drug use: No  . Sexual activity: Yes    Birth control/ protection: None     Comment: one partner for over 4 years, patient says she is infertile   Other Topics Concern  . None   Social History Narrative  . None    Family History  Problem Relation Age of Onset  . Heart disease Father   . Diabetes Father   . Hyperlipidemia Father   . Hypertension Father   . Stroke Father   . Depression Sister   . Diabetes Sister   . Hyperlipidemia Sister   . Hypertension Sister   . Asthma Brother   . Cancer Maternal Grandmother  Review of Systems  Constitutional: Negative for fatigue, fever, malaise/fatigue and weight loss.  HENT: Negative for congestion, hoarse voice, rhinorrhea and sore throat.   Eyes: Negative for pain.       Negative for visual changes  Respiratory: Negative for cough, choking, shortness of breath and wheezing.   Cardiovascular: Negative for chest pain, palpitations and leg swelling.  Gastrointestinal: Positive for bloating, constipation, heartburn and nausea. Negative for abdominal pain, anorexia, blood in stool, diarrhea, dysphagia, flatus, hematochezia, hemorrhoids, melena, rectal pain and vomiting.  Genitourinary: Negative for difficulty urinating, dysuria, frequency and urgency.  Musculoskeletal: Negative for falls, joint pain, myalgias and muscle weakness.  Skin: Negative for nail changes and rash.  Neurological: Negative for dizziness, sensory change and headaches.  Endo/Heme/Allergies: Does not bruise/bleed easily.  Psychiatric/Behavioral: Positive for depression. Negative for substance abuse and  suicidal ideas. The patient is nervous/anxious.     Objective:   Vitals:   12/03/16 1423  BP: 132/82  Pulse: 96  Temp: 98 F (36.7 C)    Body mass index is 31.12 kg/m.   Physical Examination:  Physical Exam  Constitutional: She is oriented to person, place, and time and well-developed, well-nourished, and in no distress. No distress.  HENT:  Right Ear: External ear normal.  Left Ear: External ear normal.  Nose: Nose normal.  Mouth/Throat: Oropharynx is clear and moist. No oropharyngeal exudate.  Eyes: Conjunctivae and EOM are normal. Pupils are equal, round, and reactive to light. No scleral icterus.  Neck: Normal range of motion. Neck supple. No thyromegaly present.  Cardiovascular: Normal rate, normal heart sounds and intact distal pulses.   Pulmonary/Chest: Effort normal and breath sounds normal. She exhibits no tenderness.  Abdominal: Soft. Bowel sounds are normal. She exhibits no distension. There is no tenderness.  Musculoskeletal: Normal range of motion. She exhibits no edema or tenderness.  Lymphadenopathy:    She has no cervical adenopathy.  Neurological: She is alert and oriented to person, place, and time. Gait normal.  Skin: Skin is warm and dry. Lesion and rash noted. Rash is nodular. No erythema.     Psychiatric: Affect and judgment normal.    ASSESSMENT and PLAN:  Allis was seen today for establish care.  Diagnoses and all orders for this visit:  Encounter for medical examination to establish care  PCOS (polycystic ovarian syndrome) -     Comprehensive metabolic panel; Future -     TSH; Future -     Lipid panel; Future  Axillary mass, right -     US BREAST LTD UNI RIGHT INC AXILLA; Future -     Comprehensive metabolic panel; Future -     TSH; Future -     Lipid panel; Future  Gastroesophageal reflux disease, esophagitis presence not specified -     CBC w/Diff; Future -     Comprehensive metabolic panel; Future -     TSH; Future -      Lipid panel; Future -     pantoprazole (PROTONIX) 40 MG tablet; Take 1 tablet (40 mg total) by mouth daily.  Drug-induced constipation -     CBC w/Diff; Future -     Comprehensive metabolic panel; Future -     TSH; Future -     Lipid panel; Future -     saccharomyces boulardii (FLORASTOR) 250 MG capsule; Take 1 capsule (250 mg total) by mouth 2 (two) times daily. -     linaclotide (LINZESS) 145 MCG CAPS capsule; Take 1 capsule (145 mcg  total) by mouth daily as needed. With food  Nausea -     promethazine (PHENERGAN) 25 MG tablet; Take 1 tablet (25 mg total) by mouth every 8 (eight) hours as needed for nausea or vomiting.   No problem-specific Assessment & Plan notes found for this encounter.     Follow up: Return in about 3 weeks (around 12/24/2016) for constipation and GERD.Marland Kitchen  Wilfred Lacy, NP

## 2016-12-03 NOTE — Patient Instructions (Addendum)
Refer to GI if no improvement in 3weeks.  Food Choices for Gastroesophageal Reflux Disease, Adult When you have gastroesophageal reflux disease (GERD), the foods you eat and your eating habits are very important. Choosing the right foods can help ease your discomfort. What guidelines do I need to follow?  Choose fruits, vegetables, whole grains, and low-fat dairy products.  Choose low-fat meat, fish, and poultry.  Limit fats such as oils, salad dressings, butter, nuts, and avocado.  Keep a food diary. This helps you identify foods that cause symptoms.  Avoid foods that cause symptoms. These may be different for everyone.  Eat small meals often instead of 3 large meals a day.  Eat your meals slowly, in a place where you are relaxed.  Limit fried foods.  Cook foods using methods other than frying.  Avoid drinking alcohol.  Avoid drinking large amounts of liquids with your meals.  Avoid bending over or lying down until 2-3 hours after eating. What foods are not recommended? These are some foods and drinks that may make your symptoms worse: Vegetables  Tomatoes. Tomato juice. Tomato and spaghetti sauce. Chili peppers. Onion and garlic. Horseradish. Fruits  Oranges, grapefruit, and lemon (fruit and juice). Meats  High-fat meats, fish, and poultry. This includes hot dogs, ribs, ham, sausage, salami, and bacon. Dairy  Whole milk and chocolate milk. Sour cream. Cream. Butter. Ice cream. Cream cheese. Drinks  Coffee and tea. Bubbly (carbonated) drinks or energy drinks. Condiments  Hot sauce. Barbecue sauce. Sweets/Desserts  Chocolate and cocoa. Donuts. Peppermint and spearmint. Fats and Oils  High-fat foods. This includes [place] fries and potato chips. Other  Vinegar. Strong spices. This includes black pepper, white pepper, red pepper, cayenne, curry powder, cloves, ginger, and chili powder. The items listed above may not be a complete list of foods and drinks to avoid.  Contact your dietitian for more information.  This information is not intended to replace advice given to you by your health care provider. Make sure you discuss any questions you have with your health care provider. Document Released: 04/18/2012 Document Revised: 03/25/2016 Document Reviewed: 08/22/2013 Elsevier Interactive Patient Education  2017 [employer].   Constipation, Adult Constipation is when a person has fewer bowel movements in a week than normal, has difficulty having a bowel movement, or has stools that are dry, hard, or larger than normal. Constipation may be caused by an underlying condition. It may become worse with age if a person takes certain medicines and does not take in enough fluids. Follow these instructions at home: Eating and drinking  Eat foods that have a lot of fiber, such as fresh fruits and vegetables, whole grains, and beans.  Limit foods that are high in fat, low in fiber, or overly processed, such as french fries, hamburgers, cookies, candies, and soda.  Drink enough fluid to keep your urine clear or pale yellow. General instructions  Exercise regularly or as told by your health care provider.  Go to the restroom when you have the urge to go. Do not hold it in.  Take over-the-counter and prescription medicines only as told by your health care provider. These include any fiber supplements.  Practice pelvic floor retraining exercises, such as deep breathing while relaxing the lower abdomen and pelvic floor relaxation during bowel movements.  Watch your condition for any changes.  Keep all follow-up visits as told by your health care provider. This is important. Contact a health care provider if:  You have pain that gets worse.  You have a fever.  You do not have a bowel movement after 4 days.  You vomit.  You are not hungry.  You lose weight.  You are bleeding from the anus.  You have thin, pencil-like stools. Get help right away  if:  You have a fever and your symptoms suddenly get worse.  You leak stool or have blood in your stool.  Your abdomen is bloated.  You have severe pain in your abdomen.  You feel dizzy or you faint. This information is not intended to replace advice given to you by your health care provider. Make sure you discuss any questions you have with your health care provider. Document Released: 07/16/2004 Document Revised: 05/07/2016 Document Reviewed: 04/07/2016 Elsevier Interactive Patient Education  2017 [employer].

## 2016-12-10 ENCOUNTER — Other Ambulatory Visit: Payer: Self-pay | Admitting: Nurse Practitioner

## 2016-12-10 DIAGNOSIS — R2231 Localized swelling, mass and lump, right upper limb: Secondary | ICD-10-CM

## 2016-12-16 ENCOUNTER — Other Ambulatory Visit: Payer: Self-pay | Admitting: Nurse Practitioner

## 2016-12-16 ENCOUNTER — Ambulatory Visit
Admission: RE | Admit: 2016-12-16 | Discharge: 2016-12-16 | Disposition: A | Discharge status: Home / Self Care | Payer: BLUE CROSS/BLUE SHIELD | Source: Ambulatory Visit | Attending: Nurse Practitioner | Admitting: Nurse Practitioner

## 2016-12-16 DIAGNOSIS — I889 Nonspecific lymphadenitis, unspecified: Secondary | ICD-10-CM

## 2016-12-16 DIAGNOSIS — R2231 Localized swelling, mass and lump, right upper limb: Secondary | ICD-10-CM

## 2016-12-16 HISTORY — DX: Unspecified lump in unspecified breast: N63.0

## 2016-12-27 ENCOUNTER — Ambulatory Visit: Payer: BLUE CROSS/BLUE SHIELD | Admitting: Nurse Practitioner

## 2016-12-30 ENCOUNTER — Encounter: Payer: Self-pay | Admitting: Nurse Practitioner

## 2016-12-30 ENCOUNTER — Other Ambulatory Visit: Payer: Self-pay | Admitting: General Surgery

## 2016-12-30 ENCOUNTER — Ambulatory Visit (INDEPENDENT_AMBULATORY_CARE_PROVIDER_SITE_OTHER): Payer: BLUE CROSS/BLUE SHIELD | Admitting: Nurse Practitioner

## 2016-12-30 VITALS — BP 130/90 | HR 104 | Temp 98.5°F | Ht 65.0 in | Wt 193.1 lb

## 2016-12-30 DIAGNOSIS — K5903 Drug induced constipation: Secondary | ICD-10-CM

## 2016-12-30 DIAGNOSIS — N840 Polyp of corpus uteri: Secondary | ICD-10-CM | POA: Insufficient documentation

## 2016-12-30 DIAGNOSIS — K219 Gastro-esophageal reflux disease without esophagitis: Secondary | ICD-10-CM

## 2016-12-30 DIAGNOSIS — E282 Polycystic ovarian syndrome: Secondary | ICD-10-CM

## 2016-12-30 DIAGNOSIS — N921 Excessive and frequent menstruation with irregular cycle: Secondary | ICD-10-CM | POA: Diagnosis not present

## 2016-12-30 MED ORDER — PRENATAL VITAMIN PLUS LOW IRON 27-1 MG PO TABS: 1.0000 | ORAL_TABLET | Freq: Every day | ORAL | Status: DC

## 2016-12-30 MED ORDER — NALOXEGOL OXALATE 25 MG PO TABS: 25.0000 mg | ORAL_TABLET | Freq: Every day | ORAL | 3 refills | Status: DC

## 2016-12-30 MED ORDER — PANTOPRAZOLE SODIUM 40 MG PO TBEC: 40.0000 mg | DELAYED_RELEASE_TABLET | Freq: Every day | ORAL | 3 refills | Status: DC

## 2016-12-30 NOTE — Progress Notes (Signed)
Pre visit review using our clinic review tool, if applicable. No additional management support is needed unless otherwise documented below in the visit note. 

## 2016-12-30 NOTE — Patient Instructions (Signed)

## 2016-12-30 NOTE — Progress Notes (Signed)
Subjective:  Patient ID: Heather Bowman, female    DOB: 12-21-1980  Age: 36 y.o. MRN: [id number]  CC: Follow-up   Constipation  This is a chronic problem. The problem is unchanged. The stool is described as firm, formed and pellet like. The patient is on a high fiber diet. She does not exercise regularly. There has been adequate water intake. Associated symptoms include bloating, flatus and nausea. Pertinent negatives include no abdominal pain, anorexia, back pain, diarrhea, difficulty urinating, fecal incontinence, fever, hematochezia, hemorrhoids, melena, rectal pain, vomiting or weight loss. Risk factors include obesity.  no improvement with linzess 145mg  Used movantix in past with improvement. Last BM 3days ago.    GERD: significant improvement with Protonix.  Endometrial polyp with menorrhagia: Has pending appt for hysteroscopy and polypectomy with no myomectomy. To be done by Dr. Posey Pronto with Reproductive Medicine at [facility].  Axillary mass: Has pending appt for removal of mass of general surgery.  Outpatient Medications Prior to Visit  Medication Sig Dispense Refill  . amitriptyline (ELAVIL) 25 MG tablet Take 50 mg by mouth at bedtime.   0  . escitalopram (LEXAPRO) 10 MG tablet Take 20 mg by mouth 1 day or 1 dose.    . NUCYNTA 75 MG TABS Take 75 mg by mouth 3 (three) times daily.   0  . promethazine (PHENERGAN) 25 MG tablet Take 1 tablet (25 mg total) by mouth every 8 (eight) hours as needed for nausea or vomiting. 30 tablet 0  . tiZANidine (ZANAFLEX) 4 MG tablet Take 4 mg by mouth 3 (three) times daily as needed.  2  . linaclotide (LINZESS) 145 MCG CAPS capsule Take 1 capsule (145 mcg total) by mouth daily as needed. With food 30 capsule 1  . metFORMIN (GLUCOPHAGE) 500 MG tablet Take 1 tablet (500 mg total) by mouth daily with breakfast. (Patient taking differently: Take 1,000 mg by mouth daily with breakfast. ) 30 tablet 6  . pantoprazole (PROTONIX) 40  MG tablet Take 1 tablet (40 mg total) by mouth daily. 30 tablet 3  . DUEXIS 800-26.6 MG TABS Take 1 tablet by mouth 2 (two) times daily as needed. for pain  2  . saccharomyces boulardii (FLORASTOR) 250 MG capsule Take 1 capsule (250 mg total) by mouth 2 (two) times daily. (Patient not taking: Reported on 12/30/2016) 60 capsule 1  . venlafaxine (EFFEXOR) 25 MG tablet TAKE 5 TABLETS BY MOUTH 3 TIMES A DAY  2   No facility-administered medications prior to visit.     ROS See HPI  Objective:  BP 130/90 (BP Location: Left Arm, Patient Position: Sitting, Cuff Size: Normal)   Pulse (!) 104   Temp 98.5 F (36.9 C) (Oral)   Ht 5\' 5"  (1.651 m)   Wt 193 lb 1.3 oz (87.6 kg)   SpO2 96%   BMI 32.13 kg/m   BP Readings from Last 3 Encounters:  12/30/16 130/90  12/03/16 132/82  05/14/16 (!) 144/86    Wt Readings from Last 3 Encounters:  12/30/16 193 lb 1.3 oz (87.6 kg)  12/03/16 187 lb (84.8 kg)  05/14/16 183 lb (83 kg)    Physical Exam  Constitutional: She is oriented to person, place, and time. No distress.  Cardiovascular: Normal rate and normal heart sounds.   Pulmonary/Chest: Effort normal and breath sounds normal.  Abdominal: Soft. Bowel sounds are normal. She exhibits no distension. There is tenderness. There is no rebound and no guarding.  Musculoskeletal: She exhibits no edema.  Neurological: She is alert and oriented to person, place, and time.  Vitals reviewed.   Lab Results  Component Value Date   WBC 5.5 12/03/2016   HGB 13.8 12/03/2016   HCT 41.5 12/03/2016   PLT 282.0 12/03/2016   GLUCOSE 93 12/03/2016   CHOL 270 (H) 12/03/2016   TRIG 138.0 12/03/2016   HDL 67.80 12/03/2016   LDLCALC 175 (H) 12/03/2016   ALT 12 12/03/2016   AST 14 12/03/2016   NA 138 12/03/2016   K 4.1 12/03/2016   CL 103 12/03/2016   CREATININE 0.97 12/03/2016   BUN 14 12/03/2016   CO2 31 12/03/2016   TSH 1.26 12/03/2016   HGBA1C 5.9 09/10/2014    [place] Limited Joint Space Structures Up  Right  Result Date: 12/16/2016 CLINICAL DATA:  36 year old female with focal thickening in the right axilla discovered on self-examination. EXAM: 2D DIGITAL DIAGNOSTIC BILATERAL MAMMOGRAM WITH CAD AND ADJUNCT TOMO ULTRASOUND RIGHT AXILLA COMPARISON:  None ACR Breast Density Category c: The breast tissue is heterogeneously dense, which may obscure small masses. FINDINGS: 2D and 3D full field views of both breasts and a spot compression view of the right axilla demonstrate a circumscribed oval mass within the right axilla. No suspicious mass, distortion or worrisome calcifications are noted within the breasts bilaterally. Mammographic images were processed with CAD. On physical exam, prominence of the right axilla is noted with firm central palpable nodule. Targeted ultrasound is performed, showing a 2.4 x 1.3 x 1.8 cm circumscribed oval hypoechoic parallel mass in the right axilla. No enlarged lymph nodes are identified. IMPRESSION: 2.4 cm mass in the right axilla, likely representing a fibroadenoma within accessory breast tissue. The patient desires surgical excision due to pain and swelling. No mammographic evidence of breast malignancy bilaterally. RECOMMENDATION: Surgical consultation for the right axilla, which will be arranged. If the right axillary mass is not excised, six-month ultrasound follow-up is recommended. I have discussed the findings and recommendations with the patient. Results were also provided in writing at the conclusion of the visit. If applicable, a reminder letter will be sent to the patient regarding the next appointment. BI-RADS CATEGORY  3: Probably benign. Electronically Signed   By: Margarette Canada M.D.   On: 12/16/2016 15:17   Mm Diag Breast Tomo Bilateral  Result Date: 12/16/2016 CLINICAL DATA:  36 year old female with focal thickening in the right axilla discovered on self-examination. EXAM: 2D DIGITAL DIAGNOSTIC BILATERAL MAMMOGRAM WITH CAD AND ADJUNCT TOMO ULTRASOUND RIGHT AXILLA  COMPARISON:  None ACR Breast Density Category c: The breast tissue is heterogeneously dense, which may obscure small masses. FINDINGS: 2D and 3D full field views of both breasts and a spot compression view of the right axilla demonstrate a circumscribed oval mass within the right axilla. No suspicious mass, distortion or worrisome calcifications are noted within the breasts bilaterally. Mammographic images were processed with CAD. On physical exam, prominence of the right axilla is noted with firm central palpable nodule. Targeted ultrasound is performed, showing a 2.4 x 1.3 x 1.8 cm circumscribed oval hypoechoic parallel mass in the right axilla. No enlarged lymph nodes are identified. IMPRESSION: 2.4 cm mass in the right axilla, likely representing a fibroadenoma within accessory breast tissue. The patient desires surgical excision due to pain and swelling. No mammographic evidence of breast malignancy bilaterally. RECOMMENDATION: Surgical consultation for the right axilla, which will be arranged. If the right axillary mass is not excised, six-month ultrasound follow-up is recommended. I have discussed the findings and recommendations with the  patient. Results were also provided in writing at the conclusion of the visit. If applicable, a reminder letter will be sent to the patient regarding the next appointment. BI-RADS CATEGORY  3: Probably benign. Electronically Signed   By: Margarette Canada M.D.   On: 12/16/2016 15:17    Assessment & Plan:   Baillie was seen today for follow-up.  Diagnoses and all orders for this visit:  Drug-induced constipation -     naloxegol oxalate (MOVANTIK) 25 MG TABS tablet; Take 1 tablet (25 mg total) by mouth daily.  Gastroesophageal reflux disease, esophagitis presence not specified -     pantoprazole (PROTONIX) 40 MG tablet; Take 1 tablet (40 mg total) by mouth daily.  PCOS (polycystic ovarian syndrome) -     Prenatal Vit-Fe Fumarate-FA (PRENATAL VITAMIN PLUS LOW IRON)  27-1 MG TABS; Take 1 tablet by mouth daily with lunch.  Menorrhagia with irregular cycle -     Prenatal Vit-Fe Fumarate-FA (PRENATAL VITAMIN PLUS LOW IRON) 27-1 MG TABS; Take 1 tablet by mouth daily with lunch.   I have discontinued Ms. Sellen's linaclotide. I have also changed her metFORMIN. Additionally, I am having her start on naloxegol oxalate and PRENATAL VITAMIN PLUS LOW IRON. Lastly, I am having her maintain her amitriptyline, NUCYNTA, venlafaxine, DUEXIS, escitalopram, tiZANidine, saccharomyces boulardii, promethazine, and pantoprazole.  Meds ordered this encounter  Medications  . metFORMIN (GLUCOPHAGE) 500 MG tablet    Sig: Take 1 tablet (500 mg total) by mouth 2 (two) times daily with a meal.    Dispense:  30 tablet    Refill:  6    CYCLE FILL MEDICATION. Authorization is required for next refill.    Order Specific Question:   Supervising Provider    Answer:   Cassandria Anger [[id number]]  . naloxegol oxalate (MOVANTIK) 25 MG TABS tablet    Sig: Take 1 tablet (25 mg total) by mouth daily.    Dispense:  30 tablet    Refill:  3    Order Specific Question:   Supervising Provider    Answer:   Cassandria Anger [[id number]]  . Prenatal Vit-Fe Fumarate-FA (PRENATAL VITAMIN PLUS LOW IRON) 27-1 MG TABS    Sig: Take 1 tablet by mouth daily with lunch.    Dispense:  30 tablet    Order Specific Question:   Supervising Provider    Answer:   Cassandria Anger [[id number]]  . pantoprazole (PROTONIX) 40 MG tablet    Sig: Take 1 tablet (40 mg total) by mouth daily.    Dispense:  30 tablet    Refill:  3    Order Specific Question:   Supervising Provider    Answer:   Cassandria Anger [[id number]]    Follow-up: Return in about 6 months (around 07/02/2017) for constipation, gerd, .  Wilfred Lacy, NP

## 2016-12-31 ENCOUNTER — Telehealth: Payer: Self-pay | Admitting: Nurse Practitioner

## 2016-12-31 NOTE — Telephone Encounter (Signed)
PA for Movantix 25 mg started, waiting for response  Heather Bowman  Key: [id number]

## 2017-01-07 NOTE — Telephone Encounter (Signed)
PA is approve from [place].   Spoke with Rachel Bo from [facility], he said due to deductible that she hasnt met, this med will cost over $300.   Spoke with pt, she is aware, pt stated she wants to wait until after surgery to get this med.

## 2017-01-18 ENCOUNTER — Other Ambulatory Visit: Payer: Self-pay | Admitting: General Surgery

## 2017-01-27 ENCOUNTER — Other Ambulatory Visit: Payer: Self-pay | Admitting: Nurse Practitioner

## 2017-01-27 DIAGNOSIS — R110 Nausea: Secondary | ICD-10-CM

## 2017-02-10 ENCOUNTER — Telehealth: Payer: Self-pay | Admitting: *Deleted

## 2017-02-10 DIAGNOSIS — K5903 Drug induced constipation: Secondary | ICD-10-CM

## 2017-02-10 NOTE — Telephone Encounter (Signed)
Left smg on triage requesting refills on her prenatal vitamin, iron pills, and phenergan...Johny Chess

## 2017-02-11 NOTE — Telephone Encounter (Signed)
Pt contacted Pt stated that her insurance will pay for the prenatal vitamins. Pt is requesting another rx. Last rx for prenatal was written on 12/30/216.  Pt rq for linzess since the movantik is too expensive.  Pt wanted to see if insurance will pay for her iron pill too.

## 2017-02-11 NOTE — Telephone Encounter (Signed)
Does not need prescription for prenatal and iron pills. She can get those OTC.  Promethazine was last filled 01/27/2017. No refills at this time.

## 2017-02-14 MED ORDER — PRENATAL MULTI +DHA 27-0.8-200 MG PO CAPS: 1.0000 | ORAL_CAPSULE | Freq: Every day | ORAL | 1 refills | Status: DC

## 2017-02-14 MED ORDER — LINACLOTIDE 145 MCG PO CAPS: 145.0000 ug | ORAL_CAPSULE | Freq: Every day | ORAL | 3 refills | Status: DC

## 2017-02-14 NOTE — Telephone Encounter (Signed)
Left vm for pt to call back. Need to inform Heather Bowman's response.   rx sent in to prenatal vit and linzess but no iron pill due to no hx from previous record stating anemia.

## 2017-02-14 NOTE — Telephone Encounter (Signed)
Patient states that the last time she was in [place] told her that she wanted patient to take an iron supplement during her menstrual.  Patient would like to a script for this to see if insurance will cover.  Gave patient charlottes response on scripts.

## 2017-02-14 NOTE — Telephone Encounter (Signed)
Pt stated Baldo Ash told her to take iron pills when she is is on her menstrual, she was wondering if we can send in Rx instead or get OTC so insurance will pay for it. Please advise.

## 2017-02-15 NOTE — Telephone Encounter (Signed)
I think she misunderstood me. Due to excess blood loss during menstrual cycle, I told her to make sure she is maintain a healthy diet high in iron. OR during her menstrual cycle, take OTC iron supplement 325mg  oral once a day x 1week then stop.  Due to chronic constipation and not documented iron deficiency at this time, I do not want her to be on iron supplement continuously. Therefore she does not need prescription for iron supplement at this time.

## 2017-02-15 NOTE — Telephone Encounter (Signed)
Left vm for pt to call back, need to inform char[facility] below.   Iron pill is just a suggestion during heavy menstrual cycle (she doesn't have to take iron supple if she eat high iron rich food and prenatal vit has iron in it as well). No hx of anemia, so no need rx for iron supplement.

## 2017-02-17 NOTE — Telephone Encounter (Signed)
Pt aware.

## 2017-04-16 ENCOUNTER — Other Ambulatory Visit: Payer: Self-pay | Admitting: Nurse Practitioner

## 2017-04-16 DIAGNOSIS — R110 Nausea: Secondary | ICD-10-CM

## 2017-04-18 NOTE — Telephone Encounter (Signed)
Please advise 

## 2017-05-17 ENCOUNTER — Other Ambulatory Visit: Payer: Self-pay | Admitting: Nurse Practitioner

## 2017-05-17 DIAGNOSIS — K219 Gastro-esophageal reflux disease without esophagitis: Secondary | ICD-10-CM

## 2017-05-17 NOTE — Telephone Encounter (Signed)
rx sent in for 3 mo supply. Need office visit for future refills.

## 2017-05-18 ENCOUNTER — Other Ambulatory Visit: Payer: Self-pay | Admitting: Nurse Practitioner

## 2017-05-18 DIAGNOSIS — R110 Nausea: Secondary | ICD-10-CM

## 2017-05-27 ENCOUNTER — Other Ambulatory Visit: Payer: BLUE CROSS/BLUE SHIELD

## 2017-05-27 ENCOUNTER — Encounter: Payer: Self-pay | Admitting: Nurse Practitioner

## 2017-05-27 ENCOUNTER — Ambulatory Visit (INDEPENDENT_AMBULATORY_CARE_PROVIDER_SITE_OTHER): Payer: BLUE CROSS/BLUE SHIELD | Admitting: Nurse Practitioner

## 2017-05-27 VITALS — BP 120/72 | HR 96 | Temp 98.5°F | Ht 65.0 in | Wt 186.0 lb

## 2017-05-27 DIAGNOSIS — R300 Dysuria: Secondary | ICD-10-CM | POA: Diagnosis not present

## 2017-05-27 DIAGNOSIS — N3000 Acute cystitis without hematuria: Secondary | ICD-10-CM

## 2017-05-27 DIAGNOSIS — N760 Acute vaginitis: Secondary | ICD-10-CM

## 2017-05-27 LAB — WET PREP, GENITAL
CLUE CELLS WET PREP: NONE SEEN — AB
TRICH WET PREP: NONE SEEN — AB
YEAST WET PREP: NONE SEEN — AB

## 2017-05-27 LAB — POCT URINALYSIS DIPSTICK
BILIRUBIN UA: NEGATIVE
Blood, UA: NEGATIVE
Glucose, UA: NEGATIVE
KETONES UA: NEGATIVE
LEUKOCYTES UA: NEGATIVE
Nitrite, UA: POSITIVE
PH UA: 6 (ref 5.0–8.0)
Protein, UA: NEGATIVE
Spec Grav, UA: 1.01 (ref 1.010–1.025)
Urobilinogen, UA: 0.2 E.U./dL

## 2017-05-27 MED ORDER — METRONIDAZOLE 0.75 % VA GEL: 1.0000 | Freq: Two times a day (BID) | VAGINAL | 0 refills | Status: DC

## 2017-05-27 MED ORDER — FLUCONAZOLE 150 MG PO TABS: 150.0000 mg | ORAL_TABLET | Freq: Every day | ORAL | 0 refills | Status: DC

## 2017-05-27 NOTE — Patient Instructions (Addendum)
You will be contacted with urine and wet prep results.

## 2017-05-27 NOTE — Progress Notes (Signed)
Subjective:  Patient ID: Heather Bowman, female    DOB: [birth year]  Age: 36 y.o. MRN: [id number]  CC: Urinary Frequency (strong odor, has been taking AZO)   Urinary Tract Infection   This is a new problem. The current episode started in the past 7 days. The problem occurs every urination. The problem has been unchanged. The quality of the pain is described as burning. There has been no fever. She is sexually active. There is no history of pyelonephritis. Associated symptoms include a discharge and urgency. Pertinent negatives include no chills, frequency, hematuria, hesitancy, nausea or vomiting. She has tried nothing for the symptoms. There is no history of recurrent UTIs or urinary stasis.    Outpatient Medications Prior to Visit  Medication Sig Dispense Refill  . linaclotide (LINZESS) 145 MCG CAPS capsule Take 1 capsule (145 mcg total) by mouth daily before breakfast. 30 capsule 3  . metFORMIN (GLUCOPHAGE) 500 MG tablet Take 1 tablet (500 mg total) by mouth 2 (two) times daily with a meal. 30 tablet 6  . pantoprazole (PROTONIX) 40 MG tablet Take 1 tablet (40 mg total) by mouth daily. 30 tablet 2  . Prenatal MV-Min-Fe Fum-FA-DHA (PRENATAL MULTI +DHA) 27-0.8-200 MG CAPS Take 1 capsule by mouth daily. 90 capsule 1  . Prenatal Vit-Fe Fumarate-FA (PRENATAL VITAMIN PLUS LOW IRON) 27-1 MG TABS Take 1 tablet by mouth daily with lunch. 30 tablet   . promethazine (PHENERGAN) 12.5 MG tablet Take 1 tablet (12.5 mg total) by mouth every 8 (eight) hours as needed for nausea or vomiting. 30 tablet 0  . amitriptyline (ELAVIL) 25 MG tablet Take 50 mg by mouth at bedtime.   0  . DUEXIS 800-26.6 MG TABS Take 1 tablet by mouth 2 (two) times daily as needed. for pain  2  . escitalopram (LEXAPRO) 10 MG tablet Take 20 mg by mouth 1 day or 1 dose.    . NUCYNTA 75 MG TABS Take 75 mg by mouth 3 (three) times daily.   0  . saccharomyces boulardii (FLORASTOR) 250 MG capsule Take 1 capsule (250 mg total) by mouth 2  (two) times daily. (Patient not taking: Reported on 05/27/2017) 60 capsule 1  . tiZANidine (ZANAFLEX) 4 MG tablet Take 4 mg by mouth 3 (three) times daily as needed.  2  . venlafaxine (EFFEXOR) 25 MG tablet TAKE 5 TABLETS BY MOUTH 3 TIMES A DAY  2   No facility-administered medications prior to visit.     ROS See HPI  Objective:  BP 120/72 (BP Location: Left Arm, Patient Position: Sitting, Cuff Size: Large)   Pulse 96   Temp 98.5 F (36.9 C) (Oral)   Ht 5\' 5"  (1.651 m)   Wt 186 lb (84.4 kg)   SpO2 99%   BMI 30.95 kg/m   BP Readings from Last 3 Encounters:  05/27/17 120/72  12/30/16 130/90  12/03/16 132/82    Wt Readings from Last 3 Encounters:  05/27/17 186 lb (84.4 kg)  12/30/16 193 lb 1.3 oz (87.6 kg)  12/03/16 187 lb (84.8 kg)    Physical Exam  Constitutional: She is oriented to person, place, and time. No distress.  Cardiovascular: Normal rate.   Pulmonary/Chest: Effort normal.  Abdominal: Soft. Bowel sounds are normal. She exhibits no distension. There is no tenderness.  Genitourinary: There is no rash or tenderness on the right labia. There is no rash or tenderness on the left labia. There is erythema in the vagina. No tenderness in the vagina. Vaginal discharge  found.  Neurological: She is alert and oriented to person, place, and time.  Skin: Skin is warm and dry. No rash noted.  Vitals reviewed.   Lab Results  Component Value Date   WBC 5.5 12/03/2016   HGB 13.8 12/03/2016   HCT 41.5 12/03/2016   PLT 282.0 12/03/2016   GLUCOSE 93 12/03/2016   CHOL 270 (H) 12/03/2016   TRIG 138.0 12/03/2016   HDL 67.80 12/03/2016   LDLCALC 175 (H) 12/03/2016   ALT 12 12/03/2016   AST 14 12/03/2016   NA 138 12/03/2016   K 4.1 12/03/2016   CL 103 12/03/2016   CREATININE 0.97 12/03/2016   BUN 14 12/03/2016   CO2 31 12/03/2016   TSH 1.26 12/03/2016   HGBA1C 5.9 09/10/2014    [place] Limited Joint Space Structures Up Right  Result Date: 12/16/2016 CLINICAL DATA:   36 year old female with focal thickening in the right axilla discovered on self-examination. EXAM: 2D DIGITAL DIAGNOSTIC BILATERAL MAMMOGRAM WITH CAD AND ADJUNCT TOMO ULTRASOUND RIGHT AXILLA COMPARISON:  None ACR Breast Density Category c: The breast tissue is heterogeneously dense, which may obscure small masses. FINDINGS: 2D and 3D full field views of both breasts and a spot compression view of the right axilla demonstrate a circumscribed oval mass within the right axilla. No suspicious mass, distortion or worrisome calcifications are noted within the breasts bilaterally. Mammographic images were processed with CAD. On physical exam, prominence of the right axilla is noted with firm central palpable nodule. Targeted ultrasound is performed, showing a 2.4 x 1.3 x 1.8 cm circumscribed oval hypoechoic parallel mass in the right axilla. No enlarged lymph nodes are identified. IMPRESSION: 2.4 cm mass in the right axilla, likely representing a fibroadenoma within accessory breast tissue. The patient desires surgical excision due to pain and swelling. No mammographic evidence of breast malignancy bilaterally. RECOMMENDATION: Surgical consultation for the right axilla, which will be arranged. If the right axillary mass is not excised, six-month ultrasound follow-up is recommended. I have discussed the findings and recommendations with the patient. Results were also provided in writing at the conclusion of the visit. If applicable, a reminder letter will be sent to the patient regarding the next appointment. BI-RADS CATEGORY  3: Probably benign. Electronically Signed   By: Margarette Canada M.D.   On: 12/16/2016 15:17   Mm Diag Breast Tomo Bilateral  Result Date: 12/16/2016 CLINICAL DATA:  36 year old female with focal thickening in the right axilla discovered on self-examination. EXAM: 2D DIGITAL DIAGNOSTIC BILATERAL MAMMOGRAM WITH CAD AND ADJUNCT TOMO ULTRASOUND RIGHT AXILLA COMPARISON:  None ACR Breast Density Category c:  The breast tissue is heterogeneously dense, which may obscure small masses. FINDINGS: 2D and 3D full field views of both breasts and a spot compression view of the right axilla demonstrate a circumscribed oval mass within the right axilla. No suspicious mass, distortion or worrisome calcifications are noted within the breasts bilaterally. Mammographic images were processed with CAD. On physical exam, prominence of the right axilla is noted with firm central palpable nodule. Targeted ultrasound is performed, showing a 2.4 x 1.3 x 1.8 cm circumscribed oval hypoechoic parallel mass in the right axilla. No enlarged lymph nodes are identified. IMPRESSION: 2.4 cm mass in the right axilla, likely representing a fibroadenoma within accessory breast tissue. The patient desires surgical excision due to pain and swelling. No mammographic evidence of breast malignancy bilaterally. RECOMMENDATION: Surgical consultation for the right axilla, which will be arranged. If the right axillary mass is not excised, six-month ultrasound  follow-up is recommended. I have discussed the findings and recommendations with the patient. Results were also provided in writing at the conclusion of the visit. If applicable, a reminder letter will be sent to the patient regarding the next appointment. BI-RADS CATEGORY  3: Probably benign. Electronically Signed   By: Margarette Canada M.D.   On: 12/16/2016 15:17    Assessment & Plan:   Jabrea was seen today for urinary frequency.  Diagnoses and all orders for this visit:  Vaginosis -     Wet prep, genital; Future -     fluconazole (DIFLUCAN) 150 MG tablet; Take 1 tablet (150 mg total) by mouth daily. -     metroNIDAZOLE (METROGEL VAGINAL) 0.75 % vaginal gel; Place 1 Applicatorful vaginally 2 (two) times daily.  Dysuria -     POCT urinalysis dipstick -     Urine Culture; Future   I am having Ms. Westfall start on fluconazole and metroNIDAZOLE. I am also having her maintain her amitriptyline,  NUCYNTA, venlafaxine, DUEXIS, escitalopram, tiZANidine, saccharomyces boulardii, metFORMIN, PRENATAL VITAMIN PLUS LOW IRON, linaclotide, PRENATAL MULTI +DHA, pantoprazole, and promethazine.  Meds ordered this encounter  Medications  . fluconazole (DIFLUCAN) 150 MG tablet    Sig: Take 1 tablet (150 mg total) by mouth daily.    Dispense:  1 tablet    Refill:  0    Order Specific Question:   Supervising Provider    Answer:   Cassandria Anger [[id number]]  . metroNIDAZOLE (METROGEL VAGINAL) 0.75 % vaginal gel    Sig: Place 1 Applicatorful vaginally 2 (two) times daily.    Dispense:  70 g    Refill:  0    Order Specific Question:   Supervising Provider    Answer:   Cassandria Anger [[id number]]    Follow-up: Return if symptoms worsen or fail to improve.  Wilfred Lacy, NP

## 2017-05-30 ENCOUNTER — Telehealth: Payer: Self-pay | Admitting: Nurse Practitioner

## 2017-05-30 DIAGNOSIS — N760 Acute vaginitis: Secondary | ICD-10-CM

## 2017-05-30 DIAGNOSIS — N3000 Acute cystitis without hematuria: Secondary | ICD-10-CM

## 2017-05-30 LAB — URINE CULTURE

## 2017-05-30 MED ORDER — METRONIDAZOLE 0.75 % VA GEL: 1.0000 | Freq: Two times a day (BID) | VAGINAL | 0 refills | Status: DC

## 2017-05-30 MED ORDER — NITROFURANTOIN MONOHYD MACRO 100 MG PO CAPS: 100.0000 mg | ORAL_CAPSULE | Freq: Two times a day (BID) | ORAL | 0 refills | Status: DC

## 2017-05-30 MED ORDER — NITROFURANTOIN MONOHYD MACRO 100 MG PO CAPS: 100.0000 mg | ORAL_CAPSULE | Freq: Two times a day (BID) | ORAL | 0 refills | Status: AC

## 2017-05-30 MED ORDER — FLUCONAZOLE 150 MG PO TABS: 150.0000 mg | ORAL_TABLET | Freq: Every day | ORAL | 0 refills | Status: DC

## 2017-05-30 NOTE — Telephone Encounter (Signed)
Gave pt test result. Pt request 3 rx sent to [facility] instead of [facility] ([facility] already).

## 2017-07-05 ENCOUNTER — Other Ambulatory Visit: Payer: Self-pay | Admitting: Nurse Practitioner

## 2017-07-05 ENCOUNTER — Encounter: Payer: Self-pay | Admitting: Nurse Practitioner

## 2017-07-05 ENCOUNTER — Ambulatory Visit (INDEPENDENT_AMBULATORY_CARE_PROVIDER_SITE_OTHER): Payer: BLUE CROSS/BLUE SHIELD | Admitting: Nurse Practitioner

## 2017-07-05 VITALS — BP 120/74 | HR 91 | Temp 98.1°F | Ht 65.0 in | Wt 181.0 lb

## 2017-07-05 DIAGNOSIS — K5903 Drug induced constipation: Secondary | ICD-10-CM

## 2017-07-05 DIAGNOSIS — R112 Nausea with vomiting, unspecified: Secondary | ICD-10-CM | POA: Diagnosis not present

## 2017-07-05 DIAGNOSIS — Z86010 Personal history of colonic polyps: Secondary | ICD-10-CM | POA: Diagnosis not present

## 2017-07-05 DIAGNOSIS — K582 Mixed irritable bowel syndrome: Secondary | ICD-10-CM

## 2017-07-05 DIAGNOSIS — K219 Gastro-esophageal reflux disease without esophagitis: Secondary | ICD-10-CM

## 2017-07-05 MED ORDER — PROMETHAZINE HCL 25 MG PO TABS: 12.5000 mg | ORAL_TABLET | Freq: Three times a day (TID) | ORAL | 1 refills | Status: DC | PRN

## 2017-07-05 MED ORDER — PANTOPRAZOLE SODIUM 40 MG PO TBEC: 40.0000 mg | DELAYED_RELEASE_TABLET | Freq: Every day | ORAL | 1 refills | Status: DC

## 2017-07-05 MED ORDER — LINACLOTIDE 145 MCG PO CAPS: 145.0000 ug | ORAL_CAPSULE | Freq: Every day | ORAL | 1 refills | Status: DC

## 2017-07-05 MED ORDER — HYOSCYAMINE SULFATE 0.125 MG PO TABS: 0.1250 mg | ORAL_TABLET | Freq: Four times a day (QID) | ORAL | 0 refills | Status: DC | PRN

## 2017-07-05 NOTE — Patient Instructions (Addendum)
Please have PAP smear report faxed to [place].  You will be contacted to schedule appt with GI.  Maintain low FODMAP diet (low gluten, low carb, low sugar, less gassy foods (avoid garlic, onions, legumes, peppers).  Diet for Irritable Bowel Syndrome When you have irritable bowel syndrome (IBS), the foods you eat and your eating habits are very important. IBS may cause various symptoms, such as abdominal pain, constipation, or diarrhea. Choosing the right foods can help ease discomfort caused by these symptoms. Work with your health care provider and dietitian to find the best eating plan to help control your symptoms. What general guidelines do I need to follow?  Keep a food diary. This will help you identify foods that cause symptoms. Write down: ? What you eat and when. ? What symptoms you have. ? When symptoms occur in relation to your meals.  Avoid foods that cause symptoms. Talk with your dietitian about other ways to get the same nutrients that are in these foods.  Eat more foods that contain fiber. Take a fiber supplement if directed by your dietitian.  Eat your meals slowly, in a relaxed setting.  Aim to eat 5-6 small meals per day. Do not skip meals.  Drink enough fluids to keep your urine clear or pale yellow.  Ask your health care provider if you should take an over-the-counter probiotic during flare-ups to help restore healthy gut bacteria.  If you have cramping or diarrhea, try making your meals low in fat and high in carbohydrates. Examples of carbohydrates are pasta, rice, whole grain breads and cereals, fruits, and vegetables.  If dairy products cause your symptoms to flare up, try eating less of them. You might be able to handle yogurt better than other dairy products because it contains bacteria that help with digestion. What foods are not recommended? The following are some foods and drinks that may worsen your symptoms:  Fatty foods, such as [place] fries.  Milk  products, such as cheese or ice cream.  Chocolate.  Alcohol.  Products with caffeine, such as coffee.  Carbonated drinks, such as soda.  The items listed above may not be a complete list of foods and beverages to avoid. Contact your dietitian for more information. What foods are good sources of fiber? Your health care provider or dietitian may recommend that you eat more foods that contain fiber. Fiber can help reduce constipation and other IBS symptoms. Add foods with fiber to your diet a little at a time so that your body can get used to them. Too much fiber at once might cause gas and swelling of your abdomen. The following are some foods that are good sources of fiber:  Apples.  Peaches.  Pears.  Berries.  Figs.  Broccoli (raw).  Cabbage.  Carrots.  Raw peas.  Kidney beans.  Lima beans.  Whole grain bread.  Whole grain cereal.  Where to find more information: [employer] for Functional Gastrointestinal Disorders: www.iffgd.[employer] of Diabetes and Digestive and Kidney Diseases: NetworkAffair.co.za.aspx This information is not intended to replace advice given to you by your health care provider. Make sure you discuss any questions you have with your health care provider. Document Released: 01/08/2004 Document Revised: 03/25/2016 Document Reviewed: 01/18/2014 Elsevier Interactive Patient Education  2018 [employer].

## 2017-07-05 NOTE — Progress Notes (Signed)
Subjective:  Patient ID: Heather Bowman, female    DOB: Oct 29, 1981  Age: 36 y.o. MRN: [id number]  CC: Follow-up (6 mo fu---headache on right side of her face toward ear. going on for 1 wk/ phenagen consult?linzess refills?)   HPI  Nausea and ABD pain: Chronic, onset 2005 Persistent intermittent nausea and ABD pain. No specific trigger.  Improves with promethazine use. Last Evaluated by GI 04/2013 and digestive health clinic EGD and colonoscopy done (colon polyps removed, 2009), diagnosed with IBS-mixed, GERD, colon polyps.  Treatment recommended: bentyl 10mg , low FODMAP diet, and metamucil. Also recommended to return to GI, but appt was never made. Needs promethazine dose increased and refill. Improved constipation with linzess.  Outpatient Medications Prior to Visit  Medication Sig Dispense Refill  . amitriptyline (ELAVIL) 25 MG tablet Take 50 mg by mouth at bedtime.   0  . metFORMIN (GLUCOPHAGE) 500 MG tablet Take 1 tablet (500 mg total) by mouth 2 (two) times daily with a meal. 30 tablet 6  . Prenatal MV-Min-Fe Fum-FA-DHA (PRENATAL MULTI +DHA) 27-0.8-200 MG CAPS Take 1 capsule by mouth daily. 90 capsule 1  . Prenatal Vit-Fe Fumarate-FA (PRENATAL VITAMIN PLUS LOW IRON) 27-1 MG TABS Take 1 tablet by mouth daily with lunch. 30 tablet   . saccharomyces boulardii (FLORASTOR) 250 MG capsule Take 1 capsule (250 mg total) by mouth 2 (two) times daily. 60 capsule 1  . tiZANidine (ZANAFLEX) 4 MG tablet Take 4 mg by mouth 3 (three) times daily as needed.  2  . DUEXIS 800-26.6 MG TABS Take 1 tablet by mouth 2 (two) times daily as needed. for pain  2  . fluconazole (DIFLUCAN) 150 MG tablet Take 1 tablet (150 mg total) by mouth daily. 1 tablet 0  . linaclotide (LINZESS) 145 MCG CAPS capsule Take 1 capsule (145 mcg total) by mouth daily before breakfast. 30 capsule 3  . metroNIDAZOLE (METROGEL VAGINAL) 0.75 % vaginal gel Place 1 Applicatorful vaginally 2 (two) times daily. 70 g 0  . pantoprazole  (PROTONIX) 40 MG tablet Take 1 tablet (40 mg total) by mouth daily. 30 tablet 2  . promethazine (PHENERGAN) 12.5 MG tablet Take 1 tablet (12.5 mg total) by mouth every 8 (eight) hours as needed for nausea or vomiting. 30 tablet 0  . venlafaxine (EFFEXOR) 25 MG tablet TAKE 5 TABLETS BY MOUTH 3 TIMES A DAY  2  . escitalopram (LEXAPRO) 10 MG tablet Take 20 mg by mouth 1 day or 1 dose.    . NUCYNTA 75 MG TABS Take 75 mg by mouth 3 (three) times daily.   0   No facility-administered medications prior to visit.     ROS See HPI  Objective:  BP 120/74   Pulse 91   Temp 98.1 F (36.7 C)   Ht 5\' 5"  (1.651 m)   Wt 181 lb (82.1 kg)   SpO2 98%   BMI 30.12 kg/m   BP Readings from Last 3 Encounters:  07/05/17 120/74  05/27/17 120/72  12/30/16 130/90    Wt Readings from Last 3 Encounters:  07/05/17 181 lb (82.1 kg)  05/27/17 186 lb (84.4 kg)  12/30/16 193 lb 1.3 oz (87.6 kg)    Physical Exam  Constitutional: She is oriented to person, place, and time. No distress.  Cardiovascular: Normal rate.   Pulmonary/Chest: Effort normal.  Abdominal: Soft. Bowel sounds are normal. She exhibits no distension. There is no tenderness.  Neurological: She is alert and oriented to person, place, and time.  Vitals  reviewed.   Lab Results  Component Value Date   WBC 5.5 12/03/2016   HGB 13.8 12/03/2016   HCT 41.5 12/03/2016   PLT 282.0 12/03/2016   GLUCOSE 93 12/03/2016   CHOL 270 (H) 12/03/2016   TRIG 138.0 12/03/2016   HDL 67.80 12/03/2016   LDLCALC 175 (H) 12/03/2016   ALT 12 12/03/2016   AST 14 12/03/2016   NA 138 12/03/2016   K 4.1 12/03/2016   CL 103 12/03/2016   CREATININE 0.97 12/03/2016   BUN 14 12/03/2016   CO2 31 12/03/2016   TSH 1.26 12/03/2016   HGBA1C 5.9 09/10/2014    [place] Limited Joint Space Structures Up Right  Result Date: 12/16/2016 CLINICAL DATA:  36 year old female with focal thickening in the right axilla discovered on self-examination. EXAM: 2D DIGITAL DIAGNOSTIC  BILATERAL MAMMOGRAM WITH CAD AND ADJUNCT TOMO ULTRASOUND RIGHT AXILLA COMPARISON:  None ACR Breast Density Category c: The breast tissue is heterogeneously dense, which may obscure small masses. FINDINGS: 2D and 3D full field views of both breasts and a spot compression view of the right axilla demonstrate a circumscribed oval mass within the right axilla. No suspicious mass, distortion or worrisome calcifications are noted within the breasts bilaterally. Mammographic images were processed with CAD. On physical exam, prominence of the right axilla is noted with firm central palpable nodule. Targeted ultrasound is performed, showing a 2.4 x 1.3 x 1.8 cm circumscribed oval hypoechoic parallel mass in the right axilla. No enlarged lymph nodes are identified. IMPRESSION: 2.4 cm mass in the right axilla, likely representing a fibroadenoma within accessory breast tissue. The patient desires surgical excision due to pain and swelling. No mammographic evidence of breast malignancy bilaterally. RECOMMENDATION: Surgical consultation for the right axilla, which will be arranged. If the right axillary mass is not excised, six-month ultrasound follow-up is recommended. I have discussed the findings and recommendations with the patient. Results were also provided in writing at the conclusion of the visit. If applicable, a reminder letter will be sent to the patient regarding the next appointment. BI-RADS CATEGORY  3: Probably benign. Electronically Signed   By: Margarette Canada M.D.   On: 12/16/2016 15:17   Mm Diag Breast Tomo Bilateral  Result Date: 12/16/2016 CLINICAL DATA:  36 year old female with focal thickening in the right axilla discovered on self-examination. EXAM: 2D DIGITAL DIAGNOSTIC BILATERAL MAMMOGRAM WITH CAD AND ADJUNCT TOMO ULTRASOUND RIGHT AXILLA COMPARISON:  None ACR Breast Density Category c: The breast tissue is heterogeneously dense, which may obscure small masses. FINDINGS: 2D and 3D full field views of both  breasts and a spot compression view of the right axilla demonstrate a circumscribed oval mass within the right axilla. No suspicious mass, distortion or worrisome calcifications are noted within the breasts bilaterally. Mammographic images were processed with CAD. On physical exam, prominence of the right axilla is noted with firm central palpable nodule. Targeted ultrasound is performed, showing a 2.4 x 1.3 x 1.8 cm circumscribed oval hypoechoic parallel mass in the right axilla. No enlarged lymph nodes are identified. IMPRESSION: 2.4 cm mass in the right axilla, likely representing a fibroadenoma within accessory breast tissue. The patient desires surgical excision due to pain and swelling. No mammographic evidence of breast malignancy bilaterally. RECOMMENDATION: Surgical consultation for the right axilla, which will be arranged. If the right axillary mass is not excised, six-month ultrasound follow-up is recommended. I have discussed the findings and recommendations with the patient. Results were also provided in writing at the conclusion of the visit.  If applicable, a reminder letter will be sent to the patient regarding the next appointment. BI-RADS CATEGORY  3: Probably benign. Electronically Signed   By: Margarette Canada M.D.   On: 12/16/2016 15:17    Assessment & Plan:   Allisson was seen today for follow-up.  Diagnoses and all orders for this visit:  Nausea and vomiting, intractability of vomiting not specified, unspecified vomiting type -     Ambulatory referral to Gastroenterology -     hyoscyamine (LEVSIN, ANASPAZ) 0.125 MG tablet; Take 1 tablet (0.125 mg total) by mouth every 6 (six) hours as needed for cramping. -     promethazine (PHENERGAN) 25 MG tablet; Take 0.5 tablets (12.5 mg total) by mouth every 8 (eight) hours as needed for nausea or vomiting.  Gastroesophageal reflux disease, esophagitis presence not specified -     Ambulatory referral to Gastroenterology -     hyoscyamine (LEVSIN,  ANASPAZ) 0.125 MG tablet; Take 1 tablet (0.125 mg total) by mouth every 6 (six) hours as needed for cramping. -     pantoprazole (PROTONIX) 40 MG tablet; Take 1 tablet (40 mg total) by mouth daily.  Drug-induced constipation -     linaclotide (LINZESS) 145 MCG CAPS capsule; Take 1 capsule (145 mcg total) by mouth daily before breakfast.  Irritable bowel syndrome with both constipation and diarrhea -     Ambulatory referral to Gastroenterology -     hyoscyamine (LEVSIN, ANASPAZ) 0.125 MG tablet; Take 1 tablet (0.125 mg total) by mouth every 6 (six) hours as needed for cramping. -     pantoprazole (PROTONIX) 40 MG tablet; Take 1 tablet (40 mg total) by mouth daily. -     promethazine (PHENERGAN) 25 MG tablet; Take 0.5 tablets (12.5 mg total) by mouth every 8 (eight) hours as needed for nausea or vomiting. -     linaclotide (LINZESS) 145 MCG CAPS capsule; Take 1 capsule (145 mcg total) by mouth daily before breakfast.  Hx of colonic polyp -     Ambulatory referral to Gastroenterology   I have discontinued Ms. Shurtz's NUCYNTA, DUEXIS, escitalopram, fluconazole, and metroNIDAZOLE. I have also changed her promethazine. Additionally, I am having her start on hyoscyamine. Lastly, I am having her maintain her amitriptyline, venlafaxine, tiZANidine, saccharomyces boulardii, metFORMIN, PRENATAL VITAMIN PLUS LOW IRON, PRENATAL MULTI +DHA, Oxycodone-Acetaminophen (PERCOCET PO), lurasidone, pantoprazole, and linaclotide.  Meds ordered this encounter  Medications  . Oxycodone-Acetaminophen (PERCOCET PO)    Sig: Take by mouth.  . hyoscyamine (LEVSIN, ANASPAZ) 0.125 MG tablet    Sig: Take 1 tablet (0.125 mg total) by mouth every 6 (six) hours as needed for cramping.    Dispense:  30 tablet    Refill:  0    Order Specific Question:   Supervising Provider    Answer:   Cassandria Anger [[id number]]  . lurasidone (LATUDA) 40 MG TABS tablet    Sig: Take 1 tablet (40 mg total) by mouth daily with breakfast.     Dispense:  30 tablet    Order Specific Question:   Supervising Provider    Answer:   Cassandria Anger [[id number]]  . pantoprazole (PROTONIX) 40 MG tablet    Sig: Take 1 tablet (40 mg total) by mouth daily.    Dispense:  90 tablet    Refill:  1    This prescription was filled on 05/17/2017. Any refills authorized will be placed on file.    Order Specific Question:   Supervising Provider    Answer:  PLOTNIKOV, ALEKSEI V [[id number]]  . promethazine (PHENERGAN) 25 MG tablet    Sig: Take 0.5 tablets (12.5 mg total) by mouth every 8 (eight) hours as needed for nausea or vomiting.    Dispense:  90 tablet    Refill:  1    Order Specific Question:   Supervising Provider    Answer:   Cassandria Anger [[id number]]  . linaclotide (LINZESS) 145 MCG CAPS capsule    Sig: Take 1 capsule (145 mcg total) by mouth daily before breakfast.    Dispense:  90 capsule    Refill:  1    Order Specific Question:   Supervising Provider    Answer:   Cassandria Anger [[id number]]    Follow-up: Return in about 6 months (around 01/02/2018) for CPE (fasting) at [facility].  Wilfred Lacy, NP

## 2017-07-14 ENCOUNTER — Encounter: Payer: Self-pay | Admitting: Internal Medicine

## 2017-07-19 ENCOUNTER — Ambulatory Visit (INDEPENDENT_AMBULATORY_CARE_PROVIDER_SITE_OTHER): Payer: BLUE CROSS/BLUE SHIELD | Admitting: Nurse Practitioner

## 2017-07-19 ENCOUNTER — Encounter: Payer: Self-pay | Admitting: Nurse Practitioner

## 2017-07-19 ENCOUNTER — Ambulatory Visit: Payer: BLUE CROSS/BLUE SHIELD | Admitting: Nurse Practitioner

## 2017-07-19 VITALS — BP 118/78 | HR 97 | Temp 98.4°F | Ht 65.0 in | Wt 179.0 lb

## 2017-07-19 DIAGNOSIS — R112 Nausea with vomiting, unspecified: Secondary | ICD-10-CM | POA: Diagnosis not present

## 2017-07-19 DIAGNOSIS — R7303 Prediabetes: Secondary | ICD-10-CM | POA: Diagnosis not present

## 2017-07-19 DIAGNOSIS — K219 Gastro-esophageal reflux disease without esophagitis: Secondary | ICD-10-CM

## 2017-07-19 DIAGNOSIS — K582 Mixed irritable bowel syndrome: Secondary | ICD-10-CM

## 2017-07-19 LAB — POCT GLYCOSYLATED HEMOGLOBIN (HGB A1C): Hemoglobin A1C: 5.7

## 2017-07-19 MED ORDER — METFORMIN HCL ER 500 MG PO TB24: 500.0000 mg | ORAL_TABLET | Freq: Every day | ORAL | 3 refills | Status: DC

## 2017-07-19 MED ORDER — HYOSCYAMINE SULFATE 0.125 MG PO TABS: 0.1250 mg | ORAL_TABLET | Freq: Four times a day (QID) | ORAL | 0 refills | Status: DC | PRN

## 2017-07-19 MED ORDER — HYOSCYAMINE SULFATE 0.125 MG PO TABS
0.1250 mg | ORAL_TABLET | Freq: Four times a day (QID) | ORAL | 0 refills | Status: DC | PRN
Start: 2017-07-19 — End: 2017-07-19

## 2017-07-19 NOTE — Progress Notes (Signed)
Subjective:  Patient ID: Heather Bowman, female    DOB: 17-Jun-1981  Age: 36 y.o. MRN: [id number]  CC: GI Problem (nausea,vomitting and diarrhea for 4 days. )   HPI Diarrhea: loose stool (3x/day), no blood in stool, no mucus, some undigested food. Has not stopped linzess.  Persistent nausea and vomiting: Did not use hyoscyamine as prescribed. Has appt with [facility] 09/2017.  Outpatient Medications Prior to Visit  Medication Sig Dispense Refill  . amitriptyline (ELAVIL) 25 MG tablet Take 50 mg by mouth at bedtime.   0  . linaclotide (LINZESS) 145 MCG CAPS capsule Take 1 capsule (145 mcg total) by mouth daily before breakfast. 90 capsule 1  . lurasidone (LATUDA) 40 MG TABS tablet Take 1 tablet (40 mg total) by mouth daily with breakfast. 30 tablet   . Oxycodone-Acetaminophen (PERCOCET PO) Take by mouth.    . pantoprazole (PROTONIX) 40 MG tablet Take 1 tablet (40 mg total) by mouth daily. 90 tablet 1  . Prenatal MV-Min-Fe Fum-FA-DHA (PRENATAL MULTI +DHA) 27-0.8-200 MG CAPS Take 1 capsule by mouth daily. 90 capsule 1  . Prenatal Vit-Fe Fumarate-FA (PRENATAL VITAMIN PLUS LOW IRON) 27-1 MG TABS Take 1 tablet by mouth daily with lunch. 30 tablet   . promethazine (PHENERGAN) 25 MG tablet Take 0.5 tablets (12.5 mg total) by mouth every 8 (eight) hours as needed for nausea or vomiting. 90 tablet 1  . saccharomyces boulardii (FLORASTOR) 250 MG capsule Take 1 capsule (250 mg total) by mouth 2 (two) times daily. 60 capsule 1  . tiZANidine (ZANAFLEX) 4 MG tablet Take 4 mg by mouth 3 (three) times daily as needed.  2  . metFORMIN (GLUCOPHAGE) 500 MG tablet Take 1 tablet (500 mg total) by mouth 2 (two) times daily with a meal. 30 tablet 6  . venlafaxine (EFFEXOR) 25 MG tablet TAKE 5 TABLETS BY MOUTH 3 TIMES A DAY  2  . hyoscyamine (LEVSIN, ANASPAZ) 0.125 MG tablet Take 1 tablet (0.125 mg total) by mouth every 6 (six) hours as needed for cramping. (Patient not taking: Reported on 07/19/2017) 30 tablet 0     No facility-administered medications prior to visit.     ROS See HPI  Objective:  BP 118/78   Pulse 97   Temp 98.4 F (36.9 C)   Ht 5\' 5"  (1.651 m)   Wt 179 lb (81.2 kg)   SpO2 100%   BMI 29.79 kg/m   BP Readings from Last 3 Encounters:  07/19/17 118/78  07/05/17 120/74  05/27/17 120/72    Wt Readings from Last 3 Encounters:  07/19/17 179 lb (81.2 kg)  07/05/17 181 lb (82.1 kg)  05/27/17 186 lb (84.4 kg)    Physical Exam  Constitutional: She is oriented to person, place, and time.  Cardiovascular: Normal rate.   Pulmonary/Chest: Effort normal.  Abdominal: Soft. Bowel sounds are normal. She exhibits no distension. There is tenderness. There is no rebound and no guarding.  Neurological: She is alert and oriented to person, place, and time.  Vitals reviewed.   Lab Results  Component Value Date   WBC 5.5 12/03/2016   HGB 13.8 12/03/2016   HCT 41.5 12/03/2016   PLT 282.0 12/03/2016   GLUCOSE 93 12/03/2016   CHOL 270 (H) 12/03/2016   TRIG 138.0 12/03/2016   HDL 67.80 12/03/2016   LDLCALC 175 (H) 12/03/2016   ALT 12 12/03/2016   AST 14 12/03/2016   NA 138 12/03/2016   K 4.1 12/03/2016   CL 103 12/03/2016  CREATININE 0.97 12/03/2016   BUN 14 12/03/2016   CO2 31 12/03/2016   TSH 1.26 12/03/2016   HGBA1C 5.7% 07/19/2017    [place] Limited Joint Space Structures Up Right  Result Date: 12/16/2016 CLINICAL DATA:  36 year old female with focal thickening in the right axilla discovered on self-examination. EXAM: 2D DIGITAL DIAGNOSTIC BILATERAL MAMMOGRAM WITH CAD AND ADJUNCT TOMO ULTRASOUND RIGHT AXILLA COMPARISON:  None ACR Breast Density Category c: The breast tissue is heterogeneously dense, which may obscure small masses. FINDINGS: 2D and 3D full field views of both breasts and a spot compression view of the right axilla demonstrate a circumscribed oval mass within the right axilla. No suspicious mass, distortion or worrisome calcifications are noted within the  breasts bilaterally. Mammographic images were processed with CAD. On physical exam, prominence of the right axilla is noted with firm central palpable nodule. Targeted ultrasound is performed, showing a 2.4 x 1.3 x 1.8 cm circumscribed oval hypoechoic parallel mass in the right axilla. No enlarged lymph nodes are identified. IMPRESSION: 2.4 cm mass in the right axilla, likely representing a fibroadenoma within accessory breast tissue. The patient desires surgical excision due to pain and swelling. No mammographic evidence of breast malignancy bilaterally. RECOMMENDATION: Surgical consultation for the right axilla, which will be arranged. If the right axillary mass is not excised, six-month ultrasound follow-up is recommended. I have discussed the findings and recommendations with the patient. Results were also provided in writing at the conclusion of the visit. If applicable, a reminder letter will be sent to the patient regarding the next appointment. BI-RADS CATEGORY  3: Probably benign. Electronically Signed   By: Margarette Canada M.D.   On: 12/16/2016 15:17   Mm Diag Breast Tomo Bilateral  Result Date: 12/16/2016 CLINICAL DATA:  36 year old female with focal thickening in the right axilla discovered on self-examination. EXAM: 2D DIGITAL DIAGNOSTIC BILATERAL MAMMOGRAM WITH CAD AND ADJUNCT TOMO ULTRASOUND RIGHT AXILLA COMPARISON:  None ACR Breast Density Category c: The breast tissue is heterogeneously dense, which may obscure small masses. FINDINGS: 2D and 3D full field views of both breasts and a spot compression view of the right axilla demonstrate a circumscribed oval mass within the right axilla. No suspicious mass, distortion or worrisome calcifications are noted within the breasts bilaterally. Mammographic images were processed with CAD. On physical exam, prominence of the right axilla is noted with firm central palpable nodule. Targeted ultrasound is performed, showing a 2.4 x 1.3 x 1.8 cm circumscribed  oval hypoechoic parallel mass in the right axilla. No enlarged lymph nodes are identified. IMPRESSION: 2.4 cm mass in the right axilla, likely representing a fibroadenoma within accessory breast tissue. The patient desires surgical excision due to pain and swelling. No mammographic evidence of breast malignancy bilaterally. RECOMMENDATION: Surgical consultation for the right axilla, which will be arranged. If the right axillary mass is not excised, six-month ultrasound follow-up is recommended. I have discussed the findings and recommendations with the patient. Results were also provided in writing at the conclusion of the visit. If applicable, a reminder letter will be sent to the patient regarding the next appointment. BI-RADS CATEGORY  3: Probably benign. Electronically Signed   By: Margarette Canada M.D.   On: 12/16/2016 15:17    Assessment & Plan:   Paytyn was seen today for gi problem.  Diagnoses and all orders for this visit:  Gastroesophageal reflux disease, esophagitis presence not specified -     Discontinue: hyoscyamine (LEVSIN, ANASPAZ) 0.125 MG tablet; Take 1 tablet (0.125 mg  total) by mouth every 6 (six) hours as needed for cramping. -     hyoscyamine (LEVSIN, ANASPAZ) 0.125 MG tablet; Take 1 tablet (0.125 mg total) by mouth every 6 (six) hours as needed for cramping.  Nausea and vomiting, intractability of vomiting not specified, unspecified vomiting type -     Discontinue: hyoscyamine (LEVSIN, ANASPAZ) 0.125 MG tablet; Take 1 tablet (0.125 mg total) by mouth every 6 (six) hours as needed for cramping. -     hyoscyamine (LEVSIN, ANASPAZ) 0.125 MG tablet; Take 1 tablet (0.125 mg total) by mouth every 6 (six) hours as needed for cramping.  Prediabetes -     POCT glycosylated hemoglobin (Hb A1C) -     metFORMIN (GLUCOPHAGE-XR) 500 MG 24 hr tablet; Take 1 tablet (500 mg total) by mouth daily with breakfast.  Irritable bowel syndrome with both constipation and diarrhea -     Discontinue:  hyoscyamine (LEVSIN, ANASPAZ) 0.125 MG tablet; Take 1 tablet (0.125 mg total) by mouth every 6 (six) hours as needed for cramping. -     hyoscyamine (LEVSIN, ANASPAZ) 0.125 MG tablet; Take 1 tablet (0.125 mg total) by mouth every 6 (six) hours as needed for cramping.   I have discontinued Ms. July's metFORMIN. I am also having her start on metFORMIN. Additionally, I am having her maintain her amitriptyline, venlafaxine, tiZANidine, saccharomyces boulardii, PRENATAL VITAMIN PLUS LOW IRON, PRENATAL MULTI +DHA, Oxycodone-Acetaminophen (PERCOCET PO), lurasidone, pantoprazole, promethazine, linaclotide, and hyoscyamine.  Meds ordered this encounter  Medications  . DISCONTD: hyoscyamine (LEVSIN, ANASPAZ) 0.125 MG tablet    Sig: Take 1 tablet (0.125 mg total) by mouth every 6 (six) hours as needed for cramping.    Dispense:  90 tablet    Refill:  0    Order Specific Question:   Supervising Provider    Answer:   Cassandria Anger [[id number]]  . metFORMIN (GLUCOPHAGE-XR) 500 MG 24 hr tablet    Sig: Take 1 tablet (500 mg total) by mouth daily with breakfast.    Dispense:  90 tablet    Refill:  3    Order Specific Question:   Supervising Provider    Answer:   Cassandria Anger [[id number]]  . hyoscyamine (LEVSIN, ANASPAZ) 0.125 MG tablet    Sig: Take 1 tablet (0.125 mg total) by mouth every 6 (six) hours as needed for cramping.    Dispense:  90 tablet    Refill:  0    Order Specific Question:   Supervising Provider    Answer:   Cassandria Anger [[id number]]    Follow-up: Return if symptoms worsen or fail to improve.  Wilfred Lacy, NP

## 2017-07-19 NOTE — Patient Instructions (Addendum)
Please let us know if there is another GI in your insurance network.  Stop linzess.  Metformin has been changed to once a day extended release.  Please look in FODMAP diet.  Encourage adequate oral hydration.  Also try IBGuard probiotic OTC once a day.

## 2017-07-20 ENCOUNTER — Telehealth: Payer: Self-pay | Admitting: Nurse Practitioner

## 2017-07-20 NOTE — Telephone Encounter (Signed)
I contacted Ms. Heather Bowman to inquire if right axillary mass was evaluated by surgeon and what was the outcome.  She informed me that she was evaluated by [facility] surgery and mass was excised. She reports that pathology report indicated benign tumor. She also completed physical therapy after surgical  Procedure. Physical Therapy was done through [facility] 01/2017-04/2017. She denies any residual pain or swelling post surgery.  I am unable to locate surgical report nor pathology report electronically. She will need to sign medical release to get report from [place] surgery.

## 2017-08-01 ENCOUNTER — Ambulatory Visit (INDEPENDENT_AMBULATORY_CARE_PROVIDER_SITE_OTHER): Payer: BLUE CROSS/BLUE SHIELD | Admitting: Physician Assistant

## 2017-08-01 ENCOUNTER — Other Ambulatory Visit (INDEPENDENT_AMBULATORY_CARE_PROVIDER_SITE_OTHER): Payer: BLUE CROSS/BLUE SHIELD

## 2017-08-01 ENCOUNTER — Encounter: Payer: Self-pay | Admitting: Physician Assistant

## 2017-08-01 VITALS — BP 120/76 | HR 95 | Ht 65.0 in | Wt 180.0 lb

## 2017-08-01 DIAGNOSIS — R197 Diarrhea, unspecified: Secondary | ICD-10-CM | POA: Diagnosis not present

## 2017-08-01 DIAGNOSIS — R140 Abdominal distension (gaseous): Secondary | ICD-10-CM | POA: Diagnosis not present

## 2017-08-01 DIAGNOSIS — R112 Nausea with vomiting, unspecified: Secondary | ICD-10-CM | POA: Diagnosis not present

## 2017-08-01 LAB — CBC WITH DIFFERENTIAL/PLATELET
BASOS ABS: 0 10*3/uL (ref 0.0–0.1)
Basophils Relative: 0.9 % (ref 0.0–3.0)
EOS ABS: 0 10*3/uL (ref 0.0–0.7)
Eosinophils Relative: 1.1 % (ref 0.0–5.0)
HEMATOCRIT: 40.3 % (ref 36.0–46.0)
Hemoglobin: 13.3 g/dL (ref 12.0–15.0)
LYMPHS PCT: 31.8 % (ref 12.0–46.0)
Lymphs Abs: 1.4 10*3/uL (ref 0.7–4.0)
MCHC: 33 g/dL (ref 30.0–36.0)
MCV: 83.8 fl (ref 78.0–100.0)
MONOS PCT: 7.6 % (ref 3.0–12.0)
Monocytes Absolute: 0.3 10*3/uL (ref 0.1–1.0)
NEUTROS ABS: 2.5 10*3/uL (ref 1.4–7.7)
Neutrophils Relative %: 58.6 % (ref 43.0–77.0)
Platelets: 244 10*3/uL (ref 150.0–400.0)
RBC: 4.81 Mil/uL (ref 3.87–5.11)
RDW: 13.6 % (ref 11.5–15.5)
WBC: 4.3 10*3/uL (ref 4.0–10.5)

## 2017-08-01 LAB — COMPREHENSIVE METABOLIC PANEL
ALT: 8 U/L (ref 0–35)
AST: 11 U/L (ref 0–37)
Albumin: 4.2 g/dL (ref 3.5–5.2)
Alkaline Phosphatase: 38 U/L — ABNORMAL LOW (ref 39–117)
BUN: 13 mg/dL (ref 6–23)
CO2: 28 mEq/L (ref 19–32)
Calcium: 9.6 mg/dL (ref 8.4–10.5)
Chloride: 105 mEq/L (ref 96–112)
Creatinine, Ser: 0.92 mg/dL (ref 0.40–1.20)
GFR: 72.49 mL/min (ref >60.00)
Glucose, Bld: 98 mg/dL (ref 70–99)
Potassium: 4.4 mEq/L (ref 3.5–5.1)
Sodium: 138 mEq/L (ref 135–145)
Total Bilirubin: 0.4 mg/dL (ref 0.2–1.2)
Total Protein: 7.3 g/dL (ref 6.0–8.3)

## 2017-08-01 LAB — SEDIMENTATION RATE: Sed Rate: 23 mm/hr — ABNORMAL HIGH (ref 0–20)

## 2017-08-01 LAB — HIGH SENSITIVITY CRP: CRP HIGH SENSITIVITY: 3.6 mg/L (ref 0.000–5.000)

## 2017-08-01 LAB — IGA: IGA: 139 mg/dL (ref 68–378)

## 2017-08-01 MED ORDER — FAMOTIDINE 40 MG PO TABS: 40.0000 mg | ORAL_TABLET | Freq: Two times a day (BID) | ORAL | 6 refills | Status: DC

## 2017-08-01 NOTE — Progress Notes (Addendum)
Subjective:    Patient ID: Heather Bowman, female    DOB: December 09, 1980, 36 y.o.   MRN: [id number]  HPI Heather Bowman is a pleasant 36 year old African-American female, new to GI today referred by  Wilfred Lacy NP for evaluation of recent complaints of nausea vomiting and diarrhea. Patient is known very remotely to Dr. Henrene Pastor and had undergone an EGD in 2008 that time also with complaints of nausea vomiting and diarrhea and was found to have reflux esophagitis. Patient carries diagnoses of GERD, polycystic ovarian syndrome general anxiety and has a chronic pain syndrome secondary to a previous neck and shoulder injury. She is also undergoing treatment for infertility and is in the process of artificial insemination. Patient says her current symptoms started about 3 weeks ago with nausea vomiting and diarrhea which lasted a couple of weeks. The diarrhea has since subsided and she generally tends more towards constipation. She does have a prescription for Linzess.She continues to complain of bloating and gas and has some residual nausea. He does not feel that her symptoms are worse postprandially. He has been on Protonix recently. She's not had any known infectious exposures, no recent antibiotics. She says the nausea vomiting and diarrhea have been somewhat of a recurrent pain with episodes that will occur sporadically. She's not aware of any specific triggers. She does avoid lactose. She is recently been given a prescription for Levsin which she says has been helpful. She [facility] in [facility] for similar complaints. She was told she had polyps and should have follow-up. She has not done that. No recent labs or imaging   Review of Systems Pertinent positive and negative review of systems were noted in the above HPI section.  All other review of systems was otherwise negative.  Outpatient Encounter Prescriptions as of 08/01/2017  Medication Sig  . hyoscyamine  (LEVSIN, ANASPAZ) 0.125 MG tablet Take 1 tablet (0.125 mg total) by mouth every 6 (six) hours as needed for cramping.  . lurasidone (LATUDA) 40 MG TABS tablet Take 1 tablet (40 mg total) by mouth daily with breakfast.  . metFORMIN (GLUCOPHAGE-XR) 500 MG 24 hr tablet Take 1 tablet (500 mg total) by mouth daily with breakfast.  . pantoprazole (PROTONIX) 40 MG tablet Take 1 tablet (40 mg total) by mouth daily.  . promethazine (PHENERGAN) 25 MG tablet Take 0.5 tablets (12.5 mg total) by mouth every 8 (eight) hours as needed for nausea or vomiting.  . famotidine (PEPCID) 40 MG tablet Take 1 tablet (40 mg total) by mouth 2 (two) times daily.  Marland Kitchen linaclotide (LINZESS) 145 MCG CAPS capsule Take 1 capsule (145 mcg total) by mouth daily before breakfast. (Patient not taking: Reported on 08/01/2017)  . saccharomyces boulardii (FLORASTOR) 250 MG capsule Take 1 capsule (250 mg total) by mouth 2 (two) times daily. (Patient not taking: Reported on 08/01/2017)  . [DISCONTINUED] amitriptyline (ELAVIL) 25 MG tablet Take 50 mg by mouth at bedtime.   . [DISCONTINUED] Oxycodone-Acetaminophen (PERCOCET PO) Take by mouth.  . [DISCONTINUED] Prenatal MV-Min-Fe Fum-FA-DHA (PRENATAL MULTI +DHA) 27-0.8-200 MG CAPS Take 1 capsule by mouth daily.  . [DISCONTINUED] Prenatal Vit-Fe Fumarate-FA (PRENATAL VITAMIN PLUS LOW IRON) 27-1 MG TABS Take 1 tablet by mouth daily with lunch.  . [DISCONTINUED] tiZANidine (ZANAFLEX) 4 MG tablet Take 4 mg by mouth 3 (three) times daily as needed.  . [DISCONTINUED] venlafaxine (EFFEXOR) 25 MG tablet TAKE 5 TABLETS BY MOUTH 3 TIMES A DAY   No facility-administered encounter medications on file  as of 08/01/2017.    No Known Allergies Patient Active Problem List   Diagnosis Date Noted  . Endometrial polyp 12/30/2016  . Constipation 12/03/2016  . Axillary mass, right 12/03/2016  . Painful pinna of both ears on examination 05/14/2016  . Infertility counseling 03/21/2015  . Axillary adenitis  08/07/2013  . Excessive sweating 06/13/2013  . Nausea 01/17/2013  . PCOS (polycystic ovarian syndrome) 05/26/2012  . GAD (generalized anxiety disorder) 05/02/2012  . Chronic pain syndrome 05/02/2012  . GERD (gastroesophageal reflux disease) 05/02/2012   Social History   Social History  . Marital status: Single    Spouse name: N/A  . Number of children: N/A  . Years of education: N/A   Occupational History  . Not on file.   Social History Main Topics  . Smoking status: Never Smoker  . Smokeless tobacco: Never Used  . Alcohol use Yes     Comment: on holidays or special occasions  . Drug use: No  . Sexual activity: Yes    Birth control/ protection: None     Comment: one partner for over 4 years, patient says she is infertile   Other Topics Concern  . Not on file   Social History Narrative  . No narrative on file    Heather Bowman's family history includes Asthma in her brother; Breast cancer in her paternal aunt; Cancer in her maternal grandmother; Depression in her sister; Diabetes in her father and sister; Heart disease in her father; Hyperlipidemia in her father and sister; Hypertension in her father and sister; Stroke in her father.      Objective:    Vitals:   08/01/17 1104  BP: 120/76  Pulse: 95    Physical Exam; Well-developed African-American female in no acute distress, pleasant blood pressure 120/76, pulse 95, height 5 foot 5, weight 180, BMI 29.9. HEENT ;nontraumatic normocephalic EOMI PERRLA sclera anicteric, Cardiovascular; regular rate and rhythm with S1-S2 no murmur rub or gallop, Pulmonary; clear bilaterally, Abdomen; soft, no focal tenderness, no guarding or rebound no palpable mass or hepatosplenomegaly bowel sounds are present, Rectal ;exam not done, Extremities ;no clubbing cyanosis or edema skin warm and dry, Neuropsych; mood and affect appropriate       Assessment & Plan:   #10 36 year old African-American female with recent episode of nausea  vomiting and diarrhea lasting about 2 weeks. Patient has some residual nausea and bloating. She relates periodic similar episodes over the past few years. I suspect she did have an infectious gastroenteritis recently. Given recurrent spells of nausea vomiting and diarrhea suspect underlying IBS, rule out cholelithiasis, biliary dyskinesia with recurrent nausea and vomiting #2 polycystic ovarian syndrome #3 generalized anxiety disorder #4 chronic pain syndrome secondary to prior neck injury, on opioids  #5 patient currently undergoing artificial insemination- history of infertility  Plan; CBC with differential, see met, sedimentation rate CRP TTG and IgA Stop Protonix as anticipating pregnancy Start Pepcid 40 mg by mouth twice a day as needed Schedule upper abdominal ultrasound  Trial of IB GARD  2 by mouth twice a day. Emphasized today that we would try to minimize invasive workup and medications given that she is actively aiming for pregnancy in the very near future.  Received copy of patient's prior colonoscopy which was done 2009 at [place]. She had a single 1.5 cm polyp in the sigmoid colon removed. Path consistent with an adenomatous polyp, she also had random colon biopsies done showing no evidence of microscopic colitis. Patient needs a follow-up colonoscopy.  Will need to contact patient to discuss in detail regarding timing as she is undergoing artificial insemination currently.  Amy S Esterwood PA-C 08/01/2017   Cc: Flossie Buffy, NP

## 2017-08-01 NOTE — Patient Instructions (Addendum)
Please go to the basement level to have your labs drawn.  You have been scheduled for an abdominal ultrasound at [facility] (1st floor of hospital) on Thursday 08-04-2017 at 8:00 am. Please arrive 15 minutes prior to your appointment for registration. Make certain not to have anything to eat or drink 6 hours prior to your appointment. Should you need to reschedule your appointment, please contact radiology at [phone]. This test typically takes about 30 minutes to perform.  We have sent the following medications to your pharmacy for you to pick up at your convenience: [place].  Pepcid 40 mg- take 1 tab twice daily. Stop the Protonix 40 mg.  We have provided you samples of IBGARD. Take 2 capsules twice daily.  You can get this over the counter at your pharmacy, [facility], [place], [employer]. [employer].   We have provided you with a low gas diet handout.

## 2017-08-02 NOTE — Progress Notes (Signed)
Initial assessment and plans reviewed 

## 2017-08-04 ENCOUNTER — Ambulatory Visit (HOSPITAL_COMMUNITY)
Admission: RE | Admit: 2017-08-04 | Discharge: 2017-08-04 | Disposition: A | Discharge status: Home / Self Care | Payer: BLUE CROSS/BLUE SHIELD | Source: Ambulatory Visit | Attending: Physician Assistant | Admitting: Physician Assistant

## 2017-08-04 ENCOUNTER — Telehealth: Payer: Self-pay

## 2017-08-04 DIAGNOSIS — R140 Abdominal distension (gaseous): Secondary | ICD-10-CM

## 2017-08-04 DIAGNOSIS — R112 Nausea with vomiting, unspecified: Secondary | ICD-10-CM

## 2017-08-04 DIAGNOSIS — R197 Diarrhea, unspecified: Secondary | ICD-10-CM | POA: Diagnosis not present

## 2017-08-04 LAB — TISSUE TRANSGLUTAMINASE, IGG: (TTG) AB, IGG: 2 U/mL

## 2017-08-04 NOTE — Telephone Encounter (Signed)
I have left a message on the patient's voicemail. DPR is on file.

## 2017-08-04 NOTE — Telephone Encounter (Signed)
-----   Message from Alfredia Ferguson, [place] sent at 08/03/2017  3:03 PM EDT ----- Regarding: Colon Please let pt know I got her old Colonoscopy record from [facility] and she had Colonoscopy in 2009 with removal of a 1 cm polyp which was a tubular adenoma . She needs another Colonoscopy and is way overdue .  She is undergoing infertility treatment with artificial insem  Etc , and actively trying to get pregnant . If she wants to proceed now with Colon we would have to know and document that she is not pregnant ibuprofen guess we could make a note to contact her again in  4 months - and go from there . Please convert to phone note so part of permanent record

## 2017-08-08 NOTE — Telephone Encounter (Signed)
Patient does want to have the colonoscopy (asap). I stated to her she absolutely must not be pregnant if going to have her colonoscopy. Do you want a serum hcg the day before the colonoscopy?

## 2017-08-09 ENCOUNTER — Other Ambulatory Visit: Payer: Self-pay

## 2017-08-09 DIAGNOSIS — Z1211 Encounter for screening for malignant neoplasm of colon: Secondary | ICD-10-CM

## 2017-08-09 DIAGNOSIS — N912 Amenorrhea, unspecified: Secondary | ICD-10-CM

## 2017-08-09 NOTE — Telephone Encounter (Signed)
Ok...lets do serum pregnancy test 2 days before so she wont have started prep - Dr Henrene Pastor was supervising that day- If he doesn't have any openings soon , and [facility] does  We can schedule with him and Ill send him a note

## 2017-08-12 ENCOUNTER — Other Ambulatory Visit: Payer: BLUE CROSS/BLUE SHIELD

## 2017-08-12 ENCOUNTER — Encounter: Payer: Self-pay | Admitting: Internal Medicine

## 2017-08-12 ENCOUNTER — Ambulatory Visit (INDEPENDENT_AMBULATORY_CARE_PROVIDER_SITE_OTHER): Payer: BLUE CROSS/BLUE SHIELD | Admitting: Internal Medicine

## 2017-08-12 VITALS — BP 122/86 | HR 104 | Temp 98.5°F | Ht 65.0 in | Wt 185.0 lb

## 2017-08-12 DIAGNOSIS — R300 Dysuria: Secondary | ICD-10-CM | POA: Diagnosis not present

## 2017-08-12 LAB — POCT URINALYSIS DIPSTICK
Bilirubin, UA: NEGATIVE
Blood, UA: NEGATIVE
Glucose, UA: NEGATIVE
Ketones, UA: NEGATIVE
LEUKOCYTES UA: NEGATIVE
Nitrite, UA: NEGATIVE
PROTEIN UA: NEGATIVE
Spec Grav, UA: >=1.03 — AB (ref 1.010–1.025)
UROBILINOGEN UA: 0.2 U/dL
pH, UA: 6 (ref 5.0–8.0)

## 2017-08-12 MED ORDER — METRONIDAZOLE 1 % EX GEL
Freq: Every day | CUTANEOUS | 0 refills | Status: DC
Start: 2017-08-12 — End: 2017-08-31

## 2017-08-12 NOTE — Assessment & Plan Note (Signed)
U/A done in the office without signs of infection. Sent for culture. Rx for metronidazole gel for possible BV. If no resolution she will follow up with gyn.

## 2017-08-12 NOTE — Progress Notes (Signed)
   Subjective:    Patient ID: Heather Bowman, female    DOB: July 20, 1981, 36 y.o.   MRN: [id number]  HPI The patient is a 36 YO female coming in for odor with urine/vaginal. Has had BV before. She is not sure if she has bladder infection. Some burning with urination. Denies risk of pregnancy. Last period 2 weeks ago. Denies risk of STD. Denies fevers or chills. Some chronic nausea and stomach symptoms which are unchanged. Denies back pain.   Review of Systems  Constitutional: Negative.   Respiratory: Negative.   Gastrointestinal: Negative.   Genitourinary: Positive for dysuria. Negative for decreased urine volume, enuresis, frequency, genital sores, hematuria, urgency and vaginal bleeding.  Skin: Negative.   Neurological: Negative.       Objective:   Physical Exam  Constitutional: She is oriented to person, place, and time. She appears well-developed and well-nourished.  HENT:  Head: Normocephalic and atraumatic.  Eyes: EOM are normal.  Neck: Normal range of motion.  Cardiovascular: Normal rate and regular rhythm.   Pulmonary/Chest: Effort normal and breath sounds normal.  Abdominal: Soft. Bowel sounds are normal. She exhibits no distension. There is no tenderness. There is no rebound.  Neurological: She is alert and oriented to person, place, and time.  Skin: Skin is warm and dry.   Vitals:   08/12/17 1619  BP: 122/86  Pulse: (!) 104  Temp: 98.5 F (36.9 C)  TempSrc: Oral  SpO2: 100%  Weight: 185 lb (83.9 kg)  Height: 5\' 5"  (1.651 m)      Assessment & Plan:

## 2017-08-12 NOTE — Patient Instructions (Signed)
We have sent in the gel for you.   We have sent the urine for a culture and you should hear back Monday.

## 2017-08-14 LAB — URINE CULTURE
MICRO NUMBER: 81140244
SPECIMEN QUALITY: ADEQUATE

## 2017-08-16 ENCOUNTER — Telehealth: Payer: Self-pay | Admitting: Internal Medicine

## 2017-08-16 NOTE — Telephone Encounter (Signed)
PA for Metrogel 1% started  Key: [id number]  Waiting for the response from [facility]

## 2017-08-23 NOTE — Telephone Encounter (Signed)
Spoke with pt, she already got the gel and pt report symptoms is better.

## 2017-08-23 NOTE — Telephone Encounter (Signed)
She was purchase out of pocket, if too expensive call back

## 2017-08-23 NOTE — Telephone Encounter (Signed)
PA was denied. Please advise

## 2017-08-31 ENCOUNTER — Ambulatory Visit (AMBULATORY_SURGERY_CENTER): Payer: Self-pay | Admitting: *Deleted

## 2017-08-31 VITALS — Ht 65.0 in | Wt 185.0 lb

## 2017-08-31 DIAGNOSIS — Z86010 Personal history of colonic polyps: Secondary | ICD-10-CM

## 2017-08-31 MED ORDER — NA SULFATE-K SULFATE-MG SULF 17.5-3.13-1.6 GM/177ML PO SOLN: 1.0000 | Freq: Once | ORAL | 0 refills | Status: AC

## 2017-08-31 NOTE — Progress Notes (Signed)
No egg or soy allergy known to patient  No issues with past sedation with any surgeries  or procedures, no intubation problems  No diet pills per patient No home 02 use per patient  No blood thinners per patient  Pt denies issues with constipation  No A fib or A flutter  EMMI video sent to pt's e mail  -pt declined emmi Pt is to have labs to check to make sure not pregnant 09-27-17- pt made aware today in [facility]

## 2017-09-05 ENCOUNTER — Encounter: Payer: Self-pay | Admitting: Nurse Practitioner

## 2017-09-05 ENCOUNTER — Ambulatory Visit (INDEPENDENT_AMBULATORY_CARE_PROVIDER_SITE_OTHER): Payer: BLUE CROSS/BLUE SHIELD | Admitting: Nurse Practitioner

## 2017-09-05 ENCOUNTER — Ambulatory Visit (INDEPENDENT_AMBULATORY_CARE_PROVIDER_SITE_OTHER)
Admission: RE | Admit: 2017-09-05 | Discharge: 2017-09-05 | Disposition: A | Discharge status: Home / Self Care | Payer: BLUE CROSS/BLUE SHIELD | Source: Ambulatory Visit | Attending: Nurse Practitioner | Admitting: Nurse Practitioner

## 2017-09-05 VITALS — BP 120/86 | HR 93 | Temp 97.9°F | Ht 65.0 in | Wt 187.0 lb

## 2017-09-05 DIAGNOSIS — M25462 Effusion, left knee: Secondary | ICD-10-CM

## 2017-09-05 DIAGNOSIS — M25562 Pain in left knee: Secondary | ICD-10-CM | POA: Diagnosis not present

## 2017-09-05 DIAGNOSIS — S8992XA Unspecified injury of left lower leg, initial encounter: Secondary | ICD-10-CM

## 2017-09-05 MED ORDER — OXYCODONE-ACETAMINOPHEN 5-325 MG PO TABS: 1.0000 | ORAL_TABLET | Freq: Three times a day (TID) | ORAL | 0 refills | Status: DC | PRN

## 2017-09-05 MED ORDER — NAPROXEN 500 MG PO TABS: 500.0000 mg | ORAL_TABLET | Freq: Two times a day (BID) | ORAL | 0 refills | Status: DC | PRN

## 2017-09-05 NOTE — Progress Notes (Signed)
Subjective:  Patient ID: Heather Bowman, female    DOB: Aug 13, 1981  Age: 36 y.o. MRN: [id number]  CC: Knee Pain (left knee pain,swelling. 1 day)   Knee Pain   The incident occurred 12 to 24 hours ago. The injury mechanism was a twisting injury and a fall. The pain is present in the left leg and left knee. The quality of the pain is described as aching. The pain is at a severity of 10/10. The pain is severe. The pain has been constant since onset. Associated symptoms include an inability to bear weight and muscle weakness. Pertinent negatives include no loss of motion, loss of sensation, numbness or tingling. She reports no foreign bodies present. The symptoms are aggravated by movement, palpation and weight bearing. She has tried elevation, ice and immobilization for the symptoms. The treatment provided no relief.    Outpatient Medications Prior to Visit  Medication Sig Dispense Refill  . escitalopram (LEXAPRO) 20 MG tablet Take 20 mg by mouth daily.    . famotidine (PEPCID) 40 MG tablet Take 1 tablet (40 mg total) by mouth 2 (two) times daily. 60 tablet 6  . hyoscyamine (LEVSIN, ANASPAZ) 0.125 MG tablet Take 1 tablet (0.125 mg total) by mouth every 6 (six) hours as needed for cramping. 90 tablet 0  . linaclotide (LINZESS) 145 MCG CAPS capsule Take 1 capsule (145 mcg total) by mouth daily before breakfast. 90 capsule 1  . metFORMIN (GLUCOPHAGE-XR) 500 MG 24 hr tablet Take 1 tablet (500 mg total) by mouth daily with breakfast. 90 tablet 3  . metroNIDAZOLE (METROGEL) 0.75 % vaginal gel Place 1 Applicatorful vaginally 2 (two) times daily.  0  . oxyCODONE-acetaminophen (PERCOCET) 10-325 MG tablet Take 1 tablet by mouth every 6 (six) hours as needed for pain.    . pantoprazole (PROTONIX) 40 MG tablet Take 1 tablet (40 mg total) by mouth daily. 90 tablet 1  . QUEtiapine (SEROQUEL) 50 MG tablet Take by mouth.    . saccharomyces boulardii (FLORASTOR) 250 MG capsule Take 1 capsule (250 mg total) by mouth  2 (two) times daily. 60 capsule 1  . promethazine (PHENERGAN) 25 MG tablet Take 0.5 tablets (12.5 mg total) by mouth every 8 (eight) hours as needed for nausea or vomiting. 90 tablet 1   No facility-administered medications prior to visit.     ROS See HPI  Objective:  BP 120/86   Pulse 93   Temp 97.9 F (36.6 C)   Ht 5\' 5"  (1.651 m)   Wt 187 lb (84.8 kg)   LMP 09/03/2017   SpO2 97%   BMI 31.12 kg/m   BP Readings from Last 3 Encounters:  09/05/17 120/86  08/12/17 122/86  08/01/17 120/76    Wt Readings from Last 3 Encounters:  09/05/17 187 lb (84.8 kg)  08/31/17 185 lb (83.9 kg)  08/12/17 185 lb (83.9 kg)    Physical Exam  Constitutional: She is oriented to person, place, and time. No distress.  Cardiovascular: Normal rate.  Pulmonary/Chest: Effort normal.  Musculoskeletal: She exhibits edema and tenderness. She exhibits no deformity.       Left knee: She exhibits decreased range of motion, swelling, effusion and bony tenderness. She exhibits no laceration, no erythema and normal alignment. Tenderness found. Lateral joint line and patellar tendon tenderness noted. No medial joint line tenderness noted.       Left ankle: Normal.       Left upper leg: Normal.       Left lower  leg: Normal.       Left foot: Normal.  Neurological: She is alert and oriented to person, place, and time.  Skin: Skin is warm and dry. No rash noted. No erythema.  Psychiatric: She has a normal mood and affect. Her behavior is normal.  Vitals reviewed.   Lab Results  Component Value Date   WBC 4.3 08/01/2017   HGB 13.3 08/01/2017   HCT 40.3 08/01/2017   PLT 244.0 08/01/2017   GLUCOSE 98 08/01/2017   CHOL 270 (H) 12/03/2016   TRIG 138.0 12/03/2016   HDL 67.80 12/03/2016   LDLCALC 175 (H) 12/03/2016   ALT 8 08/01/2017   AST 11 08/01/2017   NA 138 08/01/2017   K 4.4 08/01/2017   CL 105 08/01/2017   CREATININE 0.92 08/01/2017   BUN 13 08/01/2017   CO2 28 08/01/2017   TSH 1.26  12/03/2016   HGBA1C 5.7% 07/19/2017    US Abdomen Complete  Result Date: 08/04/2017 CLINICAL DATA:  Nausea and intractable vomiting. EXAM: ABDOMEN ULTRASOUND COMPLETE COMPARISON:  None. FINDINGS: Gallbladder: No gallstones or wall thickening visualized. No sonographic Murphy sign noted by sonographer. Common bile duct: Diameter: 3.4 mm Liver: There is 7 mm cyst in the liver which is otherwise normal in appearance. Portal vein is patent on color Doppler imaging with normal direction of blood flow towards the liver. IVC: No abnormality visualized. Pancreas: Visualized portion unremarkable. Spleen: Size and appearance within normal limits. Right Kidney: Length: 10.3 cm. Echogenicity within normal limits. No mass or hydronephrosis visualized. Left Kidney: Length: 10.4 cm. Echogenicity within normal limits. No mass or hydronephrosis visualized. Abdominal aorta: No aneurysm visualized. Other findings: None. IMPRESSION: 1. No cause for the patient's symptoms identified. Electronically Signed   By: Dorise Bullion III M.D   On: 08/04/2017 12:25    Assessment & Plan:   Heather Bowman was seen today for knee pain.  Diagnoses and all orders for this visit:  Pain and swelling of knee, left -     DG Knee Complete 4 Views Left; Future -     Ambulatory referral to Sports Medicine -     naproxen (NAPROSYN) 500 MG tablet; Take 1 tablet (500 mg total) 2 (two) times daily as needed by mouth (for pain, take with food). -     oxyCODONE-acetaminophen (PERCOCET/ROXICET) 5-325 MG tablet; Take 1 tablet every 8 (eight) hours as needed by mouth for severe pain.  Injury of left knee, initial encounter -     DG Knee Complete 4 Views Left; Future -     Ambulatory referral to Sports Medicine -     naproxen (NAPROSYN) 500 MG tablet; Take 1 tablet (500 mg total) 2 (two) times daily as needed by mouth (for pain, take with food). -     oxyCODONE-acetaminophen (PERCOCET/ROXICET) 5-325 MG tablet; Take 1 tablet every 8 (eight) hours as  needed by mouth for severe pain.   I am having Heather Bowman start on naproxen and oxyCODONE-acetaminophen. I am also having her maintain her saccharomyces boulardii, pantoprazole, linaclotide, metFORMIN, hyoscyamine, famotidine, escitalopram, QUEtiapine, metroNIDAZOLE, and oxyCODONE-acetaminophen.  Meds ordered this encounter  Medications  . naproxen (NAPROSYN) 500 MG tablet    Sig: Take 1 tablet (500 mg total) 2 (two) times daily as needed by mouth (for pain, take with food).    Dispense:  30 tablet    Refill:  0    Order Specific Question:   Supervising Provider    Answer:   Cassandria Anger [[id number]]  .  oxyCODONE-acetaminophen (PERCOCET/ROXICET) 5-325 MG tablet    Sig: Take 1 tablet every 8 (eight) hours as needed by mouth for severe pain.    Dispense:  6 tablet    Refill:  0    Order Specific Question:   Supervising Provider    Answer:   Cassandria Anger [[id number]]    Follow-up: No Follow-up on file.  Wilfred Lacy, NP

## 2017-09-05 NOTE — Patient Instructions (Addendum)
Patient provided with pair of crutches in office. She is to return to office for knee brace once it is received.  normal joint space and alignment. F/up with sports medicine or further evaluation of possible meniscus or ligament injury?

## 2017-09-06 ENCOUNTER — Other Ambulatory Visit: Payer: Self-pay | Admitting: Nurse Practitioner

## 2017-09-06 DIAGNOSIS — K219 Gastro-esophageal reflux disease without esophagitis: Secondary | ICD-10-CM

## 2017-09-06 DIAGNOSIS — K582 Mixed irritable bowel syndrome: Secondary | ICD-10-CM

## 2017-09-06 DIAGNOSIS — R112 Nausea with vomiting, unspecified: Secondary | ICD-10-CM

## 2017-09-06 NOTE — Telephone Encounter (Signed)
pleasae advise. Both med sent in 07/05/2017 for 6 mo supply. Check note,

## 2017-09-07 ENCOUNTER — Ambulatory Visit: Payer: BLUE CROSS/BLUE SHIELD | Admitting: Internal Medicine

## 2017-09-08 ENCOUNTER — Ambulatory Visit (INDEPENDENT_AMBULATORY_CARE_PROVIDER_SITE_OTHER): Payer: BLUE CROSS/BLUE SHIELD | Admitting: Family Medicine

## 2017-09-08 ENCOUNTER — Encounter: Payer: Self-pay | Admitting: Family Medicine

## 2017-09-08 VITALS — BP 122/88 | HR 88 | Temp 98.4°F | Ht 65.0 in | Wt 187.0 lb

## 2017-09-08 DIAGNOSIS — M2500 Hemarthrosis, unspecified joint: Secondary | ICD-10-CM

## 2017-09-08 NOTE — Assessment & Plan Note (Signed)
Hemarthrosis to suggest intra-articular damage. Findings suggestive of a meniscal tear. - Aspiration today. -Placed in a DonJoy brace - MRI of the left knee to evaluate for meniscal tear and the posterior lateral compartment

## 2017-09-08 NOTE — Progress Notes (Signed)
Elidia Bonenfant - 36 y.o. female MRN [id number]  Date of birth: [birth year]  SUBJECTIVE:  Including CC & ROS.  Chief Complaint  Patient presents with  . Left knee pain    She stepped down from the back of a truck and she felt a twist of her knee. She has been taking naproxen for the pain and ice for swelling.     Ms. Glab is a 36 y.o. female that is presenting with left knee pain. 2 days ago she was stepping out of a truck and twisted her knee. She had significant pain and swelling since that time. She has been using ice and ibuprofen to help with the pain. He is having pain on the lateral aspect of her left knee. The pain is localized in this area. The pain is intense and causing her to limp and uses crutches to walk. She denies any previous knee injury or surgery. Has had some improvement of her swelling. Denies any fevers or chills.  Independent review of the knee x-rays from 11/5 shows effusion   Review of Systems  Constitutional: Negative for fever.  Musculoskeletal: Positive for gait problem and joint swelling. Negative for arthralgias.  Skin: Negative for color change.  Neurological: Negative for weakness and numbness.  Hematological: Negative for adenopathy.    HISTORY: Past Medical, Surgical, Social, and Family History Reviewed & Updated per EMR.   Pertinent Historical Findings include:  Past Medical History:  Diagnosis Date  . Anxiety   . Arthritis   . Breast mass    right axilla  . Chronic pain syndrome   . Constipation    uses linzess daily   . Depression   . GERD (gastroesophageal reflux disease)   . IBS (irritable bowel syndrome)   . PCOS (polycystic ovarian syndrome)    on metformin for this    Past Surgical History:  Procedure Laterality Date  . COLONOSCOPY    . MYOMECTOMY    . POLYPECTOMY    . right axilla breast mass removal      No Known Allergies  Family History  Problem Relation Age of Onset  . Heart disease Father   . Diabetes Father   .  Hyperlipidemia Father   . Hypertension Father   . Stroke Father   . Depression Sister   . Diabetes Sister   . Hyperlipidemia Sister   . Hypertension Sister   . Asthma Brother   . Cancer Maternal Grandmother   . Breast cancer Paternal Aunt   . Colon cancer Neg Hx   . Colon polyps Neg Hx   . Esophageal cancer Neg Hx   . Pancreatic cancer Neg Hx   . Rectal cancer Neg Hx   . Stomach cancer Neg Hx      Social History   Socioeconomic History  . Marital status: Single    Spouse name: Not on file  . Number of children: Not on file  . Years of education: Not on file  . Highest education level: Not on file  Social Needs  . Financial resource strain: Not on file  . Food insecurity - worry: Not on file  . Food insecurity - inability: Not on file  . Transportation needs - medical: Not on file  . Transportation needs - non-medical: Not on file  Occupational History  . Not on file  Tobacco Use  . Smoking status: Never Smoker  . Smokeless tobacco: Never Used  Substance and Sexual Activity  . Alcohol use: Yes  Comment: on holidays or special occasions  . Drug use: No  . Sexual activity: Yes    Birth control/protection: None    Comment: one partner for over 4 years, patient says she is infertile  Other Topics Concern  . Not on file  Social History Narrative  . Not on file     PHYSICAL EXAM:  VS: BP 122/88 (BP Location: Left Arm, Patient Position: Sitting, Cuff Size: Normal)   Pulse 88   Temp 98.4 F (36.9 C) (Oral)   Ht 5\' 5"  (1.651 m)   Wt 187 lb (84.8 kg)   LMP 09/03/2017   SpO2 99%   BMI 31.12 kg/m  Physical Exam Gen: NAD, alert, cooperative with exam, well-appearing ENT: normal lips, normal nasal mucosa,  Eye: normal EOM, normal conjunctiva and lids CV:  no edema, +2 pedal pulses   Resp: no accessory muscle use, non-labored,  GI: no masses or tenderness, no hernia  Skin: no rashes, no areas of induration  Neuro: normal tone, normal sensation to touch Psych:   normal insight, alert and oriented MSK:  Left knee: Obvious effusion. Tenderness to palpation lateral joint line. No significant tenderness to the medial joint line. Range of motion is limited secondary to pain. Negative Lachman's test. Unable to stand on the left leg. Neurovascularly intact.  Limited ultrasound: Left knee:  Moderate effusion in the suprapatellar pouch. Medial meniscus and MCL are intact. Lateral meniscus with suggestions of a tear. Hypoechoic change at the urgent of the LCL to suggest a mild sprain.  Summary: Left knee effusion and changes suggestive of meniscal tear  Ultrasound and interpretation by Clearance Coots, MD          Aspiration/Injection Procedure Note Leamon Arnt Nov 05, 1980  Procedure: Aspiration Indications: left knee pain and effusion   Procedure Details Consent: Risks of procedure as well as the alternatives and risks of each were explained to the (patient/caregiver).  Consent for procedure obtained. Time Out: Verified patient identification, verified procedure, site/side was marked, verified correct patient position, special equipment/implants available, medications/allergies/relevent history reviewed, required imaging and test results available.  Performed.  The area was cleaned with iodine and alcohol swabs.    The left knee joint was aspirated using 3 cc's of 1% lidocaine with a 25 1 1/2" needle. An 18-gauge needle was used on a 60 mL syringe and 24 cc of blood-tinged fluid was aspirated.  Ultrasound was used. Images were obtained in  Long views showing the aspiration.    Amount of Fluid Aspirated: 40mL Character of Fluid: bloody Fluid was sent for:n/a A sterile dressing was applied.  Patient did tolerate procedure well.        ASSESSMENT & PLAN:   Hemarthrosis Hemarthrosis to suggest intra-articular damage. Findings suggestive of a meniscal tear. - Aspiration today. -Placed in a DonJoy brace - MRI of the left knee to  evaluate for meniscal tear and the posterior lateral compartment

## 2017-09-08 NOTE — Patient Instructions (Signed)
Thank you for coming in,   We will call with the results from the MRI.      Please feel free to call with any questions or concerns at any time, at [phone]. --Dr. Raeford Razor

## 2017-09-09 ENCOUNTER — Ambulatory Visit: Payer: BLUE CROSS/BLUE SHIELD | Admitting: Family Medicine

## 2017-09-15 ENCOUNTER — Encounter: Payer: Self-pay | Admitting: Internal Medicine

## 2017-09-20 ENCOUNTER — Ambulatory Visit
Admission: RE | Admit: 2017-09-20 | Discharge: 2017-09-20 | Disposition: A | Discharge status: Home / Self Care | Payer: BLUE CROSS/BLUE SHIELD | Source: Ambulatory Visit | Attending: Family Medicine | Admitting: Family Medicine

## 2017-09-20 ENCOUNTER — Telehealth: Payer: Self-pay | Admitting: Family Medicine

## 2017-09-20 DIAGNOSIS — M2500 Hemarthrosis, unspecified joint: Secondary | ICD-10-CM

## 2017-09-20 NOTE — Telephone Encounter (Signed)
Left VM for patient. If she calls back please have her speak with a nurse/CMA and inform that her left knee MRI shows an ACL tear and meniscal tears. Will discuss where she would like to be sent for the consideration of a surgery.   If any questions then please take the best time and phone number to call and I will try to call her back.   Rosemarie Ax, MD [facility] 09/20/2017, 10:26 AM

## 2017-09-26 ENCOUNTER — Telehealth: Payer: Self-pay | Admitting: Family Medicine

## 2017-09-26 NOTE — Telephone Encounter (Signed)
MRI results given to patient.

## 2017-09-27 ENCOUNTER — Telehealth: Payer: Self-pay | Admitting: Family Medicine

## 2017-09-27 ENCOUNTER — Other Ambulatory Visit: Payer: BLUE CROSS/BLUE SHIELD

## 2017-09-27 DIAGNOSIS — N912 Amenorrhea, unspecified: Secondary | ICD-10-CM

## 2017-09-27 DIAGNOSIS — Z1211 Encounter for screening for malignant neoplasm of colon: Secondary | ICD-10-CM

## 2017-09-27 DIAGNOSIS — S83512A Sprain of anterior cruciate ligament of left knee, initial encounter: Secondary | ICD-10-CM

## 2017-09-27 NOTE — Addendum Note (Signed)
Addended by: Sallye Ober on: 09/27/2017 07:56 AM   Modules accepted: Orders

## 2017-09-27 NOTE — Telephone Encounter (Signed)
Patient given results of MRI-aware referral was sent in to [facility] Dr. Latanya Maudlin. She is aware she will be contacted with an appointment from their office.

## 2017-09-27 NOTE — Telephone Encounter (Signed)
Copied from [place] [id number]. Topic: Quick Communication - See Telephone Encounter >> Sep 27, 2017  8:19 AM Ether Griffins B wrote: CRM for notification. See Telephone encounter for:  Pt wld like to discuss MRI with Dr. Raeford Razor and finding a doc.  09/27/17.

## 2017-09-28 LAB — HCG, SERUM, QUALITATIVE: PREG SERUM: NEGATIVE

## 2017-09-29 ENCOUNTER — Encounter: Payer: Self-pay | Admitting: Internal Medicine

## 2017-09-29 ENCOUNTER — Other Ambulatory Visit: Payer: Self-pay

## 2017-09-29 ENCOUNTER — Ambulatory Visit (AMBULATORY_SURGERY_CENTER): Payer: BLUE CROSS/BLUE SHIELD | Admitting: Internal Medicine

## 2017-09-29 VITALS — BP 147/95 | HR 80 | Temp 98.9°F | Resp 12 | Ht 65.0 in | Wt 185.0 lb

## 2017-09-29 DIAGNOSIS — Z86010 Personal history of colonic polyps: Secondary | ICD-10-CM | POA: Diagnosis present

## 2017-09-29 DIAGNOSIS — K635 Polyp of colon: Secondary | ICD-10-CM

## 2017-09-29 DIAGNOSIS — R110 Nausea: Secondary | ICD-10-CM

## 2017-09-29 DIAGNOSIS — D125 Benign neoplasm of sigmoid colon: Secondary | ICD-10-CM

## 2017-09-29 MED ORDER — SODIUM CHLORIDE 0.9 % IV SOLN: 500.0000 mL | INTRAVENOUS | Status: DC

## 2017-09-29 MED ORDER — SODIUM CHLORIDE 0.9 % IV SOLN
4.0000 mg | Freq: Once | INTRAVENOUS | Status: AC
  Administered 2017-09-29: 4 mg via INTRAVENOUS

## 2017-09-29 NOTE — Progress Notes (Signed)
Report to PACU, RN, vss, BBS= Clear.  

## 2017-09-29 NOTE — Progress Notes (Signed)
No egg or soy allergy  Pt's states no medical or surgical changes since previsit or office visit.  Pt has a tampon in place today

## 2017-09-29 NOTE — Patient Instructions (Signed)
Handout given on polyps  YOU HAD AN ENDOSCOPIC PROCEDURE TODAY AT THE [facility]:   Refer to the procedure report that was given to you for any specific questions about what was found during the examination.  If the procedure report does not answer your questions, please call your gastroenterologist to clarify.  If you requested that your care partner not be given the details of your procedure findings, then the procedure report has been included in a sealed envelope for you to review at your convenience later.  YOU SHOULD EXPECT: Some feelings of bloating in the abdomen. Passage of more gas than usual.  Walking can help get rid of the air that was put into your GI tract during the procedure and reduce the bloating. If you had a lower endoscopy (such as a colonoscopy or flexible sigmoidoscopy) you may notice spotting of blood in your stool or on the toilet paper. If you underwent a bowel prep for your procedure, you may not have a normal bowel movement for a few days.  Please Note:  You might notice some irritation and congestion in your nose or some drainage.  This is from the oxygen used during your procedure.  There is no need for concern and it should clear up in a day or so.  SYMPTOMS TO REPORT IMMEDIATELY:   Following lower endoscopy (colonoscopy or flexible sigmoidoscopy):  Excessive amounts of blood in the stool  Significant tenderness or worsening of abdominal pains  Swelling of the abdomen that is new, acute  Fever of 100F or higher    For urgent or emergent issues, a gastroenterologist can be reached at any hour by calling [phone].   DIET:  We do recommend a small meal at first, but then you may proceed to your regular diet.  Drink plenty of fluids but you should avoid alcoholic beverages for 24 hours.  ACTIVITY:  You should plan to take it easy for the rest of today and you should NOT DRIVE or use heavy machinery until tomorrow (because of the sedation  medicines used during the test).    FOLLOW UP: Our staff will call the number listed on your records the next business day following your procedure to check on you and address any questions or concerns that you may have regarding the information given to you following your procedure. If we do not reach you, we will leave a message.  However, if you are feeling well and you are not experiencing any problems, there is no need to return our call.  We will assume that you have returned to your regular daily activities without incident.  If any biopsies were taken you will be contacted by phone or by letter within the next 1-3 weeks.  Please call us at [phone] if you have not heard about the biopsies in 3 weeks.    SIGNATURES/CONFIDENTIALITY: You and/or your care partner have signed paperwork which will be entered into your electronic medical record.  These signatures attest to the fact that that the information above on your After Visit Summary has been reviewed and is understood.  Full responsibility of the confidentiality of this discharge information lies with you and/or your care-partner. 

## 2017-09-29 NOTE — Op Note (Signed)
[facility] Patient Name: Heather Bowman Procedure Date: 09/29/2017 4:37 PM MRN: [id number] Endoscopist: Docia Chuck. Henrene Pastor , MD Age: 36 Referring MD:  Date of Birth: November 13, 1980 Gender: Female Account #: [id number] Procedure:                Colonoscopy, With cold snare polypectomy x 2 Indications:              High risk colon cancer surveillance: Personal                            history of adenoma (10 mm or greater in size).                            Prior examination elsewhere 2009. Has not had                            follow-up since Medicines:                Monitored Anesthesia Care Procedure:                Pre-Anesthesia Assessment:                           - Prior to the procedure, a History and Physical                            was performed, and patient medications and                            allergies were reviewed. The patient's tolerance of                            previous anesthesia was also reviewed. The risks                            and benefits of the procedure and the sedation                            options and risks were discussed with the patient.                            All questions were answered, and informed consent                            was obtained. Prior Anticoagulants: The patient has                            taken no previous anticoagulant or antiplatelet                            agents. ASA Grade Assessment: II - A patient with                            mild systemic disease. After reviewing the risks  and benefits, the patient was deemed in                            satisfactory condition to undergo the procedure.                           After obtaining informed consent, the colonoscope                            was passed under direct vision. Throughout the                            procedure, the patient's blood pressure, pulse, and                            oxygen saturations were  monitored continuously. The                            Colonoscope was introduced through the anus and                            advanced to the the cecum, identified by                            appendiceal orifice and ileocecal valve. The                            ileocecal valve, appendiceal orifice, and rectum                            were photographed. The quality of the bowel                            preparation was excellent. The colonoscopy was                            performed without difficulty. The patient tolerated                            the procedure well. The bowel preparation used was                            SUPREP. Scope In: 5:00:48 PM Scope Out: 5:17:06 PM Scope Withdrawal Time: 0 hours 13 minutes 44 seconds  Total Procedure Duration: 0 hours 16 minutes 18 seconds  Findings:                 Two polyps were found in the sigmoid colon. The                            polyps were 1 to 2 mm in size. These polyps were                            removed with a cold snare. Resection and retrieval  were complete.                           The exam was otherwise without abnormality on                            direct and retroflexion views. Complications:            No immediate complications. Estimated blood loss:                            None. Estimated Blood Loss:     Estimated blood loss: none. Impression:               - Two 1 to 2 mm polyps in the sigmoid colon,                            removed with a cold snare. Resected and retrieved.                           - The examination was otherwise normal on direct                            and retroflexion views. Recommendation:           - Repeat colonoscopy in 5 years for                            surveillance(History of advanced adenoma).                           - Patient has a contact number available for                            emergencies. The signs and symptoms of  potential                            delayed complications were discussed with the                            patient. Return to normal activities tomorrow.                            Written discharge instructions were provided to the                            patient.                           - Resume previous diet.                           - Continue present medications.                           - Await pathology results. Docia Chuck. Henrene Pastor, MD 09/29/2017 5:22:31 PM This report has been signed electronically.

## 2017-09-29 NOTE — Progress Notes (Signed)
Called to room to assist during endoscopic procedure.  Patient ID and intended procedure confirmed with present staff. Received instructions for my participation in the procedure from the performing physician.  

## 2017-09-30 ENCOUNTER — Telehealth: Payer: Self-pay

## 2017-09-30 NOTE — Telephone Encounter (Signed)
  Follow up Call-  Call back number 09/29/2017  Post procedure Call Back phone  # [phone]  Permission to leave phone message Yes  Some recent data might be hidden     Patient questions:  Do you have a fever, pain , or abdominal swelling? No. Pain Score  0 *  Have you tolerated food without any problems? Yes.    Have you been able to return to your normal activities? Yes.    Do you have any questions about your discharge instructions: Diet   No. Medications  No. Follow up visit  No.  Do you have questions or concerns about your Care? No.  Actions: * If pain score is 4 or above: No action needed, pain <4.

## 2017-09-30 NOTE — Telephone Encounter (Signed)
Patient scheduled with Dr. Rhona Raider 10/03/17-per their office Patient aware of appointment.

## 2017-09-30 NOTE — Telephone Encounter (Signed)
Attempted to reach patient for post-procedure f/u call. No answer. Left message that we will attempt to reach her later today and for her to please no hesitate to call us if she has any questions/concerns regarding her care.

## 2017-10-04 ENCOUNTER — Encounter: Payer: Self-pay | Admitting: Internal Medicine

## 2017-10-19 ENCOUNTER — Other Ambulatory Visit: Payer: Self-pay | Admitting: Nurse Practitioner

## 2017-10-19 DIAGNOSIS — K582 Mixed irritable bowel syndrome: Secondary | ICD-10-CM

## 2017-10-19 DIAGNOSIS — R112 Nausea with vomiting, unspecified: Secondary | ICD-10-CM

## 2017-10-21 NOTE — Telephone Encounter (Signed)
She need to request additional refill from GI

## 2017-11-19 ENCOUNTER — Other Ambulatory Visit: Payer: Self-pay | Admitting: Nurse Practitioner

## 2017-11-19 DIAGNOSIS — K5903 Drug induced constipation: Secondary | ICD-10-CM

## 2017-11-19 DIAGNOSIS — K582 Mixed irritable bowel syndrome: Secondary | ICD-10-CM

## 2017-12-02 ENCOUNTER — Telehealth: Payer: Self-pay | Admitting: Internal Medicine

## 2017-12-02 ENCOUNTER — Telehealth: Payer: Self-pay | Admitting: Nurse Practitioner

## 2017-12-02 DIAGNOSIS — K582 Mixed irritable bowel syndrome: Secondary | ICD-10-CM

## 2017-12-02 DIAGNOSIS — R112 Nausea with vomiting, unspecified: Secondary | ICD-10-CM

## 2017-12-02 NOTE — Telephone Encounter (Signed)
Advise pt to call GI doctor for this medication (per [facility]). Pt verbalized understand.

## 2017-12-02 NOTE — Telephone Encounter (Signed)
Copied from [facility] [id number]. Topic: Quick Communication - Rx Refill/Question >> Dec 02, 2017  4:06 PM Oliver Pila B wrote: Medication: promethazine (PHENERGAN) 25 MG tablet [[id number]]  Has the patient contacted their pharmacy? Yes.   (Agent: If no, request that the patient contact the pharmacy for the refill.) Preferred Pharmacy (with phone number or street name): [facility]: Please be advised that RX refills may take up to 3 business days. We ask that you follow-up with your pharmacy.

## 2017-12-05 NOTE — Telephone Encounter (Signed)
Patient requesting refill of Phenergan which was originally prescribed by Dr. Alain Marion on 09/06/2017.  Patient seen in our office for nausea and vomiting and had a colonoscopy on 09/28/2017.  Ok to refill?

## 2017-12-06 MED ORDER — PROMETHAZINE HCL 25 MG PO TABS: 25.0000 mg | ORAL_TABLET | Freq: Three times a day (TID) | ORAL | 0 refills | Status: DC | PRN

## 2017-12-06 NOTE — Telephone Encounter (Signed)
Refilled Phenergan for one month; needs to be filled by Dr. Alain Marion going forward.

## 2017-12-06 NOTE — Telephone Encounter (Signed)
Okay to refill once. She can have her refills with Dr. Alain Marion thereafter

## 2017-12-09 ENCOUNTER — Other Ambulatory Visit: Payer: Self-pay | Admitting: Nurse Practitioner

## 2017-12-09 DIAGNOSIS — K582 Mixed irritable bowel syndrome: Secondary | ICD-10-CM

## 2017-12-09 DIAGNOSIS — K219 Gastro-esophageal reflux disease without esophagitis: Secondary | ICD-10-CM

## 2017-12-13 ENCOUNTER — Ambulatory Visit: Payer: BLUE CROSS/BLUE SHIELD | Admitting: Physical Therapy

## 2017-12-14 ENCOUNTER — Ambulatory Visit: Payer: BLUE CROSS/BLUE SHIELD | Attending: Nurse Practitioner | Admitting: Physical Therapy

## 2017-12-14 ENCOUNTER — Encounter: Payer: Self-pay | Admitting: Physical Therapy

## 2017-12-14 ENCOUNTER — Other Ambulatory Visit: Payer: Self-pay

## 2017-12-14 DIAGNOSIS — R600 Localized edema: Secondary | ICD-10-CM

## 2017-12-14 DIAGNOSIS — M25562 Pain in left knee: Secondary | ICD-10-CM | POA: Diagnosis present

## 2017-12-14 DIAGNOSIS — R262 Difficulty in walking, not elsewhere classified: Secondary | ICD-10-CM | POA: Diagnosis present

## 2017-12-14 DIAGNOSIS — M25662 Stiffness of left knee, not elsewhere classified: Secondary | ICD-10-CM | POA: Diagnosis present

## 2017-12-14 NOTE — Therapy (Signed)
[place] [address] [place], [place], [address] Phone: [phone]   Fax:  [phone]  Physical Therapy Evaluation  Patient Details  Name: Heather Bowman MRN: [id number] Date of Birth: 02/15/1981 Referring Provider: Amil Amen   Encounter Date: 12/14/2017  PT End of Session - 12/14/17 1349    Visit Number  1    Date for PT Re-Evaluation  02/12/18    PT Start Time  1310    PT Stop Time  1400    PT Time Calculation (min)  50 min    Activity Tolerance  Patient tolerated treatment well    Behavior During Therapy  [facility] for tasks assessed/performed       Past Medical History:  Diagnosis Date  . Anxiety   . Arthritis   . Breast mass    right axilla  . Chronic pain syndrome   . Constipation    uses linzess daily   . Depression   . GERD (gastroesophageal reflux disease)   . IBS (irritable bowel syndrome)   . PCOS (polycystic ovarian syndrome)    on metformin for this    Past Surgical History:  Procedure Laterality Date  . COLONOSCOPY    . MYOMECTOMY    . POLYPECTOMY    . right axilla breast mass removal      There were no vitals filed for this visit.   Subjective Assessment - 12/14/17 1318    Subjective  Patient reports that in November she was stepping down out of the back of a truck and injured the left knee.  MRI showed ACL and meniscus tear.  She reports that she had PT for a few weeks to get her ROM back, she underwent a left ACL repair on 11/24/17.  She reports that she was in an immobilizer for two weeks.  She reports that she is unsure of what to do next, she is not wearing a brace today    Limitations  Walking;Lifting;House hold activities    Patient Stated Goals  Run, have normal motions without pain    Currently in Pain?  Yes    Pain Score  6     Pain Location  Knee    Pain Orientation  Left    Pain Descriptors / Indicators  Aching    Pain Type  Acute pain;Surgical pain    Pain Onset  1 to 4  weeks ago    Pain Frequency  Constant    Aggravating Factors   being up on it, bending it, without pain meds pain can be up to 9/10    Pain Relieving Factors  rest, ice and pain meds at best pain is a 4/10    Effect of Pain on Daily Activities  limits everything         [facility] PT Assessment - 12/14/17 0001      Assessment   Medical Diagnosis  s/p left ACL reconstruction    Referring Provider  Dalldor    Onset Date/Surgical Date  11/24/17    Prior Therapy  prior to surgery had PT to gain ROM      Precautions   Precaution Comments  ACL protocol      Balance Screen   Has the patient fallen in the past 6 months  No    Has the patient had a decrease in activity level because of a fear of falling?   No    Is the patient reluctant to leave their home because  of a fear of falling?   No      Home Environment   Additional Comments  has stairs, does housework      Prior Function   Level of Independence  Independent    Vocation  Unemployed    Leisure  was walking some for exercise      Observation/Other Assessments-Edema    Edema  Circumferential      Circumferential Edema   Circumferential - Right  39cm    Circumferential - Left   41 cm      ROM / Strength   AROM / PROM / Strength  AROM;PROM;Strength      AROM   Overall AROM Comments  pain with active motions    AROM Assessment Site  Knee    Right/Left Knee  Left    Left Knee Extension  20    Left Knee Flexion  60      PROM   Overall PROM Comments  pain with passive motions    PROM Assessment Site  Knee    Right/Left Knee  Left    Left Knee Extension  9    Left Knee Flexion  70      Strength   Overall Strength Comments  3+/5 in the range and painful      Flexibility   Soft Tissue Assessment /Muscle Length  yes    Hamstrings  very tight    Quadriceps  very tight    ITB  very tight      Palpation   Palpation comment  mild tenderness in the left knee      Ambulation/Gait   Gait Comments  no device, very stiff,  very poor pattern, hikes leg bends knee maybe 25 degrees, mid foot strike             Objective measurements completed on examination: See above findings.      [facility] Adult PT Treatment/Exercise - 12/14/17 0001      Exercises   Exercises  Knee/Hip      Knee/Hip Exercises: Aerobic   Recumbent Bike  3 minutes partial revolutions    Nustep  level 4 x 5 minutes      Knee/Hip Exercises: Seated   Long Arc Quad  2 sets;10 reps;Left    Heel Slides  2 sets;10 reps;Left             PT Education - 12/14/17 1349    Education provided  Yes    Education Details  went over her gait and encouraged her to exaggerate stepping over something, LLLD flexion and extension stretches    Person(s) Educated  Patient    Methods  Explanation;Demonstration;Handout;Tactile cues;Verbal cues    Comprehension  Verbalized understanding;Returned demonstration       PT Short Term Goals - 12/14/17 1352      PT SHORT TERM GOAL #1   Title  indepednent with initial HEP    Time  2    Period  Weeks    Status  New        PT Long Term Goals - 12/14/17 1352      PT LONG TERM GOAL #1   Title  decrease pain 50%    Time  12    Period  Weeks    Status  New      PT LONG TERM GOAL #2   Title  understand RICE    Time  12    Period  Weeks    Status  New  PT LONG TERM GOAL #3   Title  increase AROM to 0-120 degrees flexion    Time  12    Period  Weeks    Status  New      PT LONG TERM GOAL #4   Title  go up and down stairs step over step    Time  12    Period  Weeks    Status  New      PT LONG TERM GOAL #5   Title  get up from floor wihtout difficulty    Time  12    Period  Weeks    Status  New             Plan - 12/14/17 1350    Clinical Impression Statement  Patient injured her left ACL in November, she underewent a left ACL reconstruction in 11/24/17.  She has poor ROM 20-60 degrees actively, 9-70 degrees passively, she walks stiff legged, no device, tends to hike the  hip    Clinical Presentation  Evolving    Clinical Presentation due to:  recent surgery    Clinical Decision Making  Low    Rehab Potential  Good    PT Frequency  2x / week    PT Duration  8 weeks    PT Treatment/Interventions  ADLs/Self Care Home Management;Cryotherapy;Electrical Stimulation;Gait training;Neuromuscular re-education;Balance training;Therapeutic exercise;Therapeutic activities;Functional mobility training;Stair training;Patient/family education;Manual techniques;Vasopneumatic Device    PT Next Visit Plan  Add exercises as tolerated, work on gait and ROM    Consulted and Agree with Plan of Care  Patient       Patient will benefit from skilled therapeutic intervention in order to improve the following deficits and impairments:  Abnormal gait, Decreased range of motion, Difficulty walking, Increased muscle spasms, Decreased activity tolerance, Pain, Decreased balance, Impaired flexibility, Decreased mobility, Decreased strength, Increased edema  Visit Diagnosis: Acute pain of left knee - Plan: PT plan of care cert/re-cert  Stiffness of left knee, not elsewhere classified - Plan: PT plan of care cert/re-cert  Difficulty in walking, not elsewhere classified - Plan: PT plan of care cert/re-cert  Localized edema - Plan: PT plan of care cert/re-cert     Problem List Patient Active Problem List   Diagnosis Date Noted  . Hemarthrosis 09/08/2017  . Endometrial polyp 12/30/2016  . Constipation 12/03/2016  . Axillary mass, right 12/03/2016  . Painful pinna of both ears on examination 05/14/2016  . Infertility counseling 03/21/2015  . Axillary adenitis 08/07/2013  . Excessive sweating 06/13/2013  . Nausea 01/17/2013  . Dysuria 07/11/2012  . PCOS (polycystic ovarian syndrome) 05/26/2012  . GAD (generalized anxiety disorder) 05/02/2012  . Chronic pain syndrome 05/02/2012  . GERD (gastroesophageal reflux disease) 05/02/2012    Sumner Boast., PT 12/14/2017, 2:01  PM  [address] [place], [place], [address] Phone: [phone]   Fax:  [phone]  Name: Heather Bowman MRN: [id number] Date of Birth: 1981/06/12

## 2017-12-15 ENCOUNTER — Encounter: Payer: Self-pay | Admitting: Physical Therapy

## 2017-12-15 ENCOUNTER — Ambulatory Visit: Payer: BLUE CROSS/BLUE SHIELD | Admitting: Physical Therapy

## 2017-12-15 DIAGNOSIS — R262 Difficulty in walking, not elsewhere classified: Secondary | ICD-10-CM

## 2017-12-15 DIAGNOSIS — M25562 Pain in left knee: Secondary | ICD-10-CM

## 2017-12-15 DIAGNOSIS — M25662 Stiffness of left knee, not elsewhere classified: Secondary | ICD-10-CM

## 2017-12-15 DIAGNOSIS — R600 Localized edema: Secondary | ICD-10-CM

## 2017-12-15 NOTE — Therapy (Signed)
[place], [place], [address] Phone: [phone]   Fax:  [phone]  Physical Therapy Treatment  Patient Details  Name: Ashlynd Michna MRN: [id number] Date of Birth: [birth year] Referring Provider: Amil Amen   Encounter Date: 12/15/2017  PT End of Session - 12/15/17 1429    Visit Number  2    Date for PT Re-Evaluation  02/12/18    PT Start Time  7824    PT Stop Time  1440    PT Time Calculation (min)  51 min    Activity Tolerance  Patient tolerated treatment well    Behavior During Therapy  Anxious;WFL for tasks assessed/performed       Past Medical History:  Diagnosis Date  . Anxiety   . Arthritis   . Breast mass    right axilla  . Chronic pain syndrome   . Constipation    uses linzess daily   . Depression   . GERD (gastroesophageal reflux disease)   . IBS (irritable bowel syndrome)   . PCOS (polycystic ovarian syndrome)    on metformin for this    Past Surgical History:  Procedure Laterality Date  . COLONOSCOPY    . MYOMECTOMY    . POLYPECTOMY    . right axilla breast mass removal      There were no vitals filed for this visit.  Subjective Assessment - 12/15/17 1353    Subjective  Pt reports no change since evaluation    Currently in Pain?  Yes    Pain Score  5     Pain Location  Knee    Pain Orientation  Left                      [facility] Adult PT Treatment/Exercise - 12/15/17 0001      Knee/Hip Exercises: Aerobic   Recumbent Bike  3 minutes partial revolutions    Nustep  level 4 x 5 minutes      Knee/Hip Exercises: Machines for Strengthening   Cybex Leg Press  20lb 2x10 40-0 deg      Knee/Hip Exercises: Supine   Quad Sets  Left;2 sets;10 reps    Short Arc Quad Sets  Left;2 sets;10 reps    Heel Slides  Left;2 sets;15 reps;Limitations    Straight Leg Raises  2 sets;Left;10 reps      Modalities   Modalities  Cryotherapy      Cryotherapy   Number  Minutes Cryotherapy  10 Minutes    Cryotherapy Location  Knee    Type of Cryotherapy  Ice pack      Manual Therapy   Manual Therapy  Passive ROM    Passive ROM  L knee flex and ext             PT Education - 12/14/17 1349    Education provided  Yes    Education Details  went over her gait and encouraged her to exaggerate stepping over something, LLLD flexion and extension stretches    Person(s) Educated  Patient    Methods  Explanation;Demonstration;Handout;Tactile cues;Verbal cues    Comprehension  Verbalized understanding;Returned demonstration       PT Short Term Goals - 12/14/17 1352      PT SHORT TERM GOAL #1   Title  indepednent with initial HEP    Time  2    Period  Weeks    Status  New  PT Long Term Goals - 12/14/17 1352      PT LONG TERM GOAL #1   Title  decrease pain 50%    Time  12    Period  Weeks    Status  New      PT LONG TERM GOAL #2   Title  understand RICE    Time  12    Period  Weeks    Status  New      PT LONG TERM GOAL #3   Title  increase AROM to 0-120 degrees flexion    Time  12    Period  Weeks    Status  New      PT LONG TERM GOAL #4   Title  go up and down stairs step over step    Time  12    Period  Weeks    Status  New      PT LONG TERM GOAL #5   Title  get up from floor wihtout difficulty    Time  12    Period  Weeks    Status  New            Plan - 12/15/17 1430    Clinical Impression Statement  Stressed the importance of her putting ice on her knee and doing her HEP. Pt appears to not tolerate pain well, pain reported with PROM. Cues given for proper exercise execution. Pt continues to walk stiff legged with no device     PT Treatment/Interventions  ADLs/Self Care Home Management;Cryotherapy;Electrical Stimulation;Gait training;Neuromuscular re-education;Balance training;Therapeutic exercise;Therapeutic activities;Functional mobility training;Stair training;Patient/family education;Manual  techniques;Vasopneumatic Device    PT Next Visit Plan  Add exercises as tolerated, work on gait and ROM       Patient will benefit from skilled therapeutic intervention in order to improve the following deficits and impairments:  Abnormal gait, Decreased range of motion, Difficulty walking, Increased muscle spasms, Decreased activity tolerance, Pain, Decreased balance, Impaired flexibility, Decreased mobility, Decreased strength, Increased edema  Visit Diagnosis: Stiffness of left knee, not elsewhere classified  Acute pain of left knee  Localized edema  Difficulty in walking, not elsewhere classified     Problem List Patient Active Problem List   Diagnosis Date Noted  . Hemarthrosis 09/08/2017  . Endometrial polyp 12/30/2016  . Constipation 12/03/2016  . Axillary mass, right 12/03/2016  . Painful pinna of both ears on examination 05/14/2016  . Infertility counseling 03/21/2015  . Axillary adenitis 08/07/2013  . Excessive sweating 06/13/2013  . Nausea 01/17/2013  . Dysuria 07/11/2012  . PCOS (polycystic ovarian syndrome) 05/26/2012  . GAD (generalized anxiety disorder) 05/02/2012  . Chronic pain syndrome 05/02/2012  . GERD (gastroesophageal reflux disease) 05/02/2012    Scot Jun, PTA 12/15/2017, 2:33 PM  [place] [address] [place], [place], [address] Phone: [phone]   Fax:  [phone]  Name: Leonore Frankson MRN: [id number] Date of Birth: 05-15-1981

## 2017-12-20 ENCOUNTER — Ambulatory Visit: Payer: BLUE CROSS/BLUE SHIELD | Admitting: Physical Therapy

## 2017-12-20 ENCOUNTER — Encounter: Payer: Self-pay | Admitting: Physical Therapy

## 2017-12-20 DIAGNOSIS — M25562 Pain in left knee: Secondary | ICD-10-CM

## 2017-12-20 DIAGNOSIS — M25662 Stiffness of left knee, not elsewhere classified: Secondary | ICD-10-CM

## 2017-12-20 DIAGNOSIS — R600 Localized edema: Secondary | ICD-10-CM

## 2017-12-20 NOTE — Therapy (Signed)
[place] [address] [place], [place], [address] Phone: [phone]   Fax:  [phone]  Physical Therapy Treatment  Patient Details  Name: Heather Bowman MRN: [id number] Date of Birth: Jan 26, 1981 Referring Provider: Amil Amen   Encounter Date: 12/20/2017  PT End of Session - 12/20/17 1519    PT Start Time  1435    PT Stop Time  1530    PT Time Calculation (min)  55 min    Activity Tolerance  Patient tolerated treatment well    Behavior During Therapy  Anxious;WFL for tasks assessed/performed       Past Medical History:  Diagnosis Date  . Anxiety   . Arthritis   . Breast mass    right axilla  . Chronic pain syndrome   . Constipation    uses linzess daily   . Depression   . GERD (gastroesophageal reflux disease)   . IBS (irritable bowel syndrome)   . PCOS (polycystic ovarian syndrome)    on metformin for this    Past Surgical History:  Procedure Laterality Date  . COLONOSCOPY    . MYOMECTOMY    . POLYPECTOMY    . right axilla breast mass removal      There were no vitals filed for this visit.  Subjective Assessment - 12/20/17 1439    Subjective  Pt reports that she has not been doing her HEP. "It has been hectic"    Currently in Pain?  Yes    Pain Score  6     Pain Location  Knee    Pain Orientation  Left                      [facility] Adult PT Treatment/Exercise - 12/20/17 0001      Ambulation/Gait   Gait Comments  worked on gait LLE on treadmill working on heel strike hip and knee frlex and toe off.      Knee/Hip Exercises: Aerobic   Recumbent Bike  3 minutes seat 6    Nustep  level 2 x 6 minutes      Knee/Hip Exercises: Machines for Strengthening   Cybex Leg Press  20lb 3x10 40-0 deg; TKE LLE 20lb 2x10       Knee/Hip Exercises: Standing   Hip Flexion  Left;2 sets;Knee bent;15 reps    Lateral Step Up  Left;2 sets;10 reps;Hand Hold: 0;Step Height: 6"    Forward Step Up  2  sets;Left;10 reps;Hand Hold: 0;Step Height: 6"      Knee/Hip Exercises: Seated   Sit to Sand  2 sets;10 reps;without UE support 6 in box on mat table       Modalities   Modalities  Vasopneumatic      Vasopneumatic   Number Minutes Vasopneumatic   15 minutes    Vasopnuematic Location   Knee    Vasopneumatic Pressure  Medium    Vasopneumatic Temperature   32      Manual Therapy   Manual Therapy  Passive ROM    Passive ROM  L knee flex and ext               PT Short Term Goals - 12/14/17 1352      PT SHORT TERM GOAL #1   Title  indepednent with initial HEP    Time  2    Period  Weeks    Status  New        PT Long  Term Goals - 12/14/17 1352      PT LONG TERM GOAL #1   Title  decrease pain 50%    Time  12    Period  Weeks    Status  New      PT LONG TERM GOAL #2   Title  understand RICE    Time  12    Period  Weeks    Status  New      PT LONG TERM GOAL #3   Title  increase AROM to 0-120 degrees flexion    Time  12    Period  Weeks    Status  New      PT LONG TERM GOAL #4   Title  go up and down stairs step over step    Time  12    Period  Weeks    Status  New      PT LONG TERM GOAL #5   Title  get up from floor wihtout difficulty    Time  12    Period  Weeks    Status  New            Plan - 12/20/17 1520    Clinical Impression Statement  Again informed pt the importance on doing her HEP. She does well in therapy today. Pt able to complete full revolutions on recumbent bike. Progressed to com more functional interventions. Verbal and tactile cues needed to prevent hip compensation with step up activities.    Rehab Potential  Good    PT Frequency  2x / week    PT Duration  8 weeks    PT Treatment/Interventions  ADLs/Self Care Home Management;Cryotherapy;Electrical Stimulation;Gait training;Neuromuscular re-education;Balance training;Therapeutic exercise;Therapeutic activities;Functional mobility training;Stair training;Patient/family  education;Manual techniques;Vasopneumatic Device    PT Next Visit Plan  Add exercises as tolerated, work on gait and ROM       Patient will benefit from skilled therapeutic intervention in order to improve the following deficits and impairments:  Abnormal gait, Decreased range of motion, Difficulty walking, Increased muscle spasms, Decreased activity tolerance, Pain, Decreased balance, Impaired flexibility, Decreased mobility, Decreased strength, Increased edema  Visit Diagnosis: Acute pain of left knee  Stiffness of left knee, not elsewhere classified  Localized edema     Problem List Patient Active Problem List   Diagnosis Date Noted  . Hemarthrosis 09/08/2017  . Endometrial polyp 12/30/2016  . Constipation 12/03/2016  . Axillary mass, right 12/03/2016  . Painful pinna of both ears on examination 05/14/2016  . Infertility counseling 03/21/2015  . Axillary adenitis 08/07/2013  . Excessive sweating 06/13/2013  . Nausea 01/17/2013  . Dysuria 07/11/2012  . PCOS (polycystic ovarian syndrome) 05/26/2012  . GAD (generalized anxiety disorder) 05/02/2012  . Chronic pain syndrome 05/02/2012  . GERD (gastroesophageal reflux disease) 05/02/2012    Scot Jun, PTA 12/20/2017, 3:22 PM  [place] [address] [place], [place], [address] Phone: [phone]   Fax:  [phone]  Name: Heather Bowman MRN: [id number] Date of Birth: May 31, 1981

## 2017-12-22 ENCOUNTER — Ambulatory Visit: Payer: BLUE CROSS/BLUE SHIELD | Admitting: Physical Therapy

## 2017-12-22 DIAGNOSIS — R262 Difficulty in walking, not elsewhere classified: Secondary | ICD-10-CM

## 2017-12-22 DIAGNOSIS — M25662 Stiffness of left knee, not elsewhere classified: Secondary | ICD-10-CM

## 2017-12-22 DIAGNOSIS — M25562 Pain in left knee: Secondary | ICD-10-CM

## 2017-12-22 DIAGNOSIS — R600 Localized edema: Secondary | ICD-10-CM

## 2017-12-22 NOTE — Therapy (Signed)
[place] [address] [place], [place], [address] Phone: [phone]   Fax:  [phone]  Physical Therapy Treatment  Patient Details  Name: Heather Bowman MRN: [id number] Date of Birth: 1981/10/26 Referring Provider: Amil Amen   Encounter Date: 12/22/2017  PT End of Session - 12/22/17 1620    Visit Number  3    Date for PT Re-Evaluation  02/12/18    PT Start Time  0277    PT Stop Time  1630    PT Time Calculation (min)  55 min       Past Medical History:  Diagnosis Date  . Anxiety   . Arthritis   . Breast mass    right axilla  . Chronic pain syndrome   . Constipation    uses linzess daily   . Depression   . GERD (gastroesophageal reflux disease)   . IBS (irritable bowel syndrome)   . PCOS (polycystic ovarian syndrome)    on metformin for this    Past Surgical History:  Procedure Laterality Date  . COLONOSCOPY    . MYOMECTOMY    . POLYPECTOMY    . right axilla breast mass removal      There were no vitals filed for this visit.  Subjective Assessment - 12/22/17 1540    Subjective  amb in on left toes and slight knee flexion. doing HEP more consistant    Currently in Pain?  Yes    Pain Score  5     Pain Location  Knee    Pain Orientation  Left         [facility] PT Assessment - 12/22/17 0001      AROM   AROM Assessment Site  Knee    Right/Left Knee  Left    Left Knee Extension  4    Left Knee Flexion  95                  [facility] Adult PT Treatment/Exercise - 12/22/17 0001      Ambulation/Gait   Gait Comments  worked on gait to decrease compensations      Knee/Hip Exercises: Aerobic   Recumbent Bike  5 minutes seat 5    Nustep  L 4 6 min      Knee/Hip Exercises: Machines for Strengthening   Cybex Leg Press  30#  3x10 40-0 deg;       Knee/Hip Exercises: Standing   Lateral Step Up  Left;2 sets;10 reps;Step Height: 6";Hand Hold: 1    Forward Step Up  2 sets;Left;10 reps;Step  Height: 6";Hand Hold: 1    Other Standing Knee Exercises  yellow tband hip flex,ext and abd 15 each    Other Standing Knee Exercises  yellow tband TKE 10 reps with cuing to activate quad      Knee/Hip Exercises: Seated   Long Arc Quad  2 sets;10 reps;Left yellow tband    Hamstring Curl  Strengthening;Left;2 sets;10 reps yellow tband      Knee/Hip Exercises: Supine   Straight Leg Raises  Strengthening;Left;2 sets;10 reps yellow tband    Other Supine Knee/Hip Exercises  bridge with ball 2 sets 10      Knee/Hip Exercises: Sidelying   Hip ABduction  Strengthening;Left;2 sets;10 reps yellow      Vasopneumatic   Number Minutes Vasopneumatic   15 minutes    Vasopnuematic Location   Knee    Vasopneumatic Pressure  Medium    Vasopneumatic Temperature  32               PT Short Term Goals - 12/22/17 1618      PT SHORT TERM GOAL #1   Title  indepednent with initial HEP    Status  Achieved        PT Long Term Goals - 12/22/17 1618      PT LONG TERM GOAL #1   Title  decrease pain 50%    Status  Partially Met      PT LONG TERM GOAL #2   Title  understand RICE    Status  Achieved      PT LONG TERM GOAL #3   Title  increase AROM to 0-120 degrees flexion    Status  Partially Met      PT LONG TERM GOAL #4   Title  go up and down stairs step over step    Status  On-going      PT LONG TERM GOAL #5   Title  get up from floor wihtout difficulty    Status  On-going            Plan - 12/22/17 1620    Clinical Impression Statement  improved ROM and gait . cuing with gait to decrease compensations and toactivate quads. progressing with goals    PT Treatment/Interventions  ADLs/Self Care Home Management;Cryotherapy;Electrical Stimulation;Gait training;Neuromuscular re-education;Balance training;Therapeutic exercise;Therapeutic activities;Functional mobility training;Stair training;Patient/family education;Manual techniques;Vasopneumatic Device    PT Next Visit Plan  quad  strength,ROM,gait       Patient will benefit from skilled therapeutic intervention in order to improve the following deficits and impairments:  Abnormal gait, Decreased range of motion, Difficulty walking, Increased muscle spasms, Decreased activity tolerance, Pain, Decreased balance, Impaired flexibility, Decreased mobility, Decreased strength, Increased edema  Visit Diagnosis: Acute pain of left knee  Stiffness of left knee, not elsewhere classified  Localized edema  Difficulty in walking, not elsewhere classified     Problem List Patient Active Problem List   Diagnosis Date Noted  . Hemarthrosis 09/08/2017  . Endometrial polyp 12/30/2016  . Constipation 12/03/2016  . Axillary mass, right 12/03/2016  . Painful pinna of both ears on examination 05/14/2016  . Infertility counseling 03/21/2015  . Axillary adenitis 08/07/2013  . Excessive sweating 06/13/2013  . Nausea 01/17/2013  . Dysuria 07/11/2012  . PCOS (polycystic ovarian syndrome) 05/26/2012  . GAD (generalized anxiety disorder) 05/02/2012  . Chronic pain syndrome 05/02/2012  . GERD (gastroesophageal reflux disease) 05/02/2012    PAYSEUR,ANGIE PTA 12/22/2017, 4:25 PM  [address] [place], [place], [address] Phone: [phone]   Fax:  [phone]  Name: Heather Bowman MRN: [id number] Date of Birth: 07/27/1981

## 2017-12-28 ENCOUNTER — Ambulatory Visit: Payer: BLUE CROSS/BLUE SHIELD | Admitting: Physical Therapy

## 2017-12-28 ENCOUNTER — Encounter: Payer: Self-pay | Admitting: Physical Therapy

## 2017-12-28 DIAGNOSIS — M25662 Stiffness of left knee, not elsewhere classified: Secondary | ICD-10-CM

## 2017-12-28 DIAGNOSIS — M25562 Pain in left knee: Secondary | ICD-10-CM | POA: Diagnosis not present

## 2017-12-28 DIAGNOSIS — R262 Difficulty in walking, not elsewhere classified: Secondary | ICD-10-CM

## 2017-12-28 DIAGNOSIS — R600 Localized edema: Secondary | ICD-10-CM

## 2017-12-28 NOTE — Therapy (Signed)
[facility], [place], [address] Phone: [phone]   Fax:  [phone]  Physical Therapy Treatment  Patient Details  Name: Heather Bowman MRN: [id number] Date of Birth: August 16, 1981 Referring Provider: Amil Amen   Encounter Date: 12/28/2017  PT End of Session - 12/28/17 1513    Visit Number  4    Date for PT Re-Evaluation  02/12/18    PT Start Time  1436    PT Stop Time  1530    PT Time Calculation (min)  54 min    Activity Tolerance  Patient tolerated treatment well    Behavior During Therapy  Anxious;WFL for tasks assessed/performed       Past Medical History:  Diagnosis Date  . Anxiety   . Arthritis   . Breast mass    right axilla  . Chronic pain syndrome   . Constipation    uses linzess daily   . Depression   . GERD (gastroesophageal reflux disease)   . IBS (irritable bowel syndrome)   . PCOS (polycystic ovarian syndrome)    on metformin for this    Past Surgical History:  Procedure Laterality Date  . COLONOSCOPY    . MYOMECTOMY    . POLYPECTOMY    . right axilla breast mass removal      There were no vitals filed for this visit.  Subjective Assessment - 12/28/17 1439    Subjective  amb in rigid LLE little hip and knee flex. Reports " it gets finicky with this weather"     Currently in Pain?  Yes    Pain Score  5     Pain Location  Knee    Pain Orientation  Left                      [facility] Adult PT Treatment/Exercise - 12/28/17 0001      Knee/Hip Exercises: Aerobic   Recumbent Bike  4 minutes seat 5 L1     Nustep  L 4 6 min      Knee/Hip Exercises: Machines for Strengthening   Cybex Leg Press  LLE TKE 3x10       Knee/Hip Exercises: Standing   Hip Flexion  Left;2 sets;Knee bent;15 reps    Lateral Step Up  Left;2 sets;10 reps;Step Height: 6";Hand Hold: 1    Forward Step Up  2 sets;Left;10 reps;Step Height: 6";Hand Hold: 1    Step Down  -- TKE resistance  arounf LLE    Other Standing Knee Exercises  4in box step up and pand overs      Knee/Hip Exercises: Seated   Hamstring Curl  Strengthening;Left;2 sets;10 reps    Hamstring Limitations  green tband      Vasopneumatic   Number Minutes Vasopneumatic   15 minutes    Vasopnuematic Location   Knee    Vasopneumatic Pressure  Medium    Vasopneumatic Temperature   32      Manual Therapy   Manual Therapy  Passive ROM    Manual therapy comments  difficulty relaxing    Passive ROM  L knee flex and ext               PT Short Term Goals - 12/22/17 1618      PT SHORT TERM GOAL #1   Title  indepednent with initial HEP    Status  Achieved        PT Long Term Goals -  12/22/17 1618      PT LONG TERM GOAL #1   Title  decrease pain 50%    Status  Partially Met      PT LONG TERM GOAL #2   Title  understand RICE    Status  Achieved      PT LONG TERM GOAL #3   Title  increase AROM to 0-120 degrees flexion    Status  Partially Met      PT LONG TERM GOAL #4   Title  go up and down stairs step over step    Status  On-going      PT LONG TERM GOAL #5   Title  get up from floor wihtout difficulty    Status  On-going            Plan - 12/28/17 1517    Clinical Impression Statement  Constant cues provided through out session to prevent compensation with gait. Pt able to correct and reports "I need to think about it" L knee lacks full ext, pt very guarded with MT. Pt does better with interventions that force knee flexion.    Rehab Potential  Good    PT Frequency  2x / week    PT Duration  8 weeks    PT Treatment/Interventions  ADLs/Self Care Home Management;Cryotherapy;Electrical Stimulation;Gait training;Neuromuscular re-education;Balance training;Therapeutic exercise;Therapeutic activities;Functional mobility training;Stair training;Patient/family education;Manual techniques;Vasopneumatic Device    PT Next Visit Plan  quad strength,ROM,gait       Patient will benefit  from skilled therapeutic intervention in order to improve the following deficits and impairments:  Abnormal gait, Decreased range of motion, Difficulty walking, Increased muscle spasms, Decreased activity tolerance, Pain, Decreased balance, Impaired flexibility, Decreased mobility, Decreased strength, Increased edema  Visit Diagnosis: Stiffness of left knee, not elsewhere classified  Acute pain of left knee  Localized edema  Difficulty in walking, not elsewhere classified     Problem List Patient Active Problem List   Diagnosis Date Noted  . Hemarthrosis 09/08/2017  . Endometrial polyp 12/30/2016  . Constipation 12/03/2016  . Axillary mass, right 12/03/2016  . Painful pinna of both ears on examination 05/14/2016  . Infertility counseling 03/21/2015  . Axillary adenitis 08/07/2013  . Excessive sweating 06/13/2013  . Nausea 01/17/2013  . Dysuria 07/11/2012  . PCOS (polycystic ovarian syndrome) 05/26/2012  . GAD (generalized anxiety disorder) 05/02/2012  . Chronic pain syndrome 05/02/2012  . GERD (gastroesophageal reflux disease) 05/02/2012    Scot Jun, PTA 12/28/2017, 3:20 PM  [facility] [address] [place], [place], [address] Phone: [phone]   Fax:  [phone]  Name: Heather Bowman MRN: [id number] Date of Birth: [birth year]

## 2018-01-03 ENCOUNTER — Ambulatory Visit: Payer: BLUE CROSS/BLUE SHIELD | Admitting: Physical Therapy

## 2018-01-05 ENCOUNTER — Ambulatory Visit: Payer: BLUE CROSS/BLUE SHIELD | Admitting: Physical Therapy

## 2018-01-06 ENCOUNTER — Ambulatory Visit: Payer: BLUE CROSS/BLUE SHIELD | Admitting: Physical Therapy

## 2018-01-09 ENCOUNTER — Ambulatory Visit: Payer: BLUE CROSS/BLUE SHIELD | Admitting: Physical Therapy

## 2018-01-11 ENCOUNTER — Encounter: Payer: Self-pay | Admitting: Physical Therapy

## 2018-01-11 ENCOUNTER — Ambulatory Visit: Payer: BLUE CROSS/BLUE SHIELD | Attending: Nurse Practitioner | Admitting: Physical Therapy

## 2018-01-11 DIAGNOSIS — R600 Localized edema: Secondary | ICD-10-CM | POA: Diagnosis present

## 2018-01-11 DIAGNOSIS — R262 Difficulty in walking, not elsewhere classified: Secondary | ICD-10-CM | POA: Insufficient documentation

## 2018-01-11 DIAGNOSIS — M25662 Stiffness of left knee, not elsewhere classified: Secondary | ICD-10-CM

## 2018-01-11 DIAGNOSIS — M25562 Pain in left knee: Secondary | ICD-10-CM | POA: Diagnosis not present

## 2018-01-11 NOTE — Therapy (Signed)
[facility], [place], [address] Phone: [phone]   Fax:  [phone]  Physical Therapy Treatment  Patient Details  Name: Heather Bowman MRN: [id number] Date of Birth: 1981/06/24 Referring Provider: Amil Amen   Encounter Date: 01/11/2018  PT End of Session - 01/11/18 1608    Visit Number  5    Date for PT Re-Evaluation  02/12/18    PT Start Time  5456    PT Stop Time  1612    PT Time Calculation (min)  57 min    Activity Tolerance  Patient tolerated treatment well    Behavior During Therapy  Anxious;WFL for tasks assessed/performed       Past Medical History:  Diagnosis Date  . Anxiety   . Arthritis   . Breast mass    right axilla  . Chronic pain syndrome   . Constipation    uses linzess daily   . Depression   . GERD (gastroesophageal reflux disease)   . IBS (irritable bowel syndrome)   . PCOS (polycystic ovarian syndrome)    on metformin for this    Past Surgical History:  Procedure Laterality Date  . COLONOSCOPY    . MYOMECTOMY    . POLYPECTOMY    . right axilla breast mass removal      There were no vitals filed for this visit.  Subjective Assessment - 01/11/18 1511    Subjective  Pt reports that she is doing good    Currently in Pain?  Yes    Pain Score  5          [facility] PT Assessment - 01/11/18 0001      AROM   AROM Assessment Site  Knee    Right/Left Knee  Left    Left Knee Extension  4    Left Knee Flexion  109                  [facility] Adult PT Treatment/Exercise - 01/11/18 0001      Ambulation/Gait   Gait Comments  3 flights of stairs alt pattern one rail L       Knee/Hip Exercises: Aerobic   Recumbent Bike  L 1 x 6 min       Knee/Hip Exercises: Machines for Strengthening   Cybex Leg Press  30lb 2x10; LLE only TKE 20lb 2x10       Knee/Hip Exercises: Standing   Heel Raises  Both;2 sets;15 reps;2 seconds    Hip Flexion  Left;2 sets;Knee bent;15  reps 2lb    Hip Abduction  Left;2 sets;10 reps;Knee straight 2lb    Lateral Step Up  Left;2 sets;10 reps;Step Height: 6";Hand Hold: 0      Knee/Hip Exercises: Seated   Long Arc Quad  2 sets;10 reps;Left    Long Arc Quad Weight  2 lbs.    Hamstring Curl  Strengthening;Left;2 sets;15 reps    Hamstring Limitations  green tband      Knee/Hip Exercises: Supine   Quad Sets  Left;10 reps;3 sets      Vasopneumatic   Number Minutes Vasopneumatic   15 minutes    Vasopnuematic Location   Knee    Vasopneumatic Pressure  Medium    Vasopneumatic Temperature   32      Manual Therapy   Manual Therapy  Passive ROM    Manual therapy comments  difficulty relaxing    Passive ROM  L knee flex and ext  PT Short Term Goals - 12/22/17 1618      PT SHORT TERM GOAL #1   Title  indepednent with initial HEP    Status  Achieved        PT Long Term Goals - 12/22/17 1618      PT LONG TERM GOAL #1   Title  decrease pain 50%    Status  Partially Met      PT LONG TERM GOAL #2   Title  understand RICE    Status  Achieved      PT LONG TERM GOAL #3   Title  increase AROM to 0-120 degrees flexion    Status  Partially Met      PT LONG TERM GOAL #4   Title  go up and down stairs step over step    Status  On-going      PT LONG TERM GOAL #5   Title  get up from floor wihtout difficulty    Status  On-going            Plan - 01/11/18 1612    Clinical Impression Statement  Pt returns to therapy after two weeks. Pt with an improved gait  but does requires some cues to increase hip flexion. Pt a small improvement in L knee AROM. She does reports some pain at end ranges during MT. Pt with some hip compensation with 6 inch step up. Eccentric load weakness of L quad noted when descending stairs.    Rehab Potential  Good    PT Frequency  2x / week    PT Duration  8 weeks    PT Treatment/Interventions  ADLs/Self Care Home Management;Cryotherapy;Electrical Stimulation;Gait  training;Neuromuscular re-education;Balance training;Therapeutic exercise;Therapeutic activities;Functional mobility training;Stair training;Patient/family education;Manual techniques;Vasopneumatic Device    PT Next Visit Plan  quad strength,ROM,gait       Patient will benefit from skilled therapeutic intervention in order to improve the following deficits and impairments:  Abnormal gait, Decreased range of motion, Difficulty walking, Increased muscle spasms, Decreased activity tolerance, Pain, Decreased balance, Impaired flexibility, Decreased mobility, Decreased strength, Increased edema  Visit Diagnosis: Acute pain of left knee  Localized edema  Stiffness of left knee, not elsewhere classified  Difficulty in walking, not elsewhere classified     Problem List Patient Active Problem List   Diagnosis Date Noted  . Hemarthrosis 09/08/2017  . Endometrial polyp 12/30/2016  . Constipation 12/03/2016  . Axillary mass, right 12/03/2016  . Painful pinna of both ears on examination 05/14/2016  . Infertility counseling 03/21/2015  . Axillary adenitis 08/07/2013  . Excessive sweating 06/13/2013  . Nausea 01/17/2013  . Dysuria 07/11/2012  . PCOS (polycystic ovarian syndrome) 05/26/2012  . GAD (generalized anxiety disorder) 05/02/2012  . Chronic pain syndrome 05/02/2012  . GERD (gastroesophageal reflux disease) 05/02/2012    Scot Jun, PTA 01/11/2018, 4:22 PM  [facility] [address] [place], [place], [address] Phone: [phone]   Fax:  [phone]  Name: Heather Bowman MRN: [id number] Date of Birth: 09/29/1981

## 2018-01-13 ENCOUNTER — Encounter: Payer: Self-pay | Admitting: Physical Therapy

## 2018-01-13 ENCOUNTER — Ambulatory Visit: Payer: BLUE CROSS/BLUE SHIELD | Admitting: Physical Therapy

## 2018-01-13 DIAGNOSIS — R600 Localized edema: Secondary | ICD-10-CM

## 2018-01-13 DIAGNOSIS — M25562 Pain in left knee: Secondary | ICD-10-CM

## 2018-01-13 DIAGNOSIS — R262 Difficulty in walking, not elsewhere classified: Secondary | ICD-10-CM

## 2018-01-13 DIAGNOSIS — M25662 Stiffness of left knee, not elsewhere classified: Secondary | ICD-10-CM

## 2018-01-13 NOTE — Therapy (Signed)
[facility], [place], [address] Phone: [phone]   Fax:  [phone]  Physical Therapy Treatment  Patient Details  Name: Heather Bowman MRN: [id number] Date of Birth: Jun 18, 1981 Referring Provider: Amil Amen   Encounter Date: 01/13/2018  PT End of Session - 01/13/18 1146    Visit Number  6    Date for PT Re-Evaluation  02/12/18    PT Start Time  1100    PT Stop Time  1155    PT Time Calculation (min)  55 min    Activity Tolerance  Patient tolerated treatment well    Behavior During Therapy  Anxious;WFL for tasks assessed/performed       Past Medical History:  Diagnosis Date   Anxiety    Arthritis    Breast mass    right axilla   Chronic pain syndrome    Constipation    uses linzess daily    Depression    GERD (gastroesophageal reflux disease)    IBS (irritable bowel syndrome)    PCOS (polycystic ovarian syndrome)    on metformin for this    Past Surgical History:  Procedure Laterality Date   COLONOSCOPY     MYOMECTOMY     POLYPECTOMY     right axilla breast mass removal      There were no vitals filed for this visit.  Subjective Assessment - 01/13/18 1106    Subjective  Reports popliteal space and patella pain.  Cont with difficulty negotiating stairs reciprocally, particularly on descent    Pain Score  5     Pain Location  Knee    Pain Orientation  Left    Pain Descriptors / Indicators  Aching                      [facility] Adult PT Treatment/Exercise - 01/13/18 0001      Ambulation/Gait   Gait Comments  3 flights of stairs alt pattern one rail L       Knee/Hip Exercises: Aerobic   Tread Mill  5%grade 1.2MPH    Recumbent Bike  L 1 x 6 min       Knee/Hip Exercises: Standing   Terminal Knee Extension  Strengthening;Left;2 sets;10 reps    Hip ADduction  10 reps    Forward Step Up  Left;2 sets;10 reps;Step Height: 8"    Step Down  Left;2 sets;10  reps;Step Height: 6"    Functional Squat  2 sets;10 reps    SLS  5"x10 on mini tramp    Other Standing Knee Exercises  mini tramp SLS 5"x10      Cryotherapy   Number Minutes Cryotherapy  15 Minutes    Cryotherapy Location  Knee    Type of Cryotherapy  Ice pack               PT Short Term Goals - 12/22/17 1618      PT SHORT TERM GOAL #1   Title  indepednent with initial HEP    Status  Achieved        PT Long Term Goals - 12/22/17 1618      PT LONG TERM GOAL #1   Title  decrease pain 50%    Status  Partially Met      PT LONG TERM GOAL #2   Title  understand RICE    Status  Achieved      PT LONG TERM GOAL #3  Title  increase AROM to 0-120 degrees flexion    Status  Partially Met      PT LONG TERM GOAL #4   Title  go up and down stairs step over step    Status  On-going      PT LONG TERM GOAL #5   Title  get up from floor wihtout difficulty    Status  On-going            Plan - 01/13/18 1147    Clinical Impression Statement  Cont to lack TKE during heel strike and mid stance of gait.  Also with decreased knee flexion at toe off of gait. Left hip and quad weakness persist with CKC PREs, particularly during single leg       Patient will benefit from skilled therapeutic intervention in order to improve the following deficits and impairments:     Visit Diagnosis: Acute pain of left knee  Localized edema  Stiffness of left knee, not elsewhere classified  Difficulty in walking, not elsewhere classified     Problem List Patient Active Problem List   Diagnosis Date Noted   Hemarthrosis 09/08/2017   Endometrial polyp 12/30/2016   Constipation 12/03/2016   Axillary mass, right 12/03/2016   Painful pinna of both ears on examination 05/14/2016   Infertility counseling 03/21/2015   Axillary adenitis 08/07/2013   Excessive sweating 06/13/2013   Nausea 01/17/2013   Dysuria 07/11/2012   PCOS (polycystic ovarian syndrome) 05/26/2012    GAD (generalized anxiety disorder) 05/02/2012   Chronic pain syndrome 05/02/2012   GERD (gastroesophageal reflux disease) 05/02/2012    Olean Ree, PTA 01/13/2018, 11:50 AM  [place] [address] [place], [place], [address] Phone: [phone]   Fax:  [phone]  Name: Heather Bowman MRN: [id number] Date of Birth: Sep 20, 1981

## 2018-01-16 ENCOUNTER — Ambulatory Visit: Payer: BLUE CROSS/BLUE SHIELD | Admitting: Physical Therapy

## 2018-01-16 DIAGNOSIS — M25562 Pain in left knee: Secondary | ICD-10-CM | POA: Diagnosis not present

## 2018-01-16 DIAGNOSIS — R262 Difficulty in walking, not elsewhere classified: Secondary | ICD-10-CM

## 2018-01-16 DIAGNOSIS — M25662 Stiffness of left knee, not elsewhere classified: Secondary | ICD-10-CM

## 2018-01-16 DIAGNOSIS — R600 Localized edema: Secondary | ICD-10-CM

## 2018-01-16 NOTE — Therapy (Signed)
[place], [place], [address] Phone: [phone]   Fax:  [phone]  Physical Therapy Treatment  Patient Details  Name: Heather Bowman MRN: [id number] Date of Birth: 03-Mar-1981 Referring Provider: Amil Amen   Encounter Date: 01/16/2018  PT End of Session - 01/16/18 1542    Visit Number  7    Date for PT Re-Evaluation  02/12/18    PT Start Time  4765    PT Stop Time  1613    PT Time Calculation (min)  38 min    Activity Tolerance  Patient tolerated treatment well    Behavior During Therapy  Anxious;WFL for tasks assessed/performed       Past Medical History:  Diagnosis Date  . Anxiety   . Arthritis   . Breast mass    right axilla  . Chronic pain syndrome   . Constipation    uses linzess daily   . Depression   . GERD (gastroesophageal reflux disease)   . IBS (irritable bowel syndrome)   . PCOS (polycystic ovarian syndrome)    on metformin for this    Past Surgical History:  Procedure Laterality Date  . COLONOSCOPY    . MYOMECTOMY    . POLYPECTOMY    . right axilla breast mass removal      There were no vitals filed for this visit.  Subjective Assessment - 01/16/18 1540    Subjective  Pt. doing well today with no new complaints.      Patient Stated Goals  Run, have normal motions without pain    Currently in Pain?  No/denies    Pain Score  5     Pain Location  Knee    Pain Descriptors / Indicators  Aching    Pain Type  Acute pain;Surgical pain    Pain Onset  More than a month ago    Pain Frequency  Constant    Multiple Pain Sites  No                      [facility] Adult PT Treatment/Exercise - 01/16/18 1545      Knee/Hip Exercises: Stretches   Sports administrator  2 reps;Left;30 seconds    Quad Stretch Limitations  prone with strap and bolster under thigh       Knee/Hip Exercises: Aerobic   Recumbent Bike  L 1 x 6 min       Knee/Hip Exercises: Standing   Heel  Raises  Both;2 sets;15 reps    Forward Step Up  Left;10 reps;Step Height: 8";1 set    Wall Squat  10 reps;3 seconds    Other Standing Knee Exercises  Alternating forward/backward wt. shift with L LE forward and blue TB TKE 3" x 10 reps       Knee/Hip Exercises: Seated   Hamstring Curl  Strengthening;Left;2 sets;15 reps    Hamstring Limitations  blue TB      Knee/Hip Exercises: Supine   Straight Leg Raises  Strengthening;Left;2 sets;10 reps    Straight Leg Raises Limitations  3#             PT Education - 01/16/18 1613    Education provided  Yes    Education Details  HEP update    Person(s) Educated  Patient    Methods  Explanation;Demonstration;Verbal cues;Handout    Comprehension  Verbalized understanding;Returned demonstration;Verbal cues required;Need further instruction       PT Short Term  Goals - 12/22/17 1618      PT SHORT TERM GOAL #1   Title  indepednent with initial HEP    Status  Achieved        PT Long Term Goals - 12/22/17 1618      PT LONG TERM GOAL #1   Title  decrease pain 50%    Status  Partially Met      PT LONG TERM GOAL #2   Title  understand RICE    Status  Achieved      PT LONG TERM GOAL #3   Title  increase AROM to 0-120 degrees flexion    Status  Partially Met      PT LONG TERM GOAL #4   Title  go up and down stairs step over step    Status  On-going      PT LONG TERM GOAL #5   Title  get up from floor wihtout difficulty    Status  On-going            Plan - 01/16/18 1542    Clinical Impression Statement  Heather Bowman doing well today with no new complaints.  Tolerated all LE strengthening activities focused on quad/VMO strengthening well today.  HEP updated.  Did require cuing with therex for proper quad activation today.  Will continue to progress toward goals.       PT Treatment/Interventions  ADLs/Self Care Home Management;Cryotherapy;Electrical Stimulation;Gait training;Neuromuscular re-education;Balance training;Therapeutic  exercise;Therapeutic activities;Functional mobility training;Stair training;Patient/family education;Manual techniques;Vasopneumatic Device    PT Next Visit Plan  quad strength,ROM,gait    Consulted and Agree with Plan of Care  Patient       Patient will benefit from skilled therapeutic intervention in order to improve the following deficits and impairments:  Abnormal gait, Decreased range of motion, Difficulty walking, Increased muscle spasms, Decreased activity tolerance, Pain, Decreased balance, Impaired flexibility, Decreased mobility, Decreased strength, Increased edema  Visit Diagnosis: Acute pain of left knee  Localized edema  Stiffness of left knee, not elsewhere classified  Difficulty in walking, not elsewhere classified     Problem List Patient Active Problem List   Diagnosis Date Noted  . Hemarthrosis 09/08/2017  . Endometrial polyp 12/30/2016  . Constipation 12/03/2016  . Axillary mass, right 12/03/2016  . Painful pinna of both ears on examination 05/14/2016  . Infertility counseling 03/21/2015  . Axillary adenitis 08/07/2013  . Excessive sweating 06/13/2013  . Nausea 01/17/2013  . Dysuria 07/11/2012  . PCOS (polycystic ovarian syndrome) 05/26/2012  . GAD (generalized anxiety disorder) 05/02/2012  . Chronic pain syndrome 05/02/2012  . GERD (gastroesophageal reflux disease) 05/02/2012    Bess Harvest, PTA 01/16/18 4:37 PM  [place] [address] [address] [address] [place], [place], [address] Phone: [phone]   Fax:  [phone]  Name: Heather Bowman MRN: [id number] Date of Birth: Mar 30, 1981

## 2018-01-23 ENCOUNTER — Ambulatory Visit: Payer: BLUE CROSS/BLUE SHIELD | Admitting: Physical Therapy

## 2018-01-23 ENCOUNTER — Encounter: Payer: Self-pay | Admitting: Physical Therapy

## 2018-01-23 DIAGNOSIS — M25562 Pain in left knee: Secondary | ICD-10-CM

## 2018-01-23 DIAGNOSIS — M25662 Stiffness of left knee, not elsewhere classified: Secondary | ICD-10-CM

## 2018-01-23 DIAGNOSIS — R262 Difficulty in walking, not elsewhere classified: Secondary | ICD-10-CM

## 2018-01-23 DIAGNOSIS — R600 Localized edema: Secondary | ICD-10-CM

## 2018-01-23 NOTE — Therapy (Signed)
[facility] [address] [place], [place], [address] Phone: [phone]   Fax:  [phone]  Physical Therapy Treatment  Patient Details  Name: Heather Bowman MRN: [id number] Date of Birth: [birth year] Referring Provider: Amil Amen   Encounter Date: 01/23/2018  PT End of Session - 01/23/18 1025    Visit Number  8    Date for PT Re-Evaluation  02/12/18    PT Start Time  8527    PT Stop Time  1602    PT Time Calculation (min)  39 min    Activity Tolerance  Patient tolerated treatment well    Behavior During Therapy  Anxious;WFL for tasks assessed/performed       Past Medical History:  Diagnosis Date  . Anxiety   . Arthritis   . Breast mass    right axilla  . Chronic pain syndrome   . Constipation    uses linzess daily   . Depression   . GERD (gastroesophageal reflux disease)   . IBS (irritable bowel syndrome)   . PCOS (polycystic ovarian syndrome)    on metformin for this    Past Surgical History:  Procedure Laterality Date  . COLONOSCOPY    . MYOMECTOMY    . POLYPECTOMY    . right axilla breast mass removal      There were no vitals filed for this visit.  Subjective Assessment - 01/23/18 1528    Subjective  "Going well"    Currently in Pain?  No/denies    Pain Score  5     Pain Location  Knee                No data recorded       [facility] Adult PT Treatment/Exercise - 01/23/18 0001      Knee/Hip Exercises: Aerobic   Tread Mill  5%grade 1.2MPH    Recumbent Bike  L 1 x 6 min       Knee/Hip Exercises: Standing   Forward Step Up  Left;10 reps;Step Height: 8";1 set    Step Down  Left;2 sets;10 reps;Step Height: 6"    Functional Squat  2 sets;10 reps    Walking with Sports Cord  40lb 4 way x5, then froward with 6in step up      Knee/Hip Exercises: Supine   Quad Sets  Left;10 reps;1 set    Short Arc Quad Sets  Left;2 sets;Strengthening;15 reps 3lb    Straight Leg Raises   Strengthening;Left;2 sets;10 reps    Straight Leg Raises Limitations  3#               PT Short Term Goals - 12/22/17 1618      PT SHORT TERM GOAL #1   Title  indepednent with initial HEP    Status  Achieved        PT Long Term Goals - 12/22/17 1618      PT LONG TERM GOAL #1   Title  decrease pain 50%    Status  Partially Met      PT LONG TERM GOAL #2   Title  understand RICE    Status  Achieved      PT LONG TERM GOAL #3   Title  increase AROM to 0-120 degrees flexion    Status  Partially Met      PT LONG TERM GOAL #4   Title  go up and down stairs step over step    Status  On-going  PT LONG TERM GOAL #5   Title  get up from floor wihtout difficulty    Status  On-going            Plan - 01/23/18 1605    Clinical Impression Statement  Pt ~ 8 minutes late for today's session. Pt appears to be a little robotic ambulating on treadmill with L hip an knee flexion. Pt with some compensation with functional squats. Pt has some instability with resisted gait. Pt with some extensor lag with SLR and shaking.    Rehab Potential  Good    PT Frequency  2x / week    PT Duration  8 weeks    PT Treatment/Interventions  ADLs/Self Care Home Management;Cryotherapy;Electrical Stimulation;Gait training;Neuromuscular re-education;Balance training;Therapeutic exercise;Therapeutic activities;Functional mobility training;Stair training;Patient/family education;Manual techniques;Vasopneumatic Device    PT Next Visit Plan  quad strength,ROM,gait       Patient will benefit from skilled therapeutic intervention in order to improve the following deficits and impairments:  Abnormal gait, Decreased range of motion, Difficulty walking, Increased muscle spasms, Decreased activity tolerance, Pain, Decreased balance, Impaired flexibility, Decreased mobility, Decreased strength, Increased edema  Visit Diagnosis: Acute pain of left knee  Localized edema  Stiffness of left knee, not  elsewhere classified  Difficulty in walking, not elsewhere classified     Problem List Patient Active Problem List   Diagnosis Date Noted  . Hemarthrosis 09/08/2017  . Endometrial polyp 12/30/2016  . Constipation 12/03/2016  . Axillary mass, right 12/03/2016  . Painful pinna of both ears on examination 05/14/2016  . Infertility counseling 03/21/2015  . Axillary adenitis 08/07/2013  . Excessive sweating 06/13/2013  . Nausea 01/17/2013  . Dysuria 07/11/2012  . PCOS (polycystic ovarian syndrome) 05/26/2012  . GAD (generalized anxiety disorder) 05/02/2012  . Chronic pain syndrome 05/02/2012  . GERD (gastroesophageal reflux disease) 05/02/2012    Scot Jun, PTA 01/23/2018, 4:10 PM  [address] [place], [place], [address] Phone: [phone]   Fax:  [phone]  Name: Dreamer Carillo MRN: [id number] Date of Birth: 1981/04/23

## 2018-01-24 ENCOUNTER — Encounter: Payer: Self-pay | Admitting: Physical Therapy

## 2018-01-24 ENCOUNTER — Ambulatory Visit: Payer: BLUE CROSS/BLUE SHIELD | Admitting: Physical Therapy

## 2018-01-24 DIAGNOSIS — M25562 Pain in left knee: Secondary | ICD-10-CM

## 2018-01-24 DIAGNOSIS — R600 Localized edema: Secondary | ICD-10-CM

## 2018-01-24 DIAGNOSIS — M25662 Stiffness of left knee, not elsewhere classified: Secondary | ICD-10-CM

## 2018-01-24 DIAGNOSIS — R262 Difficulty in walking, not elsewhere classified: Secondary | ICD-10-CM

## 2018-01-24 NOTE — Therapy (Signed)
[facility] [place], [place], [address] Phone: [phone]   Fax:  [phone]  Physical Therapy Treatment  Patient Details  Name: Heather Bowman MRN: [id number] Date of Birth: 06-Oct-1981 Referring Provider: Amil Amen   Encounter Date: 01/24/2018  PT End of Session - 01/24/18 1538    Visit Number  9    Date for PT Re-Evaluation  02/12/18    PT Start Time  1448    PT Stop Time  2119    PT Time Calculation (min)  56 min    Activity Tolerance  Patient tolerated treatment well    Behavior During Therapy  Anxious;WFL for tasks assessed/performed       Past Medical History:  Diagnosis Date  . Anxiety   . Arthritis   . Breast mass    right axilla  . Chronic pain syndrome   . Constipation    uses linzess daily   . Depression   . GERD (gastroesophageal reflux disease)   . IBS (irritable bowel syndrome)   . PCOS (polycystic ovarian syndrome)    on metformin for this    Past Surgical History:  Procedure Laterality Date  . COLONOSCOPY    . MYOMECTOMY    . POLYPECTOMY    . right axilla breast mass removal      There were no vitals filed for this visit.  Subjective Assessment - 01/24/18 1456    Subjective  I feel like I am doing alright    Currently in Pain?  No/denies    Aggravating Factors   c/o most pain with bending and straightening, stairs, 6/10                No data recorded       [facility] Adult PT Treatment/Exercise - 01/24/18 0001      Ambulation/Gait   Gait Comments  3 flights of stairs alt pattern one rail L  did some without handrail      Knee/Hip Exercises: Aerobic   Elliptical  R=5 I=10 x 4 minutes    Recumbent Bike  L 1 x 6 min       Knee/Hip Exercises: Machines for Strengthening   Cybex Leg Press  30lb 2x10; LLE only TKE 30# 2x10       Knee/Hip Exercises: Standing   Walking with Sports Cord  in hall with tband all directions      Knee/Hip Exercises: Supine   Short Arc Quad Sets  Left;2 sets;Strengthening;15 reps;Limitations    Short Arc Quad Sets Limitations  4# cues to focus on VMO    Other Supine Knee/Hip Exercises  feet on ball bridge, then bent knee bridges      Cryotherapy   Number Minutes Cryotherapy  15 Minutes    Cryotherapy Location  Knee    Type of Cryotherapy  Ice pack      Manual Therapy   Manual Therapy  Passive ROM    Passive ROM  passive ROM for flexion and extension, flexion in supine with leg off bed to get quads and hip flexor stretched out               PT Short Term Goals - 12/22/17 1618      PT SHORT TERM GOAL #1   Title  indepednent with initial HEP    Status  Achieved        PT Long Term Goals - 01/24/18 1539      PT LONG TERM  GOAL #1   Title  decrease pain 50%    Status  Partially Met      PT LONG TERM GOAL #3   Title  increase AROM to 0-120 degrees flexion    Status  Partially Met      PT LONG TERM GOAL #4   Title  go up and down stairs step over step    Status  Partially Met      PT LONG TERM GOAL #5   Title  get up from floor wihtout difficulty    Status  Partially Met            Plan - 01/24/18 1538    Clinical Impression Statement  Patient overall doing well, has some pain iwth higher steps, does the 6" steps pretty good with mild c/o pain going down step over step, she has some tightness in the quad and HS.  Some difficulkty with TKE and good VMO       Patient will benefit from skilled therapeutic intervention in order to improve the following deficits and impairments:  Abnormal gait, Decreased range of motion, Difficulty walking, Increased muscle spasms, Decreased activity tolerance, Pain, Decreased balance, Impaired flexibility, Decreased mobility, Decreased strength, Increased edema  Visit Diagnosis: Acute pain of left knee  Localized edema  Stiffness of left knee, not elsewhere classified  Difficulty in walking, not elsewhere classified     Problem List Patient  Active Problem List   Diagnosis Date Noted  . Hemarthrosis 09/08/2017  . Endometrial polyp 12/30/2016  . Constipation 12/03/2016  . Axillary mass, right 12/03/2016  . Painful pinna of both ears on examination 05/14/2016  . Infertility counseling 03/21/2015  . Axillary adenitis 08/07/2013  . Excessive sweating 06/13/2013  . Nausea 01/17/2013  . Dysuria 07/11/2012  . PCOS (polycystic ovarian syndrome) 05/26/2012  . GAD (generalized anxiety disorder) 05/02/2012  . Chronic pain syndrome 05/02/2012  . GERD (gastroesophageal reflux disease) 05/02/2012    Sumner Boast., PT 01/24/2018, 3:41 PM  [address] [place], [place], [address] Phone: [phone]   Fax:  [phone]  Name: Heather Bowman MRN: [id number] Date of Birth: Sep 13, 1981

## 2018-01-30 ENCOUNTER — Ambulatory Visit: Payer: BLUE CROSS/BLUE SHIELD | Admitting: Physical Therapy

## 2018-02-01 ENCOUNTER — Ambulatory Visit: Payer: BLUE CROSS/BLUE SHIELD | Admitting: Physical Therapy

## 2018-02-03 ENCOUNTER — Ambulatory Visit: Payer: BLUE CROSS/BLUE SHIELD | Admitting: Physical Therapy

## 2018-02-08 ENCOUNTER — Ambulatory Visit: Payer: BLUE CROSS/BLUE SHIELD | Attending: Nurse Practitioner | Admitting: Physical Therapy

## 2018-02-08 ENCOUNTER — Encounter: Payer: Self-pay | Admitting: Physical Therapy

## 2018-02-08 DIAGNOSIS — R262 Difficulty in walking, not elsewhere classified: Secondary | ICD-10-CM | POA: Diagnosis present

## 2018-02-08 DIAGNOSIS — M25662 Stiffness of left knee, not elsewhere classified: Secondary | ICD-10-CM

## 2018-02-08 DIAGNOSIS — M25562 Pain in left knee: Secondary | ICD-10-CM | POA: Insufficient documentation

## 2018-02-08 NOTE — Therapy (Signed)
[place], [place], [address] Phone: [phone]   Fax:  [phone]  Physical Therapy Treatment  Patient Details  Name: Heather Bowman MRN: [id number] Date of Birth: 10-15-1981 Referring Provider: Amil Amen   Encounter Date: 02/08/2018  PT End of Session - 02/08/18 1358    Visit Number  10    Date for PT Re-Evaluation  02/12/18    PT Start Time  1300    PT Stop Time  1345    PT Time Calculation (min)  45 min    Activity Tolerance  Patient tolerated treatment well    Behavior During Therapy  Anxious;WFL for tasks assessed/performed       Past Medical History:  Diagnosis Date  . Anxiety   . Arthritis   . Breast mass    right axilla  . Chronic pain syndrome   . Constipation    uses linzess daily   . Depression   . GERD (gastroesophageal reflux disease)   . IBS (irritable bowel syndrome)   . PCOS (polycystic ovarian syndrome)    on metformin for this    Past Surgical History:  Procedure Laterality Date  . COLONOSCOPY    . MYOMECTOMY    . POLYPECTOMY    . right axilla breast mass removal      There were no vitals filed for this visit.  Subjective Assessment - 02/08/18 1304    Subjective  "i'm doing and my knee is doing ok"    Currently in Pain?  Yes    Pain Score  5     Pain Location  Knee    Pain Orientation  Left                       [facility] Adult PT Treatment/Exercise - 02/08/18 0001      Ambulation/Gait   Gait Comments  3 flights of stairs alt pattern one rail L  did some without handrail      Knee/Hip Exercises: Aerobic   Recumbent Bike  L1 x 6 min      Knee/Hip Exercises: Standing   Stairs  3 flights of stairs: handrail needed, L knee pain during step down due to knee flexion limiitations    Walking with Sports Cord  in hall with tband all directions      Knee/Hip Exercises: Supine   Short Arc Quad Sets  2 sets 10 reps   Heel Slides  PROM;1 set;10  reps    Terminal Knee Extension  1 set;10 reps    Straight Leg Raises  Left;1 set;10 reps pt had weakness and extensor lag        Manual Therapy   Manual Therapy  Passive ROM;Other (comment) pt had some end point resistance    Other Manual Therapy  hip flexor stretch               PT Short Term Goals - 12/22/17 1618      PT SHORT TERM GOAL #1   Title  indepednent with initial HEP    Status  Achieved        PT Long Term Goals - 01/24/18 1539      PT LONG TERM GOAL #1   Title  decrease pain 50%    Status  Partially Met      PT LONG TERM GOAL #3   Title  increase AROM to 0-120 degrees flexion    Status  Partially  Met      PT LONG TERM GOAL #4   Title  go up and down stairs step over step    Status  Partially Met      PT LONG TERM GOAL #5   Title  get up from floor wihtout difficulty    Status  Partially Met            Plan - 02/08/18 1359    Clinical Impression Statement  Pt is doing well overall however she still is limited in her knee flexion ROM and exhibits quad weakness and extensor lag during straight leg raises. Pt reports that she has done some of her exercises at home.Pt reported that she is pregnant, and didn't want to do anything too taxing on her body.     Rehab Potential  Good    PT Frequency  2x / week    PT Duration  8 weeks    PT Treatment/Interventions  ADLs/Self Care Home Management;Cryotherapy;Electrical Stimulation;Gait training;Neuromuscular re-education;Balance training;Therapeutic exercise;Therapeutic activities;Functional mobility training;Stair training;Patient/family education;Manual techniques;Vasopneumatic Device    PT Next Visit Plan  quad strength,ROM       Patient will benefit from skilled therapeutic intervention in order to improve the following deficits and impairments:  Abnormal gait, Decreased range of motion, Difficulty walking, Increased muscle spasms, Decreased activity tolerance, Pain, Decreased balance, Impaired  flexibility, Decreased mobility, Decreased strength, Increased edema  Visit Diagnosis: Acute pain of left knee  Stiffness of left knee, not elsewhere classified  Difficulty in walking, not elsewhere classified     Problem List Patient Active Problem List   Diagnosis Date Noted  . Hemarthrosis 09/08/2017  . Endometrial polyp 12/30/2016  . Constipation 12/03/2016  . Axillary mass, right 12/03/2016  . Painful pinna of both ears on examination 05/14/2016  . Infertility counseling 03/21/2015  . Axillary adenitis 08/07/2013  . Excessive sweating 06/13/2013  . Nausea 01/17/2013  . Dysuria 07/11/2012  . PCOS (polycystic ovarian syndrome) 05/26/2012  . GAD (generalized anxiety disorder) 05/02/2012  . Chronic pain syndrome 05/02/2012  . GERD (gastroesophageal reflux disease) 05/02/2012    Scot Jun, PTA 02/08/2018, 2:11 PM  [address] [place], [place], [address] Phone: [phone]   Fax:  [phone]  Name: Heather Bowman MRN: [id number] Date of Birth: [birth year]

## 2018-02-09 ENCOUNTER — Ambulatory Visit: Payer: BLUE CROSS/BLUE SHIELD | Admitting: Physical Therapy

## 2018-02-09 ENCOUNTER — Encounter: Payer: Self-pay | Admitting: Physical Therapy

## 2018-02-09 DIAGNOSIS — M25662 Stiffness of left knee, not elsewhere classified: Secondary | ICD-10-CM

## 2018-02-09 DIAGNOSIS — M25562 Pain in left knee: Secondary | ICD-10-CM

## 2018-02-09 DIAGNOSIS — R262 Difficulty in walking, not elsewhere classified: Secondary | ICD-10-CM

## 2018-02-09 NOTE — Therapy (Addendum)
[place], [place], [address] Phone: [phone]   Fax:  [phone]  Physical Therapy Treatment  Patient Details  Name: Heather Bowman MRN: [id number] Date of Birth: Sep 18, 1981 Referring Provider: Amil Amen   Encounter Date: 02/09/2018  PT End of Session - 02/09/18 1713    Visit Number  11    Date for PT Re-Evaluation  02/12/18    PT Start Time  4580    PT Stop Time  1630    PT Time Calculation (min)  57 min       Past Medical History:  Diagnosis Date  . Anxiety   . Arthritis   . Breast mass    right axilla  . Chronic pain syndrome   . Constipation    uses linzess daily   . Depression   . GERD (gastroesophageal reflux disease)   . IBS (irritable bowel syndrome)   . PCOS (polycystic ovarian syndrome)    on metformin for this    Past Surgical History:  Procedure Laterality Date  . COLONOSCOPY    . MYOMECTOMY    . POLYPECTOMY    . right axilla breast mass removal      There were no vitals filed for this visit.  Subjective Assessment - 02/09/18 1559    Subjective  "my knee is stiff"    Pain Score  5     Pain Location  Knee    Pain Orientation  Left    Pain Descriptors / Indicators  Aching    Effect of Pain on Daily Activities  pain when stepping down stairs                       [facility] Adult PT Treatment/Exercise - 02/09/18 0001      Knee/Hip Exercises: Standing   Forward Step Up  2 sets;10 reps;Left with focusing on TKE    Functional Squat  2 sets;10 reps with Green TB for TKE    Other Standing Knee Exercises  forward,lateral,backward Band walks Green Theraband      Knee/Hip Exercises: Seated   Hamstring Curl  2 sets;Left;10 reps      Knee/Hip Exercises: Supine   Short Arc Quad Sets  2 sets;AROM;Strengthening;Left;10 reps    Terminal Knee Extension  1 set;10 reps    Straight Leg Raises  AROM;Strengthening;Both;1 set;10 reps VC cuing needed to activate  quad      Manual Therapy   Manual Therapy  Passive ROM;Joint mobilization patellar mobilizations all directions               PT Short Term Goals - 12/22/17 1618      PT SHORT TERM GOAL #1   Title  indepednent with initial HEP    Status  Achieved        PT Long Term Goals - 01/24/18 1539      PT LONG TERM GOAL #1   Title  decrease pain 50%    Status  Partially Met      PT LONG TERM GOAL #3   Title  increase AROM to 0-120 degrees flexion    Status  Partially Met      PT LONG TERM GOAL #4   Title  go up and down stairs step over step    Status  Partially Met      PT LONG TERM GOAL #5   Title  get up from floor wihtout difficulty  Status  Partially Met            Plan - 02/09/18 1715    Clinical Impression Statement  Pt has improved quad activation and increase ROM in her knee from previous PT session. She needed verbal cuing to activate her quad during Step ups and exhibits extensor lag during straight leg raises with cuff weight. Pt reports that she has difficulty with the descending the stairs due to weakness and lack of flexion in her knee. She reports that she is continuing to do her exercises at home and will work to increase her flexion.     PT Frequency  2x / week    PT Duration  8 weeks    PT Treatment/Interventions  ADLs/Self Care Home Management;Cryotherapy;Electrical Stimulation;Gait training;Neuromuscular re-education;Balance training;Therapeutic exercise;Therapeutic activities;Functional mobility training;Stair training;Patient/family education;Manual techniques;Vasopneumatic Device    PT Next Visit Plan  PT RE-EVAL NEXT WEEK.        Patient will benefit from skilled therapeutic intervention in order to improve the following deficits and impairments:  Abnormal gait, Decreased range of motion, Difficulty walking, Increased muscle spasms, Decreased activity tolerance, Pain, Decreased balance, Impaired flexibility, Decreased mobility, Decreased  strength, Increased edema  Visit Diagnosis: Acute pain of left knee  Stiffness of left knee, not elsewhere classified  Difficulty in walking, not elsewhere classified     Problem List Patient Active Problem List   Diagnosis Date Noted  . Hemarthrosis 09/08/2017  . Endometrial polyp 12/30/2016  . Constipation 12/03/2016  . Axillary mass, right 12/03/2016  . Painful pinna of both ears on examination 05/14/2016  . Infertility counseling 03/21/2015  . Axillary adenitis 08/07/2013  . Excessive sweating 06/13/2013  . Nausea 01/17/2013  . Dysuria 07/11/2012  . PCOS (polycystic ovarian syndrome) 05/26/2012  . GAD (generalized anxiety disorder) 05/02/2012  . Chronic pain syndrome 05/02/2012  . GERD (gastroesophageal reflux disease) 05/02/2012    Vito Berger, SPTA 02/09/2018, 5:28 PM Patient called and reported that she wanted to be discharged, felt like she could do on her own.  [place] [place], [place], [address] Phone: [phone]   Fax:  [phone]  Name: Zinnia Tindall MRN: [id number] Date of Birth: 1981-06-28

## 2018-02-20 ENCOUNTER — Ambulatory Visit: Payer: BLUE CROSS/BLUE SHIELD | Admitting: Physical Therapy

## 2018-02-22 ENCOUNTER — Ambulatory Visit: Payer: BLUE CROSS/BLUE SHIELD | Admitting: Physical Therapy

## 2018-03-16 LAB — OB RESULTS CONSOLE HEPATITIS B SURFACE ANTIGEN: HEP B S AG: NEGATIVE

## 2018-03-16 LAB — OB RESULTS CONSOLE HIV ANTIBODY (ROUTINE TESTING): HIV: NONREACTIVE

## 2018-03-16 LAB — OB RESULTS CONSOLE GC/CHLAMYDIA
Chlamydia: NEGATIVE
GC PROBE AMP, GENITAL: NEGATIVE

## 2018-03-16 LAB — OB RESULTS CONSOLE ABO/RH: RH Type: NEGATIVE

## 2018-03-16 LAB — OB RESULTS CONSOLE RPR: RPR: NONREACTIVE

## 2018-03-16 LAB — OB RESULTS CONSOLE RUBELLA ANTIBODY, IGM: Rubella: IMMUNE

## 2018-03-16 LAB — OB RESULTS CONSOLE ANTIBODY SCREEN

## 2018-03-17 ENCOUNTER — Telehealth: Payer: Self-pay | Admitting: Nurse Practitioner

## 2018-03-17 NOTE — Telephone Encounter (Signed)
Rec'd from [place] forwarded 8 pages to Dr. Wilfred Lacy

## 2018-05-02 ENCOUNTER — Other Ambulatory Visit: Payer: Self-pay

## 2018-05-02 ENCOUNTER — Encounter (HOSPITAL_COMMUNITY): Payer: Self-pay | Admitting: *Deleted

## 2018-05-02 ENCOUNTER — Inpatient Hospital Stay (HOSPITAL_BASED_OUTPATIENT_CLINIC_OR_DEPARTMENT_OTHER): Payer: Medicaid Other

## 2018-05-02 ENCOUNTER — Inpatient Hospital Stay (HOSPITAL_COMMUNITY)
Admission: AD | Admit: 2018-05-02 | Discharge: 2018-05-02 | Disposition: A | Discharge status: Home / Self Care | Payer: Medicaid Other | Source: Ambulatory Visit | Attending: Obstetrics and Gynecology | Admitting: Obstetrics and Gynecology

## 2018-05-02 ENCOUNTER — Inpatient Hospital Stay (HOSPITAL_COMMUNITY): Payer: Medicaid Other

## 2018-05-02 DIAGNOSIS — K589 Irritable bowel syndrome without diarrhea: Secondary | ICD-10-CM | POA: Insufficient documentation

## 2018-05-02 DIAGNOSIS — O4412 Placenta previa with hemorrhage, second trimester: Secondary | ICD-10-CM | POA: Diagnosis not present

## 2018-05-02 DIAGNOSIS — O3412 Maternal care for benign tumor of corpus uteri, second trimester: Secondary | ICD-10-CM | POA: Insufficient documentation

## 2018-05-02 DIAGNOSIS — O4402 Placenta previa specified as without hemorrhage, second trimester: Secondary | ICD-10-CM

## 2018-05-02 DIAGNOSIS — K219 Gastro-esophageal reflux disease without esophagitis: Secondary | ICD-10-CM | POA: Diagnosis not present

## 2018-05-02 DIAGNOSIS — K5900 Constipation, unspecified: Secondary | ICD-10-CM | POA: Insufficient documentation

## 2018-05-02 DIAGNOSIS — O4692 Antepartum hemorrhage, unspecified, second trimester: Secondary | ICD-10-CM | POA: Diagnosis present

## 2018-05-02 DIAGNOSIS — O09522 Supervision of elderly multigravida, second trimester: Secondary | ICD-10-CM | POA: Insufficient documentation

## 2018-05-02 DIAGNOSIS — D259 Leiomyoma of uterus, unspecified: Secondary | ICD-10-CM | POA: Insufficient documentation

## 2018-05-02 DIAGNOSIS — O99612 Diseases of the digestive system complicating pregnancy, second trimester: Secondary | ICD-10-CM | POA: Diagnosis not present

## 2018-05-02 DIAGNOSIS — E282 Polycystic ovarian syndrome: Secondary | ICD-10-CM | POA: Diagnosis not present

## 2018-05-02 DIAGNOSIS — F419 Anxiety disorder, unspecified: Secondary | ICD-10-CM | POA: Insufficient documentation

## 2018-05-02 DIAGNOSIS — Z3A16 16 weeks gestation of pregnancy: Secondary | ICD-10-CM | POA: Insufficient documentation

## 2018-05-02 DIAGNOSIS — R110 Nausea: Secondary | ICD-10-CM | POA: Diagnosis not present

## 2018-05-02 DIAGNOSIS — O09812 Supervision of pregnancy resulting from assisted reproductive technology, second trimester: Secondary | ICD-10-CM

## 2018-05-02 DIAGNOSIS — O99342 Other mental disorders complicating pregnancy, second trimester: Secondary | ICD-10-CM | POA: Insufficient documentation

## 2018-05-02 DIAGNOSIS — O99282 Endocrine, nutritional and metabolic diseases complicating pregnancy, second trimester: Secondary | ICD-10-CM | POA: Diagnosis not present

## 2018-05-02 LAB — URINALYSIS, MICROSCOPIC (REFLEX)

## 2018-05-02 LAB — CBC
HEMATOCRIT: 36.3 % (ref 36.0–46.0)
HEMOGLOBIN: 12.3 g/dL (ref 12.0–15.0)
MCH: 28 pg (ref 26.0–34.0)
MCHC: 33.9 g/dL (ref 30.0–36.0)
MCV: 82.5 fL (ref 78.0–100.0)
Platelets: 210 10*3/uL (ref 150–400)
RBC: 4.4 MIL/uL (ref 3.87–5.11)
RDW: 14 % (ref 11.5–15.5)
WBC: 9.1 10*3/uL (ref 4.0–10.5)

## 2018-05-02 LAB — URINALYSIS, ROUTINE W REFLEX MICROSCOPIC

## 2018-05-02 LAB — WET PREP, GENITAL
CLUE CELLS WET PREP: NONE SEEN
SPERM: NONE SEEN
TRICH WET PREP: NONE SEEN

## 2018-05-02 LAB — ABO/RH: ABO/RH(D): AB NEG

## 2018-05-02 MED ORDER — RHO D IMMUNE GLOBULIN 1500 UNIT/2ML IJ SOSY
300.0000 ug | PREFILLED_SYRINGE | Freq: Once | INTRAMUSCULAR | Status: AC
  Administered 2018-05-02: 300 ug via INTRAMUSCULAR
  Filled 2018-05-02: qty 2

## 2018-05-02 NOTE — MAU Note (Addendum)
Bleeding like really heavily,  Started about 32min before she came, is still bleeding. Cramping in lower abd.  Some nausea, has not eaten anything.  Called dr, was instructed to come here.  No prior bleeding with preg.   IVF pregnancy, every thing on [place] has been ok.

## 2018-05-02 NOTE — MAU Provider Note (Addendum)
Roxy Huneycutt is a 37 y.o. female G1P0 at 16+3 [facility] 10/14/18 by IVF by [facility]) with large bleeding earlier today.  Passing clots.  Describes as pouring out.  [place] in MAU reveals large fibroid 5x5.6 ? degenerating on ant wall (previously 4x4 and 1x2) also anterior previa noted.  Pregnancy complicated w some N/V - improved with Bonjesta/Protonix.  Also h/o depression/anxiety and AMA.  Pt declined genetic screening.  Was on quetipaine for sleep.  Has therapist.  Also occ phenergan.  Also told preDM, Hgb A1C 5.9 - getting early glucola.     OB History    Gravida  1   Para      Term      Preterm      AB      Living        SAB      TAB      Ectopic      Multiple      Live Births            G1 present + abn pap, remote; last 12/18 WNL HR HPV neg H/o Chl and Trich  Past Medical History:  Diagnosis Date  . Anxiety   . Arthritis   . Breast mass    right axilla  . Chronic pain syndrome   . Constipation    uses linzess daily   . Depression   . GERD (gastroesophageal reflux disease)   . IBS (irritable bowel syndrome)   . PCOS (polycystic ovarian syndrome)    on metformin for this   Past Surgical History:  Procedure Laterality Date  . COLONOSCOPY    . MYOMECTOMY    . POLYPECTOMY    . right axilla breast mass removal    knee arthroscopy WTE  Family History: family history includes Asthma in her brother; Breast cancer in her paternal aunt; Cancer in her maternal grandmother; Depression in her sister; Diabetes in her father and sister; Heart disease in her father; Hyperlipidemia in her father and sister; Hypertension in her father and sister; Stroke in her father. Social History:  reports that she has never smoked. She has never used smokeless tobacco. She reports that she drinks alcohol. She reports that she does not use drugs.     Maternal Diabetes: No Genetic Screening: Declined Maternal Ultrasounds/Referrals: Normal Fetal Ultrasounds or other Referrals:   None Maternal Substance Abuse:  No Significant Maternal Medications:  None Significant Maternal Lab Results:  None Other Comments:  HgbA1C= 5.9, uterine fibroids, previa  Review of Systems  Constitutional: Negative.   HENT: Negative.   Eyes: Negative.   Respiratory: Negative.   Cardiovascular: Negative.   Gastrointestinal: Positive for nausea.       Some abd pain  Genitourinary: Negative.   Musculoskeletal: Negative.   Skin: Negative.   Neurological: Negative.   Psychiatric/Behavioral: Negative.    Maternal Medical History:  Reason for admission: Nausea.       Blood pressure 134/81, pulse (!) 110, temperature 98.6 F (37 C), temperature source Oral, resp. rate 18, weight 84.5 kg (186 lb 4 oz), last menstrual period 09/20/2017, SpO2 100 %. Maternal Exam:  Abdomen: Patient reports no abdominal tenderness. Introitus: Normal vulva. Normal vagina.    Physical Exam  Constitutional: She is oriented to person, place, and time. She appears well-developed and well-nourished.  HENT:  Head: Normocephalic and atraumatic.  Cardiovascular: Normal rate and regular rhythm.  Respiratory: Effort normal and breath sounds normal.  GI: Soft. Bowel sounds are normal.  Musculoskeletal: Normal range  of motion.  Neurological: She is alert and oriented to person, place, and time.  Skin: Skin is warm and dry.  Psychiatric: She has a normal mood and affect. Her behavior is normal.     Pelvic - small old blood in vault, no active bleeding on f/u exam - First exam large clots.    Prenatal labs: ABO, Rh: --/--/AB NEG Performed at [facility], [address] [place], [place] [address]  [phone]) Antibody:  + - needs f/u Rubella:  immune RPR:   NR HBsAg:   neg HIV:   neg GBS:   UNKNOWN  Declines genetic screening/Hgb 13.0/Plt 298/Ur Cx + - will need TOC/GC neg/Chl neg/Varicella immune/Hgb electro WNL [place] CWD 4x4 cm fibroid, and 1.7x1.9cm fibroid; CL = 4.6cm.  Hgb A1C  5.9   Assessment/Plan: 37yo G1 at 16wk with large vaginal bleeding  Previa and degen fibroid on [place] - d/w pt inc risk of miscarriage and poor contractility in labor  +FHTs, viable preg  Awaiting MFM opinion on mgmt - Thinks from previa - stable Hgb - OK for discharge.  Bleeding precautions.  No IC while previa present - d/w pt.     Also Rh neg som Rhogam w/u  Deisha Stull Bovard-Stuckert 05/02/2018, 6:37 PM

## 2018-05-02 NOTE — MAU Provider Note (Signed)
History    Patient Heather Bowman is a 37 y.o. G1P0 At [redacted]w[redacted]d here with vaginal bleeding that started at 2 pm as well as abdominal cramps. She denies any complications with her pregnancy, she has not had any abnormal discharge, dysuria, HA, back pain or other concerning symptom until today.   CSN: [id number]  Arrival date and time: 05/02/18 1416   First Provider Initiated Contact with Patient 05/02/18 1622      Chief Complaint  Patient presents with  . Vaginal Bleeding  . Abdominal Pain   Vaginal Bleeding  The patient's primary symptoms include vaginal bleeding. This is a new problem. The current episode started today. The problem occurs constantly. The problem has been gradually worsening. She is pregnant. Associated symptoms include abdominal pain. Pertinent negatives include no constipation, diarrhea, flank pain, frequency, nausea, urgency or vomiting. The vaginal discharge was bloody. The vaginal bleeding is lighter than menses. She has been passing clots. She has not been passing tissue.  Abdominal Pain  This is a new problem. The current episode started today. The problem occurs intermittently. The pain is at a severity of 6/10. The quality of the pain is cramping. The abdominal pain does not radiate. Pertinent negatives include no constipation, diarrhea, frequency, nausea or vomiting. Nothing aggravates the pain. The pain is relieved by nothing.    OB History    Gravida  1   Para      Term      Preterm      AB      Living        SAB      TAB      Ectopic      Multiple      Live Births              Past Medical History:  Diagnosis Date  . Anxiety   . Arthritis   . Breast mass    right axilla  . Chronic pain syndrome   . Constipation    uses linzess daily   . Depression   . GERD (gastroesophageal reflux disease)   . IBS (irritable bowel syndrome)   . PCOS (polycystic ovarian syndrome)    on metformin for this    Past Surgical History:  Procedure  Laterality Date  . COLONOSCOPY    . MYOMECTOMY    . POLYPECTOMY    . right axilla breast mass removal      Family History  Problem Relation Age of Onset  . Heart disease Father   . Diabetes Father   . Hyperlipidemia Father   . Hypertension Father   . Stroke Father   . Depression Sister   . Diabetes Sister   . Hyperlipidemia Sister   . Hypertension Sister   . Asthma Brother   . Cancer Maternal Grandmother   . Breast cancer Paternal Aunt   . Colon cancer Neg Hx   . Colon polyps Neg Hx   . Esophageal cancer Neg Hx   . Pancreatic cancer Neg Hx   . Rectal cancer Neg Hx   . Stomach cancer Neg Hx     Social History   Tobacco Use  . Smoking status: Never Smoker  . Smokeless tobacco: Never Used  Substance Use Topics  . Alcohol use: Yes    Comment: on holidays or special occasions  . Drug use: No    Allergies: No Known Allergies  Medications Prior to Admission  Medication Sig Dispense Refill Last Dose  . escitalopram (LEXAPRO)  20 MG tablet Take 20 mg by mouth daily.   09/26/2017  . hyoscyamine (LEVSIN, ANASPAZ) 0.125 MG tablet Take 1 tablet (0.125 mg total) by mouth every 6 (six) hours as needed for cramping. 90 tablet 0 09/26/2017  . LINZESS 145 MCG CAPS capsule Take 1 capsule (145 mcg total) by mouth daily before breakfast. 90 capsule 1   . metFORMIN (GLUCOPHAGE-XR) 500 MG 24 hr tablet Take 1 tablet (500 mg total) by mouth daily with breakfast. (Patient taking differently: Take 500 mg by mouth daily with breakfast. Takes for PCOS) 90 tablet 3 09/27/2017  . metroNIDAZOLE (METROGEL) 0.75 % vaginal gel Place 1 Applicatorful vaginally 2 (two) times daily.  0 Unknown  . naproxen (NAPROSYN) 500 MG tablet Take 1 tablet (500 mg total) 2 (two) times daily as needed by mouth (for pain, take with food). 30 tablet 0 09/27/2017  . oxyCODONE-acetaminophen (PERCOCET/ROXICET) 5-325 MG tablet Take 1 tablet every 8 (eight) hours as needed by mouth for severe pain. 6 tablet 0 09/27/2017  .  pantoprazole (PROTONIX) 40 MG tablet Take 1 tablet (40 mg total) by mouth daily. 90 tablet 1   . promethazine (PHENERGAN) 25 MG tablet Take 1 tablet (25 mg total) by mouth every 8 (eight) hours as needed for nausea or vomiting. 30 tablet 0   . QUEtiapine (SEROQUEL) 50 MG tablet Take by mouth.   09/26/2017  . saccharomyces boulardii (FLORASTOR) 250 MG capsule Take 1 capsule (250 mg total) by mouth 2 (two) times daily. 60 capsule 1 Past Week    Review of Systems  Gastrointestinal: Positive for abdominal pain. Negative for constipation, diarrhea, nausea and vomiting.  Genitourinary: Positive for vaginal bleeding. Negative for flank pain, frequency and urgency.   Physical Exam   Blood pressure 134/81, pulse (!) 110, temperature 98.6 F (37 C), temperature source Oral, resp. rate 18, weight 186 lb 4 oz (84.5 kg), last menstrual period 09/20/2017, SpO2 100 %.  Physical Exam  Constitutional: She appears well-developed.  HENT:  Head: Normocephalic.  Eyes: Pupils are equal, round, and reactive to light.  Neck: Normal range of motion.  GI: Soft.  Genitourinary:  Genitourinary Comments: Normal external female genitalia; dark red blood in the vagina with one large clot and possible endometrial lining. Cervix is closed and thick,  Suprapubic is tender on bimanual but no CMT.   Musculoskeletal: Normal range of motion.  Neurological: She is alert.  Skin: Skin is warm and dry.  Psychiatric: She has a normal mood and affect.    MAU Course  Procedures  MDM -Bleeding is concerning; FHT auscultated but will order [place] to check for placenta location.  -US shows anterior previa and fibroid, Dr. Melba Coon at the bedside to assess patient.   -Rhogam work-up ordered; will give Rhogram as soon as lab has completed antibody testing.   Consulted with Dr. Melba Coon, who recommends patient stable for discharge with bleeding precautions and pelvic rest until previa has resolved.    Patient care handed over to  [place] at 2200  Assessment and Plan   1. Placenta previa in second trimester   2. Vaginal bleeding in pregnancy, second trimester    2. Patient stable for discharge with pelvic rest and bleeding precautions.   3. Return to MAU if any pain or bleeding.   4. Keep OB appt next Friday at Dr. Roe Rutherford office.   Mervyn Skeeters Zakk Borgen 05/02/2018, 4:31 PM

## 2018-05-02 NOTE — Discharge Instructions (Signed)
Pelvic Rest Pelvic rest may be recommended if:  Your placenta is partially or completely covering the opening of your cervix (placenta previa).  There is bleeding between the wall of the uterus and the amniotic sac in the first trimester of pregnancy (subchorionic hemorrhage).  You went into labor too early (preterm labor).  Based on your overall health and the health of your baby, your health care provider will decide if pelvic rest is right for you. How do I rest my pelvis? For as long as told by your health care provider:  Do not have sex, sexual stimulation, or an orgasm.  Do not use tampons. Do not douche. Do not put anything in your vagina.  Do not lift anything that is heavier than 10 lb (4.5 kg).  Avoid activities that take a lot of effort (are strenuous).  Avoid any activity in which your pelvic muscles could become strained.  When should I seek medical care? Seek medical care if you have:  Cramping pain in your lower abdomen.  Vaginal discharge.  A low, dull backache.  Regular contractions.  Uterine tightening.  When should I seek immediate medical care? Seek immediate medical care if:  You have vaginal bleeding and you are pregnant.  This information is not intended to replace advice given to you by your health care provider. Make sure you discuss any questions you have with your health care provider. Document Released: 02/12/2011 Document Revised: 03/25/2016 Document Reviewed: 04/21/2015 Elsevier Interactive Patient Education  2018 [employer].   Placenta Previa Placenta previa is a condition in which the placenta implants in the lower part of the uterus in pregnant women. The placenta either partially or completely covers the opening to the cervix. This is a problem because the baby must pass through the cervix during delivery. There are three types of placenta previa:  Marginal placenta previa. The placenta reaches within an inch (2.5 cm) of the  cervical opening but does not cover it.  Partial placenta previa. The placenta covers part of the cervical opening.  Complete placenta previa. The placenta covers the entire cervical opening.  If the previa is marginal or partial and it is diagnosed in the first half of pregnancy, the placenta may move into a normal position as the pregnancy progresses and may no longer cover the cervix. It is important to keep all prenatal visits with your health care provider so you can be more closely monitored. What are the causes? The cause of this condition is not known. What increases the risk? This condition is more likely to develop in women who:  Are carrying more than one baby (multiples).  Have an abnormally shaped uterus.  Have scars on the lining of the uterus.  Have had surgeries involving the uterus, such as a cesarean delivery.  Have delivered a baby before.  Have a history of placenta previa.  Have smoked or used cocaine during pregnancy.  Are age 35 or older during pregnancy.  What are the signs or symptoms? The main symptom of this condition is sudden, painless vaginal bleeding during the second half of pregnancy. The amount of bleeding can be very light at first, and it usually stops on its own. Heavier bleeding episodes may also happen. Some women with placenta previa may have no bleeding at all. How is this diagnosed?  This condition is diagnosed: ? From an ultrasound. This test uses sound waves to find where the placenta is located before you have any bleeding episodes. ? During  a checkup after vaginal bleeding is noticed.  If you are diagnosed with a partial or complete previa, digital exams with fingers will generally be avoided. Your health care provider will still perform a speculum exam.  If you did not have an ultrasound during your pregnancy, placenta previa may not be diagnosed until bleeding occurs during labor. How is this treated? Treatment for this condition  may include:  Decreased activity.  Bed rest at home or in the hospital.  Pelvic rest. Nothing is placed inside the vagina during pelvic rest. This means not having sex and not using tampons or douches.  A blood transfusion to replace blood that you have lost (maternal blood loss).  A cesarean delivery. This may be performed if: ? The bleeding is heavy and cannot be controlled. ? The placenta completely covers the cervix.  Medicines to stop premature labor or to help the baby's lungs to mature. This treatment may be used if you need delivery before your pregnancy is full-term.  Your treatment will be decided based on:  How much you are bleeding, or whether the bleeding has stopped.  How far along you are in your pregnancy.  The condition of your baby.  The type of placenta previa that you have.  Follow these instructions at home:  Get plenty of rest and lessen activity as told by your health care provider.  Stay on bed rest for as long as told by your health care provider.  Do not have sex, use tampons, use a douche, or place anything inside of your vagina if your health care provider recommended pelvic rest.  Take over-the-counter and prescription medicines as told by your health care provider.  Keep all follow-up visits as told by your health care provider. This is important. Get help right away if:  You have vaginal bleeding, even if in small amounts and even if you have no pain.  You have cramping or regular contractions.  You have pain in your abdomen or your lower back.  You have a feeling of increased pressure in your pelvis.  You have increased watery or bloody mucus from the vagina. This information is not intended to replace advice given to you by your health care provider. Make sure you discuss any questions you have with your health care provider. Document Released: 10/18/2005 Document Revised: 07/07/2016 Document Reviewed: 05/01/2016 Elsevier Interactive  Patient Education  [employer].

## 2018-05-03 LAB — GC/CHLAMYDIA PROBE AMP (~~LOC~~) NOT AT ARMC
CHLAMYDIA, DNA PROBE: NEGATIVE
NEISSERIA GONORRHEA: NEGATIVE

## 2018-05-05 LAB — RH IG WORKUP (INCLUDES ABO/RH)
ABO/RH(D): AB NEG
ANTIBODY SCREEN: POSITIVE
Gestational Age(Wks): 16.3
UNIT DIVISION: 0

## 2018-06-07 ENCOUNTER — Ambulatory Visit: Payer: Self-pay | Admitting: Registered"

## 2018-06-14 ENCOUNTER — Encounter: Payer: Self-pay | Admitting: Registered"

## 2018-06-14 ENCOUNTER — Encounter: Payer: Medicaid Other | Attending: Obstetrics and Gynecology | Admitting: Registered"

## 2018-06-14 DIAGNOSIS — O24410 Gestational diabetes mellitus in pregnancy, diet controlled: Secondary | ICD-10-CM | POA: Insufficient documentation

## 2018-06-14 DIAGNOSIS — Z713 Dietary counseling and surveillance: Secondary | ICD-10-CM | POA: Diagnosis not present

## 2018-06-14 DIAGNOSIS — Z3A00 Weeks of gestation of pregnancy not specified: Secondary | ICD-10-CM | POA: Insufficient documentation

## 2018-06-14 DIAGNOSIS — O99810 Abnormal glucose complicating pregnancy: Secondary | ICD-10-CM

## 2018-06-14 NOTE — Progress Notes (Signed)
Patient was seen on 06/14/2018 for Gestational Diabetes self-management class at the [facility]. The following learning objectives were met by the patient during this course:   States the definition of Gestational Diabetes  States why dietary management is important in controlling blood glucose  Describes the effects each nutrient has on blood glucose levels  Demonstrates ability to create a balanced meal plan  Demonstrates carbohydrate counting   States when to check blood glucose levels  Demonstrates proper blood glucose monitoring techniques  States the effect of stress and exercise on blood glucose levels  States the importance of limiting caffeine and abstaining from alcohol and smoking  Blood glucose monitor given: Accu-chek Guide Lot # [id number] Exp: 02/14/19 Blood glucose reading: 149 mg/dL  Patient instructed to monitor glucose levels: FBS: 60 - <95; 1 hour: <140; 2 hour: <120  Patient received handouts:  Nutrition Diabetes and Pregnancy, including carb counting list  Patient will be seen for follow-up as needed.

## 2018-07-06 ENCOUNTER — Other Ambulatory Visit: Payer: Self-pay | Admitting: Obstetrics and Gynecology

## 2018-07-06 ENCOUNTER — Other Ambulatory Visit (HOSPITAL_COMMUNITY)
Admission: RE | Admit: 2018-07-06 | Discharge: 2018-07-06 | Disposition: A | Discharge status: Home / Self Care | Payer: Medicaid Other | Source: Ambulatory Visit | Attending: Obstetrics and Gynecology | Admitting: Obstetrics and Gynecology

## 2018-07-06 ENCOUNTER — Inpatient Hospital Stay (HOSPITAL_COMMUNITY)
Admission: AD | Admit: 2018-07-06 | Discharge: 2018-07-06 | Disposition: A | Discharge status: Home / Self Care | Payer: Medicaid Other | Source: Ambulatory Visit | Attending: Obstetrics and Gynecology | Admitting: Obstetrics and Gynecology

## 2018-07-06 DIAGNOSIS — O361121 Maternal care for Anti-A sensitization, second trimester, fetus 1: Secondary | ICD-10-CM | POA: Insufficient documentation

## 2018-07-06 DIAGNOSIS — O361111 Maternal care for Anti-A sensitization, first trimester, fetus 1: Secondary | ICD-10-CM

## 2018-07-06 LAB — TYPE AND SCREEN
ABO/RH(D): AB NEG
Antibody Screen: POSITIVE

## 2018-07-06 NOTE — Progress Notes (Signed)
Dr Willis Modena notified of reason why patient stated she came to [facility] hospital. After reviewing chart, he states that she needs no testing today. Pt discharged home

## 2018-07-06 NOTE — Progress Notes (Signed)
Pt with antibody screen at office, unable to ID antibodies.  Recc repeat at hospital

## 2018-09-19 LAB — OB RESULTS CONSOLE GBS: GBS: POSITIVE

## 2018-10-06 ENCOUNTER — Telehealth (HOSPITAL_COMMUNITY): Payer: Self-pay | Admitting: *Deleted

## 2018-10-06 ENCOUNTER — Ambulatory Visit (INDEPENDENT_AMBULATORY_CARE_PROVIDER_SITE_OTHER): Payer: Self-pay | Admitting: Pediatrics

## 2018-10-06 ENCOUNTER — Encounter (HOSPITAL_COMMUNITY): Payer: Self-pay | Admitting: *Deleted

## 2018-10-06 DIAGNOSIS — Z7681 Expectant parent(s) prebirth pediatrician visit: Secondary | ICD-10-CM

## 2018-10-06 NOTE — Telephone Encounter (Signed)
Preadmission screen  

## 2018-10-08 NOTE — H&P (Signed)
Heather Bowman is a 37 y.o. female G1P0 presenting for IOL given term/favorable cervix and DM2.  Pregnancy is product of IVF, has had appropriate antenatal testing and decent glucose control.  Pregnancy also complicated by AMA.  Pregnancy complicated by chronic pain - not been issue in pregnancy, has chronic pain clinic.  Also + ab screen.  Has anxiety and depression - therapist, seroquel for sleep.  Received Tdap and Rhogam 07/26/18  DM is GDM or preexisting, likely preexisting elevated HgbA1C, failed early glucola.  On Metformin for control 1000 mg qhs and 500mg  q am.  Also early bleeding w previa.  D/W pt IOL inc r/b/a, ready to proceed.    OB History    Gravida  1   Para      Term      Preterm      AB      Living        SAB      TAB      Ectopic      Multiple      Live Births            G1 present, IVF  remore abn pap, last WNL HR HPV neg + STD Chl and trich  Past Medical History:  Diagnosis Date  . Anxiety   . Arthritis   . Breast mass    right axilla  . Chronic pain syndrome   . Constipation    uses linzess daily   . Depression   . Fibroid   . GERD (gastroesophageal reflux disease)   . IBS (irritable bowel syndrome)   . PCOS (polycystic ovarian syndrome)    on metformin for this   Past Surgical History:  Procedure Laterality Date  . COLONOSCOPY    . KNEE SURGERY    . MYOMECTOMY    . POLYPECTOMY    . right axilla breast mass removal    . WISDOM TOOTH EXTRACTION     Family History: family history includes Asthma in her brother; Breast cancer in her paternal aunt; Cancer in her maternal grandmother; Depression in her sister; Diabetes in her father and sister; Heart disease in her father; Hyperlipidemia in her father and sister; Hypertension in her father and sister; Stroke in her father. Social History:  reports that she has never smoked. She has never used smokeless tobacco. She reports that she drinks alcohol. She reports that she does not use drugs. On  disabiity  Meds: Linzess, PNV, Metformin 500qAM, 100qhs), protonix, seroquel All: NKDA     Maternal Diabetes: Yes:  Diabetes Type:  Pre-pregnancy Genetic Screening: Declined Maternal Ultrasounds/Referrals: Normal Fetal Ultrasounds or other Referrals:  None Maternal Substance Abuse:  No Significant Maternal Medications:  Meds include: Other: Linzess, seroquel Significant Maternal Lab Results:  Lab values include: Group B Strep positive Other Comments:  + Ab screen, AMA  Review of Systems  Constitutional: Negative.   HENT: Negative.   Eyes: Negative.   Respiratory: Negative.   Cardiovascular: Negative.   Gastrointestinal: Negative.   Genitourinary: Negative.   Musculoskeletal: Positive for back pain.  Skin: Negative.   Neurological: Negative.   Psychiatric/Behavioral: Negative.    Maternal Medical History:  Contractions: Frequency: irregular.   Perceived severity is moderate.    Fetal activity: Perceived fetal activity is normal.    Prenatal Complications - Diabetes: type 2. Diabetes is managed by oral agent (monotherapy).        Last menstrual period 09/20/2017. Maternal Exam:  Uterine Assessment: Contraction strength is moderate.  Contraction frequency is irregular.   Abdomen: Patient reports no abdominal tenderness. Fundal height is appropriate for gestation.   Estimated fetal weight is 8#.   Fetal presentation: vertex  Introitus: Normal vulva. Normal vagina.    Physical Exam  Constitutional: She is oriented to person, place, and time. She appears well-developed and well-nourished.  HENT:  Head: Normocephalic and atraumatic.  Cardiovascular: Normal rate and regular rhythm.  Respiratory: Effort normal. No respiratory distress. She has no wheezes.  GI: Soft. Bowel sounds are normal. She exhibits no distension. There is no tenderness.  Musculoskeletal: Normal range of motion.  Neurological: She is alert and oriented to person, place, and time.  Skin: Skin is  warm and dry.  Psychiatric: She has a normal mood and affect. Her behavior is normal.    Prenatal labs: ABO, Rh: --/--/AB NEG (09/05 1540) Antibody: POS (09/05 1540) Rubella: Immune (05/16 0000) RPR: Nonreactive (05/16 0000)  HBsAg: Negative (05/16 0000)  HIV: Non-reactive (05/16 0000)  GBS: Positive (11/19 0000)   Dated by [facility] 1/50/-2 Rhogam, Tdap 9/25  Hgb 13.0/Plt298/UrCx +/ GC neg/Chl neg/Varicella Immune/Hgb electro WNL/ elevated HgbA1C - elevated glucola = GDM/  Nl ant, vtx, female, previa F/u previa resolved   Assessment/Plan: 37yo G1P0 at 6 w DM2 and AMA  Admit - AROM and pitocin to augment Expect SVD Epidural or IV pain meds prn Close monitoring FSBS PCN for + GBBS  Jettie Lazare Bovard-Stuckert 10/08/2018, 5:48 PM

## 2018-10-08 NOTE — H&P (Deleted)
  The note originally documented on this encounter has been moved the the encounter in which it belongs.  

## 2018-10-09 ENCOUNTER — Inpatient Hospital Stay (HOSPITAL_COMMUNITY): Payer: Medicare Other | Admitting: Anesthesiology

## 2018-10-09 ENCOUNTER — Inpatient Hospital Stay (HOSPITAL_COMMUNITY)
Admission: AD | Admit: 2018-10-09 | Discharge: 2018-10-14 | DRG: 788 | Disposition: A | Discharge status: Home / Self Care | Payer: Medicare Other | Attending: Obstetrics and Gynecology | Admitting: Obstetrics and Gynecology

## 2018-10-09 ENCOUNTER — Inpatient Hospital Stay (HOSPITAL_COMMUNITY)
Admission: RE | Admit: 2018-10-09 | Discharge: 2018-10-09 | Disposition: A | Discharge status: Home / Self Care | Payer: Medicare Other | Source: Ambulatory Visit | Attending: Obstetrics and Gynecology | Admitting: Obstetrics and Gynecology

## 2018-10-09 ENCOUNTER — Encounter (HOSPITAL_COMMUNITY): Payer: Self-pay

## 2018-10-09 DIAGNOSIS — IMO0001 Reserved for inherently not codable concepts without codable children: Secondary | ICD-10-CM | POA: Diagnosis present

## 2018-10-09 DIAGNOSIS — Z3A39 39 weeks gestation of pregnancy: Secondary | ICD-10-CM

## 2018-10-09 DIAGNOSIS — O24425 Gestational diabetes mellitus in childbirth, controlled by oral hypoglycemic drugs: Secondary | ICD-10-CM | POA: Diagnosis present

## 2018-10-09 DIAGNOSIS — O99824 Streptococcus B carrier state complicating childbirth: Secondary | ICD-10-CM | POA: Diagnosis present

## 2018-10-09 DIAGNOSIS — O324XX0 Maternal care for high head at term, not applicable or unspecified: Secondary | ICD-10-CM | POA: Diagnosis present

## 2018-10-09 DIAGNOSIS — O24313 Unspecified pre-existing diabetes mellitus in pregnancy, third trimester: Secondary | ICD-10-CM

## 2018-10-09 HISTORY — DX: Type 2 diabetes mellitus without complications: E11.9

## 2018-10-09 LAB — GLUCOSE, CAPILLARY
GLUCOSE-CAPILLARY: 75 mg/dL (ref 70–99)
Glucose-Capillary: 48 mg/dL — ABNORMAL LOW (ref 70–99)
Glucose-Capillary: 48 mg/dL — ABNORMAL LOW (ref 70–99)
Glucose-Capillary: 48 mg/dL — ABNORMAL LOW (ref 70–99)
Glucose-Capillary: 63 mg/dL — ABNORMAL LOW (ref 70–99)
Glucose-Capillary: 67 mg/dL — ABNORMAL LOW (ref 70–99)
Glucose-Capillary: 68 mg/dL — ABNORMAL LOW (ref 70–99)
Glucose-Capillary: 76 mg/dL (ref 70–99)
Glucose-Capillary: 79 mg/dL (ref 70–99)
Glucose-Capillary: 81 mg/dL (ref 70–99)

## 2018-10-09 LAB — CBC
HCT: 43.4 % (ref 36.0–46.0)
HEMOGLOBIN: 14 g/dL (ref 12.0–15.0)
MCH: 27.1 pg (ref 26.0–34.0)
MCHC: 32.3 g/dL (ref 30.0–36.0)
MCV: 84.1 fL (ref 80.0–100.0)
Platelets: 185 10*3/uL (ref 150–400)
RBC: 5.16 MIL/uL — ABNORMAL HIGH (ref 3.87–5.11)
RDW: 22.8 % — ABNORMAL HIGH (ref 11.5–15.5)
WBC: 7 10*3/uL (ref 4.0–10.5)
nRBC: 0 % (ref 0.0–0.2)

## 2018-10-09 LAB — RPR: RPR Ser Ql: NONREACTIVE

## 2018-10-09 MED ORDER — SOD CITRATE-CITRIC ACID 500-334 MG/5ML PO SOLN
30.0000 mL | ORAL | Status: DC | PRN
  Administered 2018-10-10: 30 mL via ORAL
  Filled 2018-10-09: qty 15

## 2018-10-09 MED ORDER — LACTATED RINGERS IV SOLN
INTRAVENOUS | Status: DC
  Administered 2018-10-09 – 2018-10-10 (×4): via INTRAVENOUS

## 2018-10-09 MED ORDER — EPHEDRINE 5 MG/ML INJ: 10.0000 mg | INTRAVENOUS | Status: DC | PRN

## 2018-10-09 MED ORDER — BUTORPHANOL TARTRATE 1 MG/ML IJ SOLN
1.0000 mg | INTRAMUSCULAR | Status: DC | PRN
  Administered 2018-10-09: 1 mg via INTRAVENOUS
  Filled 2018-10-09: qty 1

## 2018-10-09 MED ORDER — ACETAMINOPHEN 325 MG PO TABS: 650.0000 mg | ORAL_TABLET | ORAL | Status: DC | PRN

## 2018-10-09 MED ORDER — PENICILLIN G 3 MILLION UNITS IVPB - SIMPLE MED
3.0000 10*6.[IU] | INTRAVENOUS | Status: DC
  Administered 2018-10-09 – 2018-10-10 (×4): 3 10*6.[IU] via INTRAVENOUS

## 2018-10-09 MED ORDER — SODIUM CHLORIDE 0.9 % IV SOLN
5.0000 10*6.[IU] | Freq: Once | INTRAVENOUS | Status: AC
  Administered 2018-10-09: 5 10*6.[IU] via INTRAVENOUS
  Filled 2018-10-09: qty 5

## 2018-10-09 MED ORDER — PHENYLEPHRINE 40 MCG/ML (10ML) SYRINGE FOR IV PUSH (FOR BLOOD PRESSURE SUPPORT)
80.0000 ug | PREFILLED_SYRINGE | INTRAVENOUS | Status: DC | PRN
  Filled 2018-10-09: qty 10

## 2018-10-09 MED ORDER — LACTATED RINGERS IV SOLN: 500.0000 mL | Freq: Once | INTRAVENOUS | Status: DC

## 2018-10-09 MED ORDER — OXYCODONE-ACETAMINOPHEN 5-325 MG PO TABS: 1.0000 | ORAL_TABLET | ORAL | Status: DC | PRN

## 2018-10-09 MED ORDER — PHENYLEPHRINE 40 MCG/ML (10ML) SYRINGE FOR IV PUSH (FOR BLOOD PRESSURE SUPPORT): 80.0000 ug | PREFILLED_SYRINGE | INTRAVENOUS | Status: DC | PRN

## 2018-10-09 MED ORDER — OXYTOCIN BOLUS FROM INFUSION: 500.0000 mL | Freq: Once | INTRAVENOUS | Status: DC

## 2018-10-09 MED ORDER — OXYTOCIN 40 UNITS IN LACTATED RINGERS INFUSION - SIMPLE MED: 2.5000 [IU]/h | INTRAVENOUS | Status: DC

## 2018-10-09 MED ORDER — OXYTOCIN 40 UNITS IN LACTATED RINGERS INFUSION - SIMPLE MED
1.0000 m[IU]/min | INTRAVENOUS | Status: DC
  Administered 2018-10-09: 2 m[IU]/min via INTRAVENOUS
  Filled 2018-10-09: qty 1000

## 2018-10-09 MED ORDER — ONDANSETRON HCL 4 MG/2ML IJ SOLN
4.0000 mg | Freq: Four times a day (QID) | INTRAMUSCULAR | Status: DC | PRN
  Administered 2018-10-09 (×2): 4 mg via INTRAVENOUS
  Filled 2018-10-09 (×2): qty 2

## 2018-10-09 MED ORDER — LIDOCAINE HCL (PF) 1 % IJ SOLN
INTRAMUSCULAR | Status: DC | PRN
  Administered 2018-10-09: 5 mL via EPIDURAL

## 2018-10-09 MED ORDER — LACTATED RINGERS IV SOLN
500.0000 mL | INTRAVENOUS | Status: DC | PRN
  Administered 2018-10-09: 1000 mL via INTRAVENOUS

## 2018-10-09 MED ORDER — DIPHENHYDRAMINE HCL 50 MG/ML IJ SOLN: 12.5000 mg | INTRAMUSCULAR | Status: DC | PRN

## 2018-10-09 MED ORDER — LIDOCAINE HCL (PF) 1 % IJ SOLN
30.0000 mL | INTRAMUSCULAR | Status: DC | PRN
  Filled 2018-10-09: qty 30

## 2018-10-09 MED ORDER — TERBUTALINE SULFATE 1 MG/ML IJ SOLN: 0.2500 mg | Freq: Once | INTRAMUSCULAR | Status: DC | PRN

## 2018-10-09 MED ORDER — OXYCODONE-ACETAMINOPHEN 5-325 MG PO TABS: 2.0000 | ORAL_TABLET | ORAL | Status: DC | PRN

## 2018-10-09 MED ORDER — FENTANYL 2.5 MCG/ML BUPIVACAINE 1/10 % EPIDURAL INFUSION (WH - ANES)
14.0000 mL/h | INTRAMUSCULAR | Status: DC | PRN
  Administered 2018-10-09 (×2): 14 mL/h via EPIDURAL
  Filled 2018-10-09 (×3): qty 100

## 2018-10-09 NOTE — Progress Notes (Signed)
Hypoglycemic Event  CBG: 48  Treatment: 8 oz juice/soda  Symptoms: None  Follow-up CBG: Time:2246 CBG Result:48  Possible Reasons for Event: Vomiting  Comments/MD notified: patient given 8 oz of juice; no symptoms. Will continue to monitor and will re-check     Blythe Hartshorn C

## 2018-10-09 NOTE — Progress Notes (Signed)
Updated Dr. Melba Coon of subsequent CBG's of 48. Given 8 oz of juice and popsicle. No new orders at this time. Will continue to monitor. Raquel Sarna Chinita Schimpf RN

## 2018-10-09 NOTE — Anesthesia Pain Management Evaluation Note (Signed)
  CRNA Pain Management Visit Note  Patient: Heather Bowman, 37 y.o., female  "Hello I am a member of the anesthesia team at [facility]. We have an anesthesia team available at all times to provide care throughout the hospital, including epidural management and anesthesia for C-section. I don't know your plan for the delivery whether it a natural birth, water birth, IV sedation, nitrous supplementation, doula or epidural, but we want to meet your pain goals."   1.Was your pain managed to your expectations on prior hospitalizations?   No prior hospitalizations  2.What is your expectation for pain management during this hospitalization?     Epidural  3.How can we help you reach that goal? epidural  Record the patient's initial score and the patient's pain goal.   Pain: 0  Pain Goal: 5 [facility] wants you to be able to say your pain was always managed very well.  Juniper Cobey 10/09/2018

## 2018-10-09 NOTE — Anesthesia Procedure Notes (Addendum)
Epidural Patient location during procedure: OB Start time: 10/09/2018 1:37 PM End time: 10/09/2018 1:56 PM  Staffing Anesthesiologist: Barnet Glasgow, MD Performed: anesthesiologist   Preanesthetic Checklist Completed: patient identified, site marked, surgical consent, pre-op evaluation, timeout performed, IV checked, risks and benefits discussed and monitors and equipment checked  Epidural Patient position: sitting Prep: site prepped and draped and DuraPrep Patient monitoring: continuous pulse ox and blood pressure Approach: midline Location: L3-L4 Injection technique: LOR air  Needle:  Needle type: Tuohy  Needle gauge: 17 G Needle length: 9 cm and 9 Needle insertion depth: 8 cm Catheter type: closed end flexible Catheter size: 19 Gauge Catheter at skin depth: 13 cm Test dose: negative  Assessment Events: blood not aspirated, injection not painful, no injection resistance, negative IV test and no paresthesia  Additional Notes Patient identified. Risks/Benefits/Options discussed with patient including but not limited to bleeding, infection, nerve damage, paralysis, failed block, incomplete pain control, headache, blood pressure changes, nausea, vomiting, reactions to medication both or allergic, itching and postpartum back pain. Confirmed with bedside nurse the patient's most recent platelet count. Confirmed with patient that they are not currently taking any anticoagulation, have any bleeding history or any family history of bleeding disorders. Patient expressed understanding and wished to proceed. All questions were answered. Sterile technique was used throughout the entire procedure. Please see nursing notes for vital signs. Test dose was given through epidural needle and negative prior to continuing to dose epidural or start infusion. Warning signs of high block given to the patient including shortness of breath, tingling/numbness in hands, complete motor block, or any  concerning symptoms with instructions to call for help. Patient was given instructions on fall risk and not to get out of bed. All questions and concerns addressed with instructions to call with any issues. 1 Attempt (S) . Patient tolerated procedure well.

## 2018-10-09 NOTE — Progress Notes (Signed)
Patient ID: Heather Bowman, female   DOB: 1981/06/03, 37 y.o.   MRN: [id number]  Pt comfortable w epidural.  Pt recently had emesis  AFVSS gen NAD FHTs 120130's, mod var, + accels, category 1 toco Q 2 min  IUPC placed w/o diff/comp  Will adjust pitocin prn  SVE 8.9/90/0-+1

## 2018-10-09 NOTE — Anesthesia Preprocedure Evaluation (Addendum)
Anesthesia Evaluation  Patient identified by MRN, date of birth, ID band Patient awake    Reviewed: Allergy & Precautions, NPO status , Patient's Chart, lab work & pertinent test results  Airway Mallampati: II  TM Distance: >3 FB Neck ROM: Full    Dental no notable dental hx. (+) Teeth Intact   Pulmonary neg pulmonary ROS,    Pulmonary exam normal breath sounds clear to auscultation       Cardiovascular negative cardio ROS Normal cardiovascular exam Rhythm:Regular Rate:Normal     Neuro/Psych PSYCHIATRIC DISORDERS Anxiety Depression negative neurological ROS     GI/Hepatic GERD  Medicated,  Endo/Other  diabetes, Gestational  Renal/GU   negative genitourinary   Musculoskeletal  (+) Arthritis ,   Abdominal   Peds  Hematology   Anesthesia Other Findings   Reproductive/Obstetrics (+) Pregnancy                            Lab Results  Component Value Date   WBC 7.0 10/09/2018   HGB 14.0 10/09/2018   HCT 43.4 10/09/2018   MCV 84.1 10/09/2018   PLT 185 10/09/2018    Anesthesia Physical Anesthesia Plan  ASA: III and emergent  Anesthesia Plan: Epidural   Post-op Pain Management:    Induction:   PONV Risk Score and Plan:   Airway Management Planned: Natural Airway  Additional Equipment:   Intra-op Plan:   Post-operative Plan:   Informed Consent: I have reviewed the patients History and Physical, chart, labs and discussed the procedure including the risks, benefits and alternatives for the proposed anesthesia with the patient or authorized representative who has indicated his/her understanding and acceptance.     Plan Discussed with: CRNA, Surgeon and Anesthesiologist  Anesthesia Plan Comments: (Patient for C/Section for arrest of descent. Will use epidural for C/S. Bryon Lions, MD)       Anesthesia Quick Evaluation

## 2018-10-09 NOTE — Progress Notes (Signed)
Patient ID: Heather Bowman, female   DOB: 28-Apr-1981, 37 y.o.   MRN: [id number]   Late entry  AROM for meconium, d/w pt. Pt 8/90/0

## 2018-10-09 NOTE — Progress Notes (Signed)
Patient ID: Heather Bowman, female   DOB: [birth year], 37 y.o.   MRN: [id number]   H&P reviewed, no changes  gen NAD FHT 130-149's, mod var, +accel, category 1 toco Q 76min  GBBS prophylaxis with PCN AROM after PCN  Pitocin to induce

## 2018-10-09 NOTE — Progress Notes (Signed)
This note also relates to the following rows which could not be included: CBG Lab Component - View only - Cannot attach notes to extension rows  CBG repeated @ 1548.  Pt not symptomatic.  Repeated CBG was 75

## 2018-10-10 ENCOUNTER — Encounter (HOSPITAL_COMMUNITY)
Admission: AD | Disposition: A | Discharge status: Home / Self Care | Payer: Self-pay | Source: Home / Self Care | Attending: Obstetrics and Gynecology

## 2018-10-10 ENCOUNTER — Encounter (HOSPITAL_COMMUNITY): Payer: Self-pay | Admitting: *Deleted

## 2018-10-10 LAB — GLUCOSE, CAPILLARY
Glucose-Capillary: 97 mg/dL (ref 70–99)
Glucose-Capillary: 93 mg/dL (ref 70–99)
Glucose-Capillary: 82 mg/dL (ref 70–99)
Glucose-Capillary: 112 mg/dL — ABNORMAL HIGH (ref 70–99)

## 2018-10-10 SURGERY — Surgical Case
Anesthesia: Epidural | Wound class: Clean Contaminated

## 2018-10-10 MED ORDER — ZOLPIDEM TARTRATE 5 MG PO TABS: 5.0000 mg | ORAL_TABLET | Freq: Every evening | ORAL | Status: DC | PRN

## 2018-10-10 MED ORDER — SIMETHICONE 80 MG PO CHEW: 80.0000 mg | CHEWABLE_TABLET | ORAL | Status: DC | PRN

## 2018-10-10 MED ORDER — DIPHENHYDRAMINE HCL 25 MG PO CAPS
25.0000 mg | ORAL_CAPSULE | ORAL | Status: DC | PRN
  Administered 2018-10-11 (×2): 25 mg via ORAL
  Filled 2018-10-10 (×2): qty 1

## 2018-10-10 MED ORDER — FENTANYL CITRATE (PF) 100 MCG/2ML IJ SOLN: 25.0000 ug | INTRAMUSCULAR | Status: DC | PRN

## 2018-10-10 MED ORDER — HYDROXYZINE HCL 50 MG PO TABS
50.0000 mg | ORAL_TABLET | Freq: Every day | ORAL | Status: DC | PRN
  Administered 2018-10-11 – 2018-10-12 (×2): 50 mg via ORAL
  Filled 2018-10-10 (×5): qty 1

## 2018-10-10 MED ORDER — NALBUPHINE HCL 10 MG/ML IJ SOLN: 5.0000 mg | Freq: Once | INTRAMUSCULAR | Status: DC | PRN

## 2018-10-10 MED ORDER — INFLUENZA VAC SPLIT QUAD 0.5 ML IM SUSY: 0.5000 mL | PREFILLED_SYRINGE | INTRAMUSCULAR | Status: DC

## 2018-10-10 MED ORDER — LACTATED RINGERS IV SOLN
INTRAVENOUS | Status: DC
  Administered 2018-10-10 – 2018-10-11 (×2): via INTRAVENOUS

## 2018-10-10 MED ORDER — PANTOPRAZOLE SODIUM 40 MG PO TBEC
40.0000 mg | DELAYED_RELEASE_TABLET | Freq: Every day | ORAL | Status: DC
  Administered 2018-10-11 – 2018-10-14 (×4): 40 mg via ORAL
  Filled 2018-10-10 (×4): qty 1

## 2018-10-10 MED ORDER — QUETIAPINE FUMARATE 400 MG PO TABS
400.0000 mg | ORAL_TABLET | Freq: Every evening | ORAL | Status: DC | PRN
  Filled 2018-10-10: qty 1

## 2018-10-10 MED ORDER — SODIUM BICARBONATE 8.4 % IV SOLN
INTRAVENOUS | Status: DC | PRN
  Administered 2018-10-10 (×4): 5 mL via EPIDURAL

## 2018-10-10 MED ORDER — NALOXONE HCL 4 MG/10ML IJ SOLN
1.0000 ug/kg/h | INTRAVENOUS | Status: DC | PRN
  Filled 2018-10-10: qty 5

## 2018-10-10 MED ORDER — NALOXONE HCL 0.4 MG/ML IJ SOLN: 0.4000 mg | INTRAMUSCULAR | Status: DC | PRN

## 2018-10-10 MED ORDER — OXYTOCIN 10 UNIT/ML IJ SOLN
INTRAVENOUS | Status: DC | PRN
  Administered 2018-10-10: 40 [IU] via INTRAVENOUS

## 2018-10-10 MED ORDER — OXYCODONE HCL 5 MG PO TABS
5.0000 mg | ORAL_TABLET | ORAL | Status: DC | PRN
  Administered 2018-10-12 (×2): 5 mg via ORAL
  Administered 2018-10-13: 10 mg via ORAL
  Administered 2018-10-13: 5 mg via ORAL
  Administered 2018-10-14: 10 mg via ORAL
  Filled 2018-10-10 (×4): qty 1
  Filled 2018-10-10: qty 2
  Filled 2018-10-10: qty 1

## 2018-10-10 MED ORDER — TETANUS-DIPHTH-ACELL PERTUSSIS 5-2.5-18.5 LF-MCG/0.5 IM SUSP: 0.5000 mL | Freq: Once | INTRAMUSCULAR | Status: DC

## 2018-10-10 MED ORDER — SIMETHICONE 80 MG PO CHEW
80.0000 mg | CHEWABLE_TABLET | ORAL | Status: DC
  Administered 2018-10-11 – 2018-10-13 (×4): 80 mg via ORAL
  Filled 2018-10-10 (×4): qty 1

## 2018-10-10 MED ORDER — OXYTOCIN 10 UNIT/ML IJ SOLN
INTRAMUSCULAR | Status: AC
  Filled 2018-10-10: qty 4

## 2018-10-10 MED ORDER — SODIUM CHLORIDE 0.9 % IR SOLN
Status: DC | PRN
  Administered 2018-10-10: 1000 mL

## 2018-10-10 MED ORDER — KETOROLAC TROMETHAMINE 30 MG/ML IJ SOLN
30.0000 mg | Freq: Four times a day (QID) | INTRAMUSCULAR | Status: AC | PRN
  Administered 2018-10-10: 30 mg via INTRAMUSCULAR

## 2018-10-10 MED ORDER — ONDANSETRON HCL 4 MG/2ML IJ SOLN: 4.0000 mg | Freq: Three times a day (TID) | INTRAMUSCULAR | Status: DC | PRN

## 2018-10-10 MED ORDER — SODIUM CHLORIDE 0.9% FLUSH: 3.0000 mL | INTRAVENOUS | Status: DC | PRN

## 2018-10-10 MED ORDER — OXYTOCIN 40 UNITS IN LACTATED RINGERS INFUSION - SIMPLE MED
2.5000 [IU]/h | INTRAVENOUS | Status: AC
  Administered 2018-10-10: 2.5 [IU]/h via INTRAVENOUS

## 2018-10-10 MED ORDER — COCONUT OIL OIL
1.0000 "application " | TOPICAL_OIL | Status: DC | PRN
  Administered 2018-10-14: 1 via TOPICAL
  Filled 2018-10-10: qty 120

## 2018-10-10 MED ORDER — SCOPOLAMINE 1 MG/3DAYS TD PT72
MEDICATED_PATCH | TRANSDERMAL | Status: DC | PRN
  Administered 2018-10-10: 1 via TRANSDERMAL

## 2018-10-10 MED ORDER — SCOPOLAMINE 1 MG/3DAYS TD PT72
MEDICATED_PATCH | TRANSDERMAL | Status: AC
  Filled 2018-10-10: qty 1

## 2018-10-10 MED ORDER — DEXAMETHASONE SODIUM PHOSPHATE 10 MG/ML IJ SOLN
INTRAMUSCULAR | Status: AC
  Filled 2018-10-10: qty 1

## 2018-10-10 MED ORDER — SIMETHICONE 80 MG PO CHEW
80.0000 mg | CHEWABLE_TABLET | Freq: Three times a day (TID) | ORAL | Status: DC
  Administered 2018-10-10 – 2018-10-14 (×8): 80 mg via ORAL
  Filled 2018-10-10 (×10): qty 1

## 2018-10-10 MED ORDER — PRENATAL MULTIVITAMIN CH
1.0000 | ORAL_TABLET | Freq: Every day | ORAL | Status: DC
  Administered 2018-10-12 – 2018-10-13 (×2): 1 via ORAL
  Filled 2018-10-10 (×3): qty 1

## 2018-10-10 MED ORDER — CALCIUM CARBONATE ANTACID 500 MG PO CHEW
4.0000 | CHEWABLE_TABLET | Freq: Two times a day (BID) | ORAL | Status: DC | PRN
  Administered 2018-10-13: 800 mg via ORAL
  Filled 2018-10-10 (×2): qty 4

## 2018-10-10 MED ORDER — ONDANSETRON HCL 4 MG/2ML IJ SOLN
INTRAMUSCULAR | Status: DC | PRN
  Administered 2018-10-10: 4 mg via INTRAVENOUS

## 2018-10-10 MED ORDER — CEFAZOLIN SODIUM-DEXTROSE 2-3 GM-%(50ML) IV SOLR
INTRAVENOUS | Status: DC | PRN
  Administered 2018-10-10: 2 g via INTRAVENOUS

## 2018-10-10 MED ORDER — DEXAMETHASONE SODIUM PHOSPHATE 10 MG/ML IJ SOLN
INTRAMUSCULAR | Status: DC | PRN
  Administered 2018-10-10: 10 mg via INTRAVENOUS

## 2018-10-10 MED ORDER — NALBUPHINE HCL 10 MG/ML IJ SOLN: 5.0000 mg | INTRAMUSCULAR | Status: DC | PRN

## 2018-10-10 MED ORDER — MENTHOL 3 MG MT LOZG: 1.0000 | LOZENGE | OROMUCOSAL | Status: DC | PRN

## 2018-10-10 MED ORDER — LINACLOTIDE 145 MCG PO CAPS
145.0000 ug | ORAL_CAPSULE | Freq: Every day | ORAL | Status: DC | PRN
  Filled 2018-10-10: qty 1

## 2018-10-10 MED ORDER — MORPHINE SULFATE (PF) 0.5 MG/ML IJ SOLN
INTRAMUSCULAR | Status: AC
  Filled 2018-10-10: qty 10

## 2018-10-10 MED ORDER — DIBUCAINE 1 % RE OINT: 1.0000 "application " | TOPICAL_OINTMENT | RECTAL | Status: DC | PRN

## 2018-10-10 MED ORDER — DIPHENHYDRAMINE HCL 50 MG/ML IJ SOLN: 12.5000 mg | INTRAMUSCULAR | Status: DC | PRN

## 2018-10-10 MED ORDER — KETOROLAC TROMETHAMINE 30 MG/ML IJ SOLN
INTRAMUSCULAR | Status: AC
  Filled 2018-10-10: qty 1

## 2018-10-10 MED ORDER — MORPHINE SULFATE (PF) 0.5 MG/ML IJ SOLN
INTRAMUSCULAR | Status: DC | PRN
  Administered 2018-10-10: 3 mg via EPIDURAL

## 2018-10-10 MED ORDER — SENNOSIDES-DOCUSATE SODIUM 8.6-50 MG PO TABS
2.0000 | ORAL_TABLET | ORAL | Status: DC
  Administered 2018-10-11 – 2018-10-13 (×4): 2 via ORAL
  Filled 2018-10-10 (×4): qty 2

## 2018-10-10 MED ORDER — WITCH HAZEL-GLYCERIN EX PADS: 1.0000 "application " | MEDICATED_PAD | CUTANEOUS | Status: DC | PRN

## 2018-10-10 MED ORDER — KETOROLAC TROMETHAMINE 30 MG/ML IJ SOLN
30.0000 mg | Freq: Four times a day (QID) | INTRAMUSCULAR | Status: AC | PRN
  Administered 2018-10-10: 30 mg via INTRAVENOUS
  Filled 2018-10-10: qty 1

## 2018-10-10 MED ORDER — DIPHENHYDRAMINE HCL 25 MG PO CAPS
25.0000 mg | ORAL_CAPSULE | Freq: Four times a day (QID) | ORAL | Status: DC | PRN
  Filled 2018-10-10: qty 1

## 2018-10-10 MED ORDER — MEPERIDINE HCL 25 MG/ML IJ SOLN: 6.2500 mg | INTRAMUSCULAR | Status: DC | PRN

## 2018-10-10 MED ORDER — METFORMIN HCL 500 MG PO TABS
500.0000 mg | ORAL_TABLET | Freq: Two times a day (BID) | ORAL | Status: DC
  Administered 2018-10-10: 500 mg via ORAL
  Filled 2018-10-10 (×2): qty 1

## 2018-10-10 MED ORDER — ONDANSETRON HCL 4 MG/2ML IJ SOLN
INTRAMUSCULAR | Status: AC
  Filled 2018-10-10: qty 2

## 2018-10-10 MED ORDER — IBUPROFEN 800 MG PO TABS
800.0000 mg | ORAL_TABLET | Freq: Three times a day (TID) | ORAL | Status: DC
  Administered 2018-10-10 – 2018-10-14 (×10): 800 mg via ORAL
  Filled 2018-10-10 (×11): qty 1

## 2018-10-10 MED ORDER — METOCLOPRAMIDE HCL 5 MG/ML IJ SOLN
INTRAMUSCULAR | Status: DC | PRN
  Administered 2018-10-10: 10 mg via INTRAVENOUS

## 2018-10-10 SURGICAL SUPPLY — 36 items
APL SKNCLS STERI-STRIP NONHPOA (GAUZE/BANDAGES/DRESSINGS) ×1
BENZOIN TINCTURE PRP APPL 2/3 (GAUZE/BANDAGES/DRESSINGS) ×2 IMPLANT
CHLORAPREP W/TINT 26ML (MISCELLANEOUS) ×2 IMPLANT
CLAMP CORD UMBIL (MISCELLANEOUS) IMPLANT
CLOTH BEACON ORANGE TIMEOUT ST (SAFETY) ×2 IMPLANT
DRSG OPSITE POSTOP 4X10 (GAUZE/BANDAGES/DRESSINGS) ×2 IMPLANT
ELECT REM PT RETURN 9FT ADLT (ELECTROSURGICAL) ×2
ELECTRODE REM PT RTRN 9FT ADLT (ELECTROSURGICAL) ×1 IMPLANT
EXTRACTOR VACUUM KIWI (MISCELLANEOUS) ×1 IMPLANT
EXTRACTOR VACUUM M CUP 4 TUBE (SUCTIONS) IMPLANT
GLOVE BIO SURGEON STRL SZ 6.5 (GLOVE) ×2 IMPLANT
GLOVE BIOGEL PI IND STRL 7.0 (GLOVE) ×1 IMPLANT
GLOVE BIOGEL PI INDICATOR 7.0 (GLOVE) ×1
GOWN STRL REUS W/TWL LRG LVL3 (GOWN DISPOSABLE) ×4 IMPLANT
KIT ABG SYR 3ML LUER SLIP (SYRINGE) IMPLANT
NDL HYPO 25X5/8 SAFETYGLIDE (NEEDLE) IMPLANT
NEEDLE HYPO 25X5/8 SAFETYGLIDE (NEEDLE) IMPLANT
NS IRRIG 1000ML POUR BTL (IV SOLUTION) ×2 IMPLANT
PACK C SECTION WH (CUSTOM PROCEDURE TRAY) ×2 IMPLANT
PAD OB MATERNITY 4.3X12.25 (PERSONAL CARE ITEMS) ×2 IMPLANT
PENCIL SMOKE EVAC W/HOLSTER (ELECTROSURGICAL) ×2 IMPLANT
RTRCTR C-SECT PINK 25CM LRG (MISCELLANEOUS) ×2 IMPLANT
SPONGE LAP 18X18 RF (DISPOSABLE) ×6 IMPLANT
STRIP CLOSURE SKIN 1/2X4 (GAUZE/BANDAGES/DRESSINGS) ×2 IMPLANT
SUT MNCRL 0 VIOLET CTX 36 (SUTURE) ×2 IMPLANT
SUT MONOCRYL 0 CTX 36 (SUTURE) ×2
SUT PLAIN 1 NONE 54 (SUTURE) IMPLANT
SUT PLAIN 2 0 XLH (SUTURE) ×2 IMPLANT
SUT VIC AB 0 CT1 27 (SUTURE) ×4
SUT VIC AB 0 CT1 27XBRD ANBCTR (SUTURE) ×2 IMPLANT
SUT VIC AB 2-0 CT1 27 (SUTURE) ×2
SUT VIC AB 2-0 CT1 TAPERPNT 27 (SUTURE) ×1 IMPLANT
SUT VIC AB 4-0 KS 27 (SUTURE) ×2 IMPLANT
SYR BULB IRRIGATION 50ML (SYRINGE) ×2 IMPLANT
TOWEL OR 17X24 6PK STRL BLUE (TOWEL DISPOSABLE) ×2 IMPLANT
TRAY FOLEY W/BAG SLVR 14FR LF (SET/KITS/TRAYS/PACK) ×2 IMPLANT

## 2018-10-10 NOTE — Lactation Note (Signed)
This note was copied from a baby's chart. Lactation Consultation Note  Patient Name: Heather Bowman Date: 10/10/2018 Reason for consult: Initial assessment;Term;NICU baby;1st time breastfeeding;Primapara;Maternal endocrine disorder Type of Endocrine Disorder?: PCOS  P1 mother whose infant is now 74 hours old.  This is an ETI at 39+3 weeks and was in the NICU for observation.  Per RN, he will be returning to the mother's room very shortly.  Mother has a history of PCOS.  Discussed breast feeding basics with mother.  She has not had any breast feeding classes and will benefit from review and reinforcement.  Offered to initiate DEBP to help increase milk supply and mother accepted.  Reviewed pump parts, assembly, disassembly and cleaning of parts.  Father in room and explained how he could help mother.    Encouraged mother to pump 8-12 times in 24 hours.  When baby returns to mother's room he will be allowed to breast feed whenever he shows feeding cues, or at least every 3 hours.  Mother shown hand expression and colostrum container provided for any EBM she obtains with pumping or hand expression.  Mother stated that she may want to continue pumping in place of him nursing.  Mother worried that she may not have "enough milk" for her baby.  I educated further regarding feeding, milk coming to volume, latching and getting latching assistance and observing the baby.  Reinforced that her RN/LC will help her make appropriate feeding choices and will assist with latching as needed.    Mom made aware of O/P services, breastfeeding support groups, community resources, and our phone # for post-discharge questions.   Father present and supportive.  Mother encouraged to ask for assistance as needed.  RN updated.   Maternal Data Formula Feeding for Exclusion: No Has patient been taught Hand Expression?: Yes Does the patient have breastfeeding experience prior to this delivery?: No  Feeding Feeding  Type: Formula Nipple Type: Slow - flow  LATCH Score                   Interventions    Lactation Tools Discussed/Used WIC Program: Yes Pump Review: Setup, frequency, and cleaning;Milk Storage Initiated by:: Rihan Schueler Date initiated:: 10/10/18   Consult Status Consult Status: Follow-up Date: 10/11/18 Follow-up type: In-patient    Little Ishikawa 10/10/2018, 2:43 PM

## 2018-10-10 NOTE — Progress Notes (Signed)
Dr. Melba Coon at bedside counseling patient on risks/benefits of primary cesarean section for arrest of descent. All questions answered. Consent signed. OR staff notified. Raquel Sarna Quavon Keisling RN

## 2018-10-10 NOTE — Anesthesia Postprocedure Evaluation (Signed)
Anesthesia Post Note  Patient: Heather Bowman  Procedure(s) Performed: CESAREAN SECTION (N/A )     Patient location during evaluation: PACU Anesthesia Type: Epidural Level of consciousness: awake and alert and oriented Pain management: pain level controlled Vital Signs Assessment: post-procedure vital signs reviewed and stable Respiratory status: spontaneous breathing, nonlabored ventilation and respiratory function stable Cardiovascular status: stable and blood pressure returned to baseline Postop Assessment: no headache, no backache, epidural receding, patient able to bend at knees and no apparent nausea or vomiting Anesthetic complications: no    Pain Goal:                 Ej Pinson A.

## 2018-10-10 NOTE — Progress Notes (Signed)
CBG 112 in PACU

## 2018-10-10 NOTE — Progress Notes (Signed)
Patient ID: Heather Bowman, female   DOB: 11-16-1980, 37 y.o.   MRN: [id number]   Pt reached C/C/+2  Labored down and pushed for 2+ hours with minimal descent  D/w pt and FOB r/b/a of LTCS including but not limited to bleeding, infection, damage to surrounding organs, injury to infant and trouble with healing,  Questions answered,, wwill proceed  120's, mod var, category 1 toco q2-9min

## 2018-10-10 NOTE — Transfer of Care (Signed)
Immediate Anesthesia Transfer of Care Note  Patient: Heather Bowman  Procedure(s) Performed: CESAREAN SECTION (N/A )  Patient Location: PACU  Anesthesia Type:Epidural  Level of Consciousness: awake, alert  and sedated  Airway & Oxygen Therapy: Patient Spontanous Breathing  Post-op Assessment: Report given to RN and Post -op Vital signs reviewed and stable  Post vital signs: Reviewed and stable  Last Vitals:  Vitals Value Taken Time  BP    Temp    Pulse    Resp    SpO2      Last Pain:  Vitals:   10/10/18 0540  TempSrc: Oral  PainSc:          Complications: No apparent anesthesia complications

## 2018-10-10 NOTE — Brief Op Note (Addendum)
10/10/2018  7:16 AM  PATIENT:  Leamon Arnt  37 y.o. female  PRE-OPERATIVE DIAGNOSIS:  Arrest of descent  POST-OPERATIVE DIAGNOSIS:  Arrest of descent  PROCEDURE:  Procedure(s): CESAREAN SECTION (N/A)  SURGEON:  Surgeon(s) and Role:    * Bovard-Stuckert, Lakina Mcintire, MD - Primary  ANESTHESIA:   epidural  EBL:  904 mL IVF and uop per anesthesia, clear uop  FINDINGS: viable female infant at 6:27, apgars 8/9, wt unofficially 10#1; nl uterus, tubes and ovaries  BLOOD ADMINISTERED:none  DRAINS: Urinary Catheter (Foley)   LOCAL MEDICATIONS USED:  NONE  SPECIMEN:  Source of Specimen:  Placenta  DISPOSITION OF SPECIMEN:  L&D  COUNTS:  YES  TOURNIQUET:  * No tourniquets in log *  DICTATION: .Other Dictation: Dictation Number [id number]  PLAN OF CARE: Admit to inpatient   PATIENT DISPOSITION:  PACU - hemodynamically stable.   Delay start of Pharmacological VTE agent (>24hrs) due to surgical blood loss or risk of bleeding: yes

## 2018-10-10 NOTE — Progress Notes (Signed)
Patient ID: Heather Bowman, female   DOB: October 31, 1981, 37 y.o.   MRN: [id number] POD #0 Comfortable and tolerating po  All BS WNL in labor so have not been ordered pp

## 2018-10-10 NOTE — Op Note (Signed)
NAMESELA, Heather Bowman MEDICAL RECORD [id number] ACCOUNT [id number] DATE OF BIRTH:[birth year] FACILITY: [facility] LOCATION: [facility] PHYSICIAN:Darlen Gledhill BOVARD-STUCKERT, MD  OPERATIVE REPORT  DATE OF PROCEDURE:  10/10/2018  PREOPERATIVE DIAGNOSES:  Intrauterine pregnancy at term with gestational diabetes, arrest of descent.  POSTOPERATIVE DIAGNOSES:  Intrauterine pregnancy at term with gestational diabetes, arrest of descent, delivered.  PROCEDURE:  Primary low transverse cesarean section.  SURGEON:  Janyth Contes, MD  ANESTHESIA:  Epidural. ESTIMATED BLOOD LOSS:  940 mL.  URINE OUTPUT AND INTRAVENOUS FLUIDS:  Per Anesthesia.  Clear urine output at the end of the procedure.  COMPLICATIONS:  None.  PATHOLOGY:  Placenta to labor and delivery.  DESCRIPTION OF PROCEDURE:  After informed consent was reviewed with the patient including risks, benefits and alternatives of surgical procedure, she was transferred into the operating room and placed on the table in supine position with a leftward tilt.   Her epidural was redosed and found to be adequate.  She was then prepped and draped in the normal sterile fashion.  A Foley catheter had been previously placed.  A Pfannenstiel skin incision was made at the level of approximately 2 fingerbreadths above  her pubic symphysis, carried through the underlying layer of fascia sharply.  The fascia was incised in the midline.  The incision was extended laterally with Mayo scissors.  Superior aspect of the fascial incision was grasped with Kocher clamps,  elevated and the rectus muscles were dissected off both bluntly and sharply.  Midline was easily identified and the peritoneum was entered bluntly.  Incision was extended superiorly and inferiorly with good visualization of the bladder.  The Alexis skin  retractor was placed carefully making sure that no bowel was entrapped.  The uterus was explored, as mentioned, incised in transverse fashion.  Infant  was delivered with aid of a vacuum from vertex presentation.  Nose and mouth were suctioned on the  field.  Cord was clamped and cut.  There was some difficulty getting the shoulders delivered.  The incision was minimally extended to affect delivery.  The infant was dried and handed off to the waiting pediatric staff.  Cord was clamped and cut, which  was awaited a minute.  The placenta was expressed from the uterus.  The uterus was cleared of all clot and debris.  Uterine incision was closed with 2 layers of 0 Monocryl, the first of which was running locked and the second as an imbricating layer.   The gutters were cleared of all clot and debris.  The peritoneum was reapproximated using 2-0 Vicryl.  The subfascial planes were inspected and found to be hemostatic.  The fascia was then closed with 0 Vicryl in a single suture.  The subcuticular  adipose layer was made hemostatic with Bovie cautery.  The dead space was closed with plain gut.  Skin was closed with 4-0 Vicryl in a subcuticular fashion.    The patient tolerated the procedure well.  Sponge, lap and needle counts were correct x2 per the operating room staff.  AN/NUANCE  D:10/10/2018 T:10/10/2018 JOB:[id number]

## 2018-10-11 LAB — CBC
HCT: 29.3 % — ABNORMAL LOW (ref 36.0–46.0)
Hemoglobin: 9.3 g/dL — ABNORMAL LOW (ref 12.0–15.0)
MCH: 26.5 pg (ref 26.0–34.0)
MCHC: 31.7 g/dL (ref 30.0–36.0)
MCV: 83.5 fL (ref 80.0–100.0)
Platelets: 178 10*3/uL (ref 150–400)
RBC: 3.51 MIL/uL — ABNORMAL LOW (ref 3.87–5.11)
RDW: 22.2 % — ABNORMAL HIGH (ref 11.5–15.5)
WBC: 15.5 10*3/uL — ABNORMAL HIGH (ref 4.0–10.5)
nRBC: 0 % (ref 0.0–0.2)

## 2018-10-11 MED ORDER — RHO D IMMUNE GLOBULIN 1500 UNIT/2ML IJ SOSY
300.0000 ug | PREFILLED_SYRINGE | Freq: Once | INTRAMUSCULAR | Status: AC
  Administered 2018-10-11: 300 ug via INTRAVENOUS
  Filled 2018-10-11: qty 2

## 2018-10-11 NOTE — Progress Notes (Signed)
Prenatal counseling for impending newborn done--1st child, currently 38-39wks, h/o placenta previa, gest DM on metformin, prenatal care early Z76.81

## 2018-10-11 NOTE — Anesthesia Postprocedure Evaluation (Signed)
Anesthesia Post Note  Patient: Heather Bowman  Procedure(s) Performed: CESAREAN SECTION (N/A )     Patient location during evaluation: Mother Baby Anesthesia Type: Epidural Level of consciousness: awake, awake and alert and oriented Pain management: pain level controlled Vital Signs Assessment: post-procedure vital signs reviewed and stable Respiratory status: spontaneous breathing and respiratory function stable Cardiovascular status: blood pressure returned to baseline Postop Assessment: no headache, epidural receding, patient able to bend at knees, adequate PO intake, no backache, no apparent nausea or vomiting and able to ambulate Anesthetic complications: no    Last Vitals:  Vitals:   10/11/18 0000 10/11/18 0400  BP: 123/69 102/60  Pulse: 95 99  Resp: 18 18  Temp: 36.4 C 36.8 C  SpO2: 97% 99%    Last Pain:  Vitals:   10/11/18 0515  TempSrc:   PainSc: 2    Pain Goal:                 Bufford Spikes

## 2018-10-11 NOTE — Progress Notes (Signed)
This RN assumed care for this patient at 1420. Previous RN planned to give Rhogam injection at 1330, but pt was not in her room. This RN tried to see the patient multiple times but pt was not in her room, she was visiting her baby in the NICU. RN called the NICU and spoke to the patient personally. RN informed the pt that I had medications to give her, pt stated "I know" and said she would be visiting baby for a while. RN told pt to call when she returns from the NICU so I can give her Rhogam.   Gearldine Bienenstock, RN 10/11/2018 5:20 PM

## 2018-10-11 NOTE — Progress Notes (Signed)
Subjective: Postpartum Day #1: Cesarean Delivery Patient reports incisional pain, tolerating PO and no problems voiding.    Objective: Vital signs in last 24 hours: Temp:  [97.6 F (36.4 C)-98.9 F (37.2 C)] 98.2 F (36.8 C) (12/11 0400) Pulse Rate:  [95-110] 99 (12/11 0400) Resp:  [14-20] 18 (12/11 0400) BP: (102-140)/(60-79) 102/60 (12/11 0400) SpO2:  [97 %-100 %] 99 % (12/11 0400)  Physical Exam:  General: alert Lochia: appropriate Uterine Fundus: firm Incision: dressing C/D/I   Recent Labs    10/09/18 0815 10/11/18 0517  HGB 14.0 9.3*  HCT 43.4 29.3*    Assessment/Plan: Status post Cesarean section. Doing well postoperatively.  Continue current care, ambulate.  She was on Metformin prior to pregnancy, all CBG in labor were normal or even low, none ordered postpartum.  I have stopped her Metformin for now, can restart when she goes home.  Sherren Mocha D Socrates Cahoon 10/11/2018, 8:26 AM

## 2018-10-11 NOTE — Progress Notes (Signed)
CSW acknowledged consult and attempted to meet with [name] in room 130.  When CSW arrived, [name]'s room was empty.  CSW spoke with bedside nurse and was informed that [name] was visiting infant in NICU.    CSW will attempt to visit with [name] at a later time.   Laurey Arrow, MSW, LCSW Clinical Social Work [phone]

## 2018-10-11 NOTE — Progress Notes (Signed)
CSW attempted to meet with [name] again in room 130 and [name] was not present.  CSW met with [name] and FOB at infant's bedside.  [name] agreed to meet with CSW on tomorrow (10/12/18) in [name]'s room to discuss [name]'s Edinburgh score.   Laurey Arrow, MSW, LCSW Clinical Social Work [phone]

## 2018-10-12 ENCOUNTER — Other Ambulatory Visit: Payer: Self-pay

## 2018-10-12 LAB — GLUCOSE, CAPILLARY
GLUCOSE-CAPILLARY: 183 mg/dL — AB (ref 70–99)
GLUCOSE-CAPILLARY: 58 mg/dL — AB (ref 70–99)
Glucose-Capillary: 58 mg/dL — ABNORMAL LOW (ref 70–99)
Glucose-Capillary: 67 mg/dL — ABNORMAL LOW (ref 70–99)
Glucose-Capillary: 101 mg/dL — ABNORMAL HIGH (ref 70–99)
Glucose-Capillary: 84 mg/dL (ref 70–99)

## 2018-10-12 LAB — RH IG WORKUP (INCLUDES ABO/RH)
ABO/RH(D): AB NEG
Fetal Screen: NEGATIVE
Gestational Age(Wks): 39.3
Unit division: 0

## 2018-10-12 NOTE — Lactation Note (Addendum)
This note was copied from a baby's chart. Lactation Consultation Note  Patient Name: Heather Bowman TFTDD'[id number] Date: 10/12/2018 Reason for consult: Follow-up assessment;Primapara;1st time breastfeeding;Maternal endocrine disorder;NICU baby Type of Endocrine Disorder?: PCOS  Baby is 5 hours old  Annetta [place] visited mom  - room 92  Perm om has only pumped x 2 since the baby was transferred to NICU yesterday.  LC reviewed supply and demand and the importance of consistent pumping 8-10 x's a day To establish and protect milk supply. LC reminded the volume gradually increases.  LC reviewed the set up of the DEBP and 2 phases on the DEBP.  Mom denies sore nipples and engorgement prevention and tx.  [place] Faxed a DEBP request to [facility] [place] and left a note on the [place] rep station.  Reviewed storage of EBM for NICU and the when she goes home.  Mother informed of post-discharge support and given phone number to the lactation department, including services for phone call assistance; out-patient appointments; and breastfeeding support group. List of other breastfeeding resources in the community given in the handout. Encouraged mother to call for problems or concerns related to breastfeeding. LC spoke to [facility] rep and mom is a partial packet ( breast/ formula )  LC informed WIC rep baby was transferred to NICU and will need a DEBP and mom is willing  To change her nutrition packet to Breastfeeding so she can get a DEBP.  Mom is not going home today.    Maternal Data Has patient been taught Hand Expression?: Yes(per mom - LC recommended hand expressing prior to pumping and afterwards to enhance let down )  Feeding Feeding Type: Formula  LATCH Score                   Interventions Interventions: Breast feeding basics reviewed;DEBP  Lactation Tools Discussed/Used Tools: Pump(per mom has pumped x 2 in the last 24 hours since baby was NICU ) Breast pump type: Double-Electric Breast Pump WIC  Program: Yes(per mom active - [place] faxed a request for DEBP ) Pump Review: Setup, frequency, and cleaning;Milk Storage Initiated by:: MAI / reviewed  Date initiated:: 10/12/18   Consult Status Consult Status: Follow-up Follow-up type: Other (comment)(baby in NICU )    Myer Haff 10/12/2018, 9:52 AM

## 2018-10-12 NOTE — Progress Notes (Signed)
Hypoglycemic Event  CBG: 58  Treatment: 4 oz apple juice, 1 pack of graham crackers, ordered a meal.  Symptoms: "feels like she needs to eat"  Follow-up CBG: Time:1548 CBG Result:58  Patient drank 4 more ounces of apple juice, 1 pack of graham crackers and  1 oz peanut butter, meal tray just arrived.

## 2018-10-12 NOTE — Clinical Social Work Maternal (Signed)
CLINICAL SOCIAL WORK MATERNAL/CHILD NOTE  Patient Details  Name: Avalon Coppinger MRN: [id number] Date of Birth: Jun 05, 1981  Date:  10/12/2018  Clinical Social Worker Initiating Note:  Abundio Miu, [place] Date/Time: Initiated:  10/12/18/1513     Child's Name:  Myrle Sheng    Biological Parents:  Mother, Father(Father: Georgia Lopes)   Need for Interpreter:  None   Reason for Referral:  Behavioral Health Concerns, Other (Comment)(Edinburgh score 23;;; NICU admission)   Address:  [place] [address]    Phone number:  [phone] (home)     Additional phone number:   Household Members/Support Persons (HM/SP):   Household Member/Support Person 1, Household Member/Support Person 2   HM/SP Name Relationship DOB or Age  HM/SP -[place] sister    HM/SP -[place] niece 05-13-14  HM/SP -3        HM/SP -4        HM/SP -5        HM/SP -6        HM/SP -7        HM/SP -8          Natural Supports (not living in the home):  Extended Family   Professional Supports: None   Employment: Unemployed([name] is working on getting disability;; upcoming hearing in January )   Type of Work:     Education:  Herbalist)   [place] arranged:    Museum/gallery curator Resources:  Medicaid   Other Resources:  [employer], Physicist, medical    Cultural/Religious Considerations Which May Impact Care:    Strengths:  Ability to meet basic needs , Home prepared for child , Psychotropic Medications, Pediatrician chosen   Psychotropic Medications:  Seroquel      Pediatrician:    [place] area  Pediatrician List:   [facility]      Pediatrician Fax Number:    Risk Factors/Current Problems:  Mental Health Concerns    Cognitive State:  Able to Concentrate , Goal Oriented , Linear Thinking , Alert    Mood/Affect:  Interested , Comfortable ,  Overwhelmed , Agitated    CSW Assessment: CSW spoke with [name] at bedside regarding consult for edinburgh score 23 and NICU admission. [name] was welcoming and engaged throughout assessment. CSW and [name] discussed at length her experience at the hospital and her concerns. CSW and [name] processed her feelings surrounding her experience. [name] verbalized that she didn't feel well informed about her infant's care and treatment in the NICU. [name] reported that she wants to be more informed about his care so she can make the best decisions. CSW validated [name]'s feelings and informed about the NICU and NICU rounds so she can get a full update on infant's care.   [name]'s attending MD came in during assessment to see [name].  CSW continued assessment after MD left the room. [name] also shared her experience with a staff member that she reported provided misinformation and when she let the staff member know, staff had an attitude with her. [name] reported that she felt uncomfortable with staff having an attitude with her and caring for her baby. CSW acknowledged [name]'s feelings and provided emotional support. CSW reminded [name] that it is a new day and that everyone is here to support her and her infant. [name] talked about her lack of sleep due to events that  have took place. CSW encouraged [name] to get some rest so that she is able to care for and support baby.    CSW and [name] discussed edinburgh score 23. CSW inquired about [name]'s mental health history, [name] reported that she was diagnosed with anxiety and depression in 2013 after experiencing a work related injury. [name] shared that she is currently working to get disability. CSW inquired about [name]'s anxiety/depression symptoms during her pregnancy. [name] reported that she would isolate herself from others and sometimes she felt anxious.  CSW inquired about [name]'s coping skills, [name] reported that she has cut some people off, goes to counseling and is taking medication. [name] reported that she is  currently taking/prescribed Seroquel as needed and that it is managed by [facility] and [facility]. [name] reported that she sees her counselor at least once a month and sometimes more if needed. [name] reported that her next appointment is January 9th and that she is planning to see her psychiatrist soon to discuss her medication regimen now that she is no longer pregnant. CSW encouraged [name] to follow up as soon as possible.  CSW assessed for safety, [name] denied SI, HI and domestic violence.   CSW provided education regarding the baby blues period vs. perinatal mood disorders, discussed treatment and gave resources for mental health follow up if concerns arise.  CSW recommends self-evaluation during the postpartum time period using the New Mom Checklist from Postpartum Progress and encouraged [name] to contact a medical professional if symptoms are noted at any time.    CSW provided review of Sudden Infant Death Syndrome (SIDS) precautions.    [name] presented calm and became slightly agitated when recalling her experience at the hospital, [place] apologized for her experience and offered support.   CSW identifies no further need for intervention and no barriers to discharge at this time.   CSW Plan/Description:  No Further Intervention Required/No Barriers to Discharge, Sudden Infant Death Syndrome (SIDS) Education, Perinatal Mood and Anxiety Disorder (PMADs) Education    Burnis Medin, LCSW 10/12/2018, 3:19 PM

## 2018-10-12 NOTE — Progress Notes (Signed)
Hypoglycemic Event  CBG: 67  Treatment: eating meal tray (cheeseburger, french fries, brownie)  Follow-up CBG: Time 1642 CBG Result:101  Possible Reasons for Event: Unsure

## 2018-10-12 NOTE — Progress Notes (Signed)
Pt had states she hasn't eaten since breakfast. Completed CBG. Patient now states she had some cookies and soda. Will recheck cbg before next meal.

## 2018-10-12 NOTE — Progress Notes (Signed)
Patient ID: Heather Bowman, female   DOB: 03-14-1981, 37 y.o.   MRN: [id number] Pt is upset. Reports feels like lack of communication with staff regarding care of her son yesterday. States lack of information is affecting her being able to make informed decisions about care options when presented to her. Speaking with CSW at this time She does deny HA, CP. She is ambulating well. Has soreness at incision site. Voiding well. Has not slept much VSS FSBS all wnl ABD incision c/d/i, soft, FF EXT - +1 edema, no homans  12/11: 15.5>9.3<178  A/P: POD #2 s/p primary c/s for arrest of descent - stable         Routine pp/post op care         Encourage rest         No meds at this time for FSBS - will continue to monitor          Abdominal binder

## 2018-10-13 LAB — BPAM RBC
Blood Product Expiration Date: 202001012359
Blood Product Expiration Date: 202001052359
UNIT TYPE AND RH: 1700
Unit Type and Rh: 9500

## 2018-10-13 LAB — TYPE AND SCREEN
ABO/RH(D): AB NEG
Antibody Screen: POSITIVE
Unit division: 0
Unit division: 0

## 2018-10-13 LAB — GLUCOSE, CAPILLARY
GLUCOSE-CAPILLARY: 98 mg/dL (ref 70–99)
Glucose-Capillary: 88 mg/dL (ref 70–99)
Glucose-Capillary: 149 mg/dL — ABNORMAL HIGH (ref 70–99)
Glucose-Capillary: 93 mg/dL (ref 70–99)

## 2018-10-13 NOTE — Progress Notes (Addendum)
Subjective: Postpartum Day 3: Cesarean Delivery Patient reports tolerating PO and no problems voiding.  Pain is managed with po meds.  She is attempting to pump but not sure it is going well.  She has lots of questions about NICU visitation and how to communicate with NICU team.  Would like one more day to be close to baby for attempts at pumping and visiting.  Objective: Vital signs in last 24 hours: Temp:  [97.4 F (36.3 C)-99 F (37.2 C)] 99 F (37.2 C) (12/12 2156) Pulse Rate:  [96-98] 96 (12/12 2156) Resp:  [16-20] 20 (12/12 2156) BP: (129-134)/(66-87) 134/87 (12/12 2156)  Physical Exam:  General: alert and cooperative Lochia: appropriate Uterine Fundus: firm Incision: C/D/I Ext: 2+ edema  Recent Labs    10/11/18 0517  HGB 9.3*  HCT 29.3*    Assessment/Plan: Status post Cesarean section. Doing well postoperatively.  Plan d/c tomorrow to allow pt to work on pumping and visit baby in NICU BS stable off meds/lowest [place] 10/13/2018, 9:12 AM

## 2018-10-13 NOTE — Progress Notes (Signed)
CBG 149 Pt states just finished breakfast tray 1 hour ago.

## 2018-10-14 ENCOUNTER — Inpatient Hospital Stay: Admission: AD | Admit: 2018-10-14 | Payer: Medicaid Other | Source: Home / Self Care

## 2018-10-14 LAB — GLUCOSE, CAPILLARY: GLUCOSE-CAPILLARY: 96 mg/dL (ref 70–99)

## 2018-10-14 MED ORDER — OXYCODONE HCL 5 MG PO TABS: 5.0000 mg | ORAL_TABLET | ORAL | 0 refills | Status: DC | PRN

## 2018-10-14 MED ORDER — IBUPROFEN 800 MG PO TABS: 800.0000 mg | ORAL_TABLET | Freq: Three times a day (TID) | ORAL | 0 refills | Status: DC

## 2018-10-14 NOTE — Lactation Note (Signed)
This note was copied from a baby's chart. Lactation Consultation Note  Patient Name: Heather Bowman VOJJK'K Date: 10/14/2018 Reason for consult: Follow-up assessment;NICU baby;Term;Other (Comment)(DEBP set up - per mom has pump-ed x3 in the last 24 - with 1 tsp results ) Type of Endocrine Disorder?: PCOS  Baby is in NICU - 61 days old / term  LC visited mom in her room / mom requested to have a DEBP review and have the flanges checked. LC reviewed both phases of the DEBP and on the right mom needs to decrease back to #24 F and on the left keep at #27 .  Per mom has only pumped x 3 in the last 24 hours and not this am.  LC reviewed the importance of the 1st 2 weeks of breast feeding/ pumping to establish and protect  Milk supply. LC recommended since mom hasn't been pumping 8 - 10 times a day to add  Power pumping once a day for 60 mins ( 10 mins on / off for 60 mins or 20 mins on/ 10 mins off  For 60 mins ) for extra stimulation. When visiting baby in [place] on pumping after STS or feeding baby. Per mom has not been able to breast feed since the baby's respirations have not been consistently below 70.  Mom denies sore nipples. Sore nipple and engorgement prevention and tx reviewed.  [place] asked the [facility] to obtain coconut oil for mom as preventive.  Mom has DEBP WIC from [facility] ( [place] ) Friday, dad took it to the car.  Storage of breast milk reviewed for NICU and when mom takes baby home.  Mother informed of post-discharge support and given phone number to the lactation department, including services for phone call assistance; out-patient appointments; and breastfeeding support group. List of other breastfeeding resources in the community given in the handout. Encouraged mother to call for problems or concerns related to breastfeeding. Mom aware she can ask the RN to call [facility] when the baby goes to the breast.     Maternal Data Has patient been taught Hand Expression?: Yes(reviewed  )  Feeding Feeding Type: Formula  LATCH Score                   Interventions Interventions: Breast feeding basics reviewed;DEBP  Lactation Tools Discussed/Used Tools: Pump;Medicine Dropper WIC Program: Yes(obtained a DEBP from [facility] yesterday ) Pump Review: Setup, frequency, and cleaning;Milk Storage Initiated by:: reviewed - MAI - mom requested  Date initiated:: 10/14/18   Consult Status Consult Status: PRN Date: ( baby a patient in NICU ) Follow-up type: Other (comment)(NICU )    Jerlyn Ly Noriah Osgood 10/14/2018, 10:50 AM

## 2018-10-14 NOTE — Progress Notes (Signed)
Subjective: Postpartum Day 4: Cesarean Delivery Patient reports tolerating PO and no problems voiding.  Pain controlled with meds  Objective: Vital signs in last 24 hours: Temp:  [97.7 F (36.5 C)-98.8 F (37.1 C)] 98.2 F (36.8 C) (12/14 0409) Pulse Rate:  [92-98] 98 (12/14 0409) Resp:  [18] 18 (12/14 0409) BP: (115-145)/(79-88) 131/88 (12/14 0409) SpO2:  [99 %-100 %] 99 % (12/14 0409)  Physical Exam:  General: alert and cooperative Lochia: appropriate Uterine Fundus: firm Incision: C/D/I   No results for input(s): HGB, HCT in the last 72 hours.  Assessment/Plan: Status post Cesarean section. Doing well postoperatively.  Discharge home with standard precautions and return to clinic in 2 weeks.  [name] 10/14/2018, 10:54 AM

## 2018-10-14 NOTE — Discharge Summary (Addendum)
OB Discharge Summary     Patient Name: Heather Bowman DOB: October 28, 1981 MRN: [id number]  Date of admission: 10/09/2018 Delivering MD: Janyth Contes   Date of discharge: 10/14/2018  Admitting diagnosis: 11WKS INDUCTION Intrauterine pregnancy: [redacted]w[redacted]d     Secondary diagnosis:  Active Problems:   Pregnancy and non-insulin-dependent diabetes mellitus in third trimester  Additional problems: GBS positive     Discharge diagnosis: Term Pregnancy Delivered                                                                                                Post partum procedures:none  Augmentation: AROM and Pitocin  Complications: None  Hospital course:  Induction of Labor With Cesarean Section  37 y.o. yo G1P1001 at [redacted]w[redacted]d was admitted to the hospital 10/09/2018 for induction of labor. Patient had a labor course significant for arrest of descent. The patient went for cesarean section due to Arrest of Descent, and delivered a Viable infant,10/10/2018  Membrane Rupture Time/Date: 4:35 PM ,10/09/2018   Details of operation can be found in separate operative Note.  Patient had some low blood sugars postpartum on reduced metformin so this was discontinued.  She otherwise had an uncomplicated postpartum course. She is ambulating, tolerating a regular diet, passing flatus, and urinating well.  Patient is discharged home in stable condition on 10/14/18.                                    Physical exam  Vitals:   10/13/18 0743 10/13/18 1430 10/13/18 2154 10/14/18 0409  BP: 132/82 115/84 (!) 145/79 131/88  Pulse: 98 92 96 98  Resp: 18 18 18 18   Temp: 98.3 F (36.8 C) 98.8 F (37.1 C) 97.7 F (36.5 C) 98.2 F (36.8 C)  TempSrc: Oral Oral Oral Oral  SpO2:  99% 100% 99%  Weight:      Height:       General: alert and cooperative Lochia: appropriate Uterine Fundus: firm Incision: Dressing is clean, dry, and intact DVT Evaluation: No evidence of DVT seen on physical exam.  1+edema  bilaterally  Labs: Lab Results  Component Value Date   WBC 15.5 (H) 10/11/2018   HGB 9.3 (L) 10/11/2018   HCT 29.3 (L) 10/11/2018   MCV 83.5 10/11/2018   PLT 178 10/11/2018   CMP Latest Ref Rng & Units 08/01/2017  Glucose 70 - 99 mg/dL 98  BUN 6 - 23 mg/dL 13  Creatinine 0.40 - 1.20 mg/dL 0.92  Sodium 135 - 145 mEq/L 138  Potassium 3.5 - 5.1 mEq/L 4.4  Chloride 96 - 112 mEq/L 105  CO2 19 - 32 mEq/L 28  Calcium 8.4 - 10.5 mg/dL 9.6  Total Protein 6.0 - 8.3 g/dL 7.3  Total Bilirubin 0.2 - 1.2 mg/dL 0.4  Alkaline Phos 39 - 117 U/L 38(L)  AST 0 - 37 U/L 11  ALT 0 - 35 U/L 8    Discharge instruction: per After Visit Summary and "Baby and Me Booklet".  After visit meds:  Allergies as of 10/14/2018  No Known Allergies     Medication List    STOP taking these medications   ferrous sulfate 325 (65 FE) MG EC tablet   metFORMIN 500 MG tablet Commonly known as:  GLUCOPHAGE   metoCLOPramide 10 MG tablet Commonly known as:  REGLAN   pantoprazole 40 MG tablet Commonly known as:  PROTONIX   PRENATAL GUMMIES/DHA & FA PO   prenatal multivitamin Tabs tablet     TAKE these medications   calcium carbonate 500 MG chewable tablet Commonly known as:  TUMS - dosed in mg elemental calcium Chew 4 tablets by mouth 2 (two) times daily as needed for indigestion or heartburn.   hydrOXYzine 50 MG tablet Commonly known as:  ATARAX/VISTARIL Take 50 mg by mouth daily as needed for itching.   ibuprofen 800 MG tablet Commonly known as:  ADVIL,MOTRIN Take 1 tablet (800 mg total) by mouth 3 (three) times daily.   LINZESS 145 MCG Caps capsule Generic drug:  linaclotide Take 1 tablet by mouth daily as needed (constipation).   oxyCODONE 5 MG immediate release tablet Commonly known as:  Oxy IR/ROXICODONE Take 1-2 tablets (5-10 mg total) by mouth every 4 (four) hours as needed for moderate pain.   QUEtiapine 200 MG tablet Commonly known as:  SEROQUEL Take 400 mg by mouth at bedtime as  needed (sleep).   valACYclovir 500 MG tablet Commonly known as:  VALTREX Take 500 mg by mouth 2 (two) times daily as needed (outbreaks).       Diet: carb modified diet  Activity: Advance as tolerated. Pelvic rest for 6 weeks.   Outpatient follow up:2 weeks Follow up Appt:No future appointments. Follow up Visit:No follow-ups on file.  Postpartum contraception: Undecided  Newborn Data: Live born female  Birth Weight: 9 lb 15.1 oz (4510 g) APGAR: 8, 9  Newborn Delivery   Birth date/time:  10/10/2018 06:27:00 Delivery type:  C-Section, Vacuum Assisted Trial of labor:  Yes C-section categorization:  Primary     Baby Feeding: Bottle and Breast Disposition:NICU for tachypnea and low glucose levels   10/14/2018 [name], MD

## 2018-10-16 ENCOUNTER — Inpatient Hospital Stay (HOSPITAL_COMMUNITY)
Admission: AD | Admit: 2018-10-16 | Discharge: 2018-10-17 | Disposition: A | Discharge status: Home / Self Care | Payer: Medicaid Other | Source: Ambulatory Visit | Attending: Obstetrics and Gynecology | Admitting: Obstetrics and Gynecology

## 2018-10-16 DIAGNOSIS — J810 Acute pulmonary edema: Secondary | ICD-10-CM | POA: Insufficient documentation

## 2018-10-16 DIAGNOSIS — O9089 Other complications of the puerperium, not elsewhere classified: Secondary | ICD-10-CM | POA: Insufficient documentation

## 2018-10-16 DIAGNOSIS — R000 Tachycardia, unspecified: Secondary | ICD-10-CM | POA: Insufficient documentation

## 2018-10-16 DIAGNOSIS — Z98891 History of uterine scar from previous surgery: Secondary | ICD-10-CM

## 2018-10-17 ENCOUNTER — Encounter (HOSPITAL_COMMUNITY): Payer: Self-pay

## 2018-10-17 ENCOUNTER — Inpatient Hospital Stay (HOSPITAL_COMMUNITY): Payer: Medicaid Other

## 2018-10-17 ENCOUNTER — Other Ambulatory Visit: Payer: Self-pay

## 2018-10-17 ENCOUNTER — Telehealth (HOSPITAL_COMMUNITY): Payer: Self-pay | Admitting: Lactation Services

## 2018-10-17 DIAGNOSIS — R000 Tachycardia, unspecified: Secondary | ICD-10-CM | POA: Diagnosis not present

## 2018-10-17 DIAGNOSIS — O9089 Other complications of the puerperium, not elsewhere classified: Secondary | ICD-10-CM | POA: Diagnosis not present

## 2018-10-17 DIAGNOSIS — J810 Acute pulmonary edema: Secondary | ICD-10-CM | POA: Diagnosis not present

## 2018-10-17 DIAGNOSIS — Z98891 History of uterine scar from previous surgery: Secondary | ICD-10-CM | POA: Diagnosis not present

## 2018-10-17 DIAGNOSIS — O864 Pyrexia of unknown origin following delivery: Secondary | ICD-10-CM | POA: Diagnosis present

## 2018-10-17 LAB — CBC WITH DIFFERENTIAL/PLATELET
Basophils Absolute: 0 10*3/uL (ref 0.0–0.1)
Basophils Relative: 0 %
Eosinophils Absolute: 0.1 10*3/uL (ref 0.0–0.5)
Eosinophils Relative: 1 %
HCT: 32.7 % — ABNORMAL LOW (ref 36.0–46.0)
Hemoglobin: 10.6 g/dL — ABNORMAL LOW (ref 12.0–15.0)
Lymphocytes Relative: 12 %
Lymphs Abs: 1.3 10*3/uL (ref 0.7–4.0)
MCH: 27.7 pg (ref 26.0–34.0)
MCHC: 32.4 g/dL (ref 30.0–36.0)
MCV: 85.6 fL (ref 80.0–100.0)
Monocytes Absolute: 0.4 10*3/uL (ref 0.1–1.0)
Monocytes Relative: 3 %
Neutro Abs: 9.5 10*3/uL — ABNORMAL HIGH (ref 1.7–7.7)
Neutrophils Relative %: 84 %
Platelets: 292 10*3/uL (ref 150–400)
RBC: 3.82 MIL/uL — ABNORMAL LOW (ref 3.87–5.11)
RDW: 20.7 % — ABNORMAL HIGH (ref 11.5–15.5)
WBC: 11.3 10*3/uL — ABNORMAL HIGH (ref 4.0–10.5)
nRBC: 0 % (ref 0.0–0.2)

## 2018-10-17 LAB — COMPREHENSIVE METABOLIC PANEL
ALT: 29 U/L (ref 0–44)
AST: 24 U/L (ref 15–41)
Albumin: 2.7 g/dL — ABNORMAL LOW (ref 3.5–5.0)
Alkaline Phosphatase: 63 U/L (ref 38–126)
Anion gap: 7 (ref 5–15)
BUN: 8 mg/dL (ref 6–20)
CO2: 24 mmol/L (ref 22–32)
Calcium: 8.2 mg/dL — ABNORMAL LOW (ref 8.9–10.3)
Chloride: 104 mmol/L (ref 98–111)
Creatinine, Ser: 0.76 mg/dL (ref 0.44–1.00)
GFR calc Af Amer: >60 mL/min (ref >60)
GFR calc non Af Amer: >60 mL/min (ref >60)
Glucose, Bld: 103 mg/dL — ABNORMAL HIGH (ref 70–99)
Potassium: 3.6 mmol/L (ref 3.5–5.1)
Sodium: 135 mmol/L (ref 135–145)
Total Bilirubin: 0.7 mg/dL (ref 0.3–1.2)
Total Protein: 5.8 g/dL — ABNORMAL LOW (ref 6.5–8.1)

## 2018-10-17 LAB — LACTIC ACID, PLASMA: Lactic Acid, Venous: 1.2 mmol/L (ref 0.5–1.9)

## 2018-10-17 LAB — INFLUENZA PANEL BY PCR (TYPE A & B)
Influenza A By PCR: NEGATIVE
Influenza B By PCR: NEGATIVE

## 2018-10-17 MED ORDER — KETOROLAC TROMETHAMINE 30 MG/ML IJ SOLN
30.0000 mg | Freq: Once | INTRAMUSCULAR | Status: AC
  Administered 2018-10-17: 30 mg via INTRAVENOUS
  Filled 2018-10-17: qty 1

## 2018-10-17 MED ORDER — IOPAMIDOL (ISOVUE-370) INJECTION 76%
100.0000 mL | Freq: Once | INTRAVENOUS | Status: AC | PRN
  Administered 2018-10-17: 100 mL via INTRAVENOUS

## 2018-10-17 MED ORDER — SODIUM CHLORIDE 0.9 % IV SOLN: 1000.0000 mL | INTRAVENOUS | Status: DC

## 2018-10-17 MED ORDER — FUROSEMIDE 10 MG/ML IJ SOLN
10.0000 mg | Freq: Once | INTRAMUSCULAR | Status: AC
  Administered 2018-10-17: 10 mg via INTRAVENOUS
  Filled 2018-10-17: qty 1

## 2018-10-17 MED ORDER — FUROSEMIDE 20 MG PO TABS: 20.0000 mg | ORAL_TABLET | Freq: Every day | ORAL | 0 refills | Status: DC

## 2018-10-17 MED ORDER — ONDANSETRON HCL 4 MG/2ML IJ SOLN
4.0000 mg | Freq: Once | INTRAMUSCULAR | Status: AC
  Administered 2018-10-17: 4 mg via INTRAVENOUS
  Filled 2018-10-17 (×2): qty 2

## 2018-10-17 MED ORDER — SODIUM CHLORIDE 0.9 % IV SOLN
1000.0000 mL | INTRAVENOUS | Status: DC
  Administered 2018-10-17: 1000 mL via INTRAVENOUS

## 2018-10-17 NOTE — Discharge Instructions (Signed)
Pulmonary Edema Pulmonary edema is abnormal fluid buildup in the lungs that can make it hard to breathe. Follow these instructions at home:  Talk to your doctor about an exercise program.  Eat a healthy diet: ? Eat fresh fruits, vegetables, and lean meats. ? Limit high fat and salty foods. ? Avoid processed, canned, or fried foods. ? Avoid fast food.  Follow your doctor's advice about taking medicine and recording the medicine you take.  Follow your doctor's advice about keeping a record of your weight.  Talk to your doctor about keeping track of your blood pressure.  Do not smoke.  Do not use nicotine patches or nicotine gum.  Make a follow-up appointment with your doctor.  Ask your doctor for a copy of your latest heart tracing (ECG) and keep a copy with you at all times. Get help right away if:  You have chest pain. THIS IS AN EMERGENCY. Do not wait to see if the pain will go away. Call for local emergency medical help. Do not drive yourself to the hospital.  You have sweating, feel sick to your stomach (nauseous), or are experiencing shortness of breath.  Your weight increases more than your doctor tells you it should.  You start to have shortness of breath.  You notice more swelling in your hands, feet, ankles, or belly.  You have dizziness, blurred vision, headache, or unsteadiness that does not go away.  You cough up bloody spit.  You have a cough that does not go away.  You are unable to sleep because it is hard to breathe.  You begin to feel a jumping or fluttering sensation (palpitations) in the chest that is unusual for you. This information is not intended to replace advice given to you by your health care provider. Make sure you discuss any questions you have with your health care provider. Document Released: 10/06/2009 Document Revised: 03/25/2016 Document Reviewed: 06/25/2013 Elsevier Interactive Patient Education  2018 [employer].   Sinus  Tachycardia Sinus tachycardia is a kind of fast heartbeat. In sinus tachycardia, the heart beats more than 100 times a minute. Sinus tachycardia starts in a part of the heart called the sinus node. Sinus tachycardia may be harmless, or it may be a sign of a serious condition. What are the causes? This condition may be caused by:  Exercise or exertion.  A fever.  Pain.  Loss of body fluids (dehydration).  Severe bleeding (hemorrhage).  Anxiety and stress.  Certain substances, including: ? Alcohol. ? Caffeine. ? Tobacco and nicotine products. ? Diet pills. ? Illegal drugs.  Medical conditions including: ? Heart disease. ? An infection. ? An overactive thyroid (hyperthyroidism). ? A lack of red blood cells (anemia).  What are the signs or symptoms? Symptoms of this condition include:  A feeling that the heart is beating quickly (palpitations).  Suddenly noticing your heartbeat (cardiac awareness).  Dizziness.  Tiredness (fatigue).  Shortness of breath.  Chest pain.  Nausea.  Fainting.  How is this diagnosed? This condition is diagnosed with:  A physical exam.  Other tests, such as: ? Blood tests. ? An electrocardiogram (ECG). This test measures the electrical activity of the heart. ? Holter monitoring. For this test, you wear a device that records your heartbeat for one or more days.  You may be referred to a heart specialist (cardiologist). How is this treated? Treatment for this condition depends on the cause or underlying condition. Treatment may involve:  Treating the underlying condition.  Taking new  medicines or changing your current medicines as told by your health care provider.  Making changes to your diet or lifestyle.  Practicing relaxation methods.  Follow these instructions at home: Lifestyle  Do not use any products that contain nicotine or tobacco, such as cigarettes and e-cigarettes. If you need help quitting, ask your health care  provider.  Learn relaxation methods, like deep breathing, to help you when you get stressed or anxious.  Do not use illegal drugs, such as cocaine.  Do not abuse alcohol. Limit alcohol intake to no more than 1 drink a day for non-pregnant women and 2 drinks a day for men. One drink equals 12 oz of beer, 5 oz of wine, or 1 oz of hard liquor.  Find time to rest and relax often. This reduces stress.  Avoid: ? Caffeine. ? Stimulants such as over-the-counter diet pills or pills that help you to stay awake. ? Situations that cause anxiety or stress. General instructions  Drink enough fluids to keep your urine clear or pale yellow.  Take over-the-counter and prescription medicines only as told by your health care provider.  Keep all follow-up visits as told by your health care provider. This is important. Contact a health care provider if:  You have a fever.  You have vomiting or diarrhea that keeps happening (is persistent). Get help right away if:  You have pain in your chest, upper arms, jaw, or neck.  You become weak or dizzy.  You feel faint.  You have palpitations that do not go away. This information is not intended to replace advice given to you by your health care provider. Make sure you discuss any questions you have with your health care provider. Document Released: 11/25/2004 Document Revised: 05/15/2016 Document Reviewed: 05/02/2015 Elsevier Interactive Patient Education  [employer].

## 2018-10-17 NOTE — MAU Note (Signed)
Carelink Code Sepsis Notified.

## 2018-10-17 NOTE — MAU Note (Signed)
Provided education reinforcing use of incentive spirometer and use of pillow to help splint abdomen when moving at home. Also advised to take pain meds as ordered.

## 2018-10-17 NOTE — MAU Provider Note (Signed)
History     CSN: [id number]  Arrival date and time: 10/16/18 2359   First Provider Initiated Contact with Patient 10/17/18 0017      Chief Complaint  Patient presents with  . Chills  . Generalized Body Aches  . Post-op Problem   Heather Bowman is a 37 y.o. G1P1 7 days PP s/p Primary C/S on 12/10 for arrest of descent who presents to MAU via EMS for chills bodyaches and incision pain. She reports symptoms started occurring tonight prior to going to bed. She reports, "cold and pain started going all over body starting from back to abdomen to legs, then radiated to incision". Rates pain 5/10- has not taken any medication for pain. EMS reports fever of 101 en route to hospital and "reddish yellow discharge on steri strips". On arrival to MAU patient reports difficulty catching breathe, reports worse when laying down, denies being around anyone with Flu or Pneumonia that she knows. Reports she has been back and forth to hospital to visit baby that is currently in NICU.   She reports pregnancy was complicated by gestational diabetes which she was on Metformin. Denies HTN. Denies HA, vision changes or epigastric pain. Reports occasional nausea that started last night. She denies increased vaginal bleeding or passing clots.   OB History    Gravida  1   Para  1   Term  1   Preterm      AB      Living  1     SAB      TAB      Ectopic      Multiple  0   Live Births  1           Past Medical History:  Diagnosis Date  . Anxiety   . Arthritis   . Breast mass    right axilla  . Chronic pain syndrome   . Constipation    uses linzess daily   . Depression   . Diabetes mellitus without complication ([place])   . Fibroid   . GERD (gastroesophageal reflux disease)   . IBS (irritable bowel syndrome)   . PCOS (polycystic ovarian syndrome)    on metformin for this    Past Surgical History:  Procedure Laterality Date  . CESAREAN SECTION N/A 10/10/2018   Procedure: CESAREAN  SECTION;  Surgeon: Janyth Contes, MD;  Location: [facility];  Service: Obstetrics;  Laterality: N/A;  . COLONOSCOPY    . KNEE SURGERY    . MYOMECTOMY    . POLYPECTOMY    . right axilla breast mass removal    . WISDOM TOOTH EXTRACTION      Family History  Problem Relation Age of Onset  . Heart disease Father   . Diabetes Father   . Hyperlipidemia Father   . Hypertension Father   . Stroke Father   . Depression Sister   . Diabetes Sister   . Hyperlipidemia Sister   . Hypertension Sister   . Asthma Brother   . Cancer Maternal Grandmother   . Breast cancer Paternal Aunt   . Colon cancer Neg Hx   . Colon polyps Neg Hx   . Esophageal cancer Neg Hx   . Pancreatic cancer Neg Hx   . Rectal cancer Neg Hx   . Stomach cancer Neg Hx     Social History   Tobacco Use  . Smoking status: Never Smoker  . Smokeless tobacco: Never Used  Substance Use Topics  . Alcohol use:  Yes    Comment: on holidays or special occasions  . Drug use: No    Allergies: No Known Allergies  Medications Prior to Admission  Medication Sig Dispense Refill Last Dose  . ibuprofen (ADVIL,MOTRIN) 800 MG tablet Take 1 tablet (800 mg total) by mouth 3 (three) times daily. 30 tablet 0 10/17/2018 at Unknown time  . oxyCODONE (OXY IR/ROXICODONE) 5 MG immediate release tablet Take 1-2 tablets (5-10 mg total) by mouth every 4 (four) hours as needed for moderate pain. 30 tablet 0 10/17/2018 at Unknown time  . calcium carbonate (TUMS - DOSED IN MG ELEMENTAL CALCIUM) 500 MG chewable tablet Chew 4 tablets by mouth 2 (two) times daily as needed for indigestion or heartburn.   Past Week at Unknown time  . hydrOXYzine (ATARAX/VISTARIL) 50 MG tablet Take 50 mg by mouth daily as needed for itching.    Past Week at Unknown time  . linaclotide (LINZESS) 145 MCG CAPS capsule Take 1 tablet by mouth daily as needed (constipation).    Past Week at Unknown time  . QUEtiapine (SEROQUEL) 200 MG tablet Take 400 mg by mouth  at bedtime as needed (sleep).    Past Month at Unknown time  . valACYclovir (VALTREX) 500 MG tablet Take 500 mg by mouth 2 (two) times daily as needed (outbreaks).    Past Month at Unknown time    Review of Systems  Constitutional: Positive for chills.       Body aches  Respiratory: Positive for shortness of breath.   Cardiovascular: Negative.   Gastrointestinal: Positive for nausea. Negative for constipation, diarrhea and vomiting.       Incisional pain   Genitourinary: Positive for vaginal bleeding.  Neurological: Negative.    Physical Exam   Patient Vitals for the past 24 hrs:  BP Temp Temp src Pulse Resp SpO2  10/17/18 0453 134/71 99.3 F (37.4 C) Oral (!) 107 20 -  10/17/18 0435 - - - - - 100 %  10/17/18 0430 - - - - - 99 %  10/17/18 0425 - - - - - 99 %  10/17/18 0420 - - - - - 95 %  10/17/18 0415 - - - - - 95 %  10/17/18 0410 - - - - - 94 %  10/17/18 0405 - - - - - 97 %  10/17/18 0400 - - - - - 97 %  10/17/18 0355 - - - - - 97 %  10/17/18 0350 - - - - - 97 %  10/17/18 0310 - - - - - 100 %  10/17/18 0305 - - - - - 94 %  10/17/18 0300 - - - - - 94 %  10/17/18 0255 - - - - - 93 %  10/17/18 0245 - - - - - 95 %  10/17/18 0240 - - - - - 97 %  10/17/18 0235 - - - - - 96 %  10/17/18 0230 - - - - - 98 %  10/17/18 0225 - - - - - 95 %  10/17/18 0220 - - - - - 95 %  10/17/18 0215 - - - - - 95 %  10/17/18 0210 - - - - - 95 %  10/17/18 0205 - - - - - 94 %  10/17/18 0201 113/62 - - (!) 119 - -  10/17/18 0200 - - - - - 93 %  10/17/18 0155 - - - - - 95 %  10/17/18 0150 - - - - - 92 %  10/17/18 0145 - - - - - 90 %  10/17/18 0140 - - - - - 90 %  10/17/18 0136 132/75 99.7 F (37.6 C) - (!) 119 20 -  10/17/18 0110 - - - - - 100 %  10/17/18 0105 - - - - - 99 %  10/17/18 0030 - - - - - 97 %  10/17/18 0025 - - - - - 96 %  10/17/18 0020 - - - - - 92 %  10/17/18 0016 (!) 150/79 - - (!) 130 - -  10/17/18 0008 (!) 158/84 - - (!) 133 - -  10/17/18 0007 (!) 158/84 (!) 101.2 F (38.4  C) Oral (!) 133 (!) 28 100 %    Physical Exam  Constitutional: She is oriented to person, place, and time. She appears distressed.  Cardiovascular: Normal heart sounds. Tachycardia present.  Respiratory: Tachypnea noted. She has no decreased breath sounds. She has no wheezes.  GI: Soft. Bowel sounds are normal. She exhibits no distension. There is abdominal tenderness. There is no rebound and no guarding.  Tenderness around incision, no active drainage present from incision, steri strips have old blood and small old drainage noted, incision approximated and intact   Musculoskeletal: Normal range of motion.        General: Edema present.     Comments: +2 pedal edema  Neurological: She is alert and oriented to person, place, and time. She displays normal reflexes. She exhibits normal muscle tone.  Skin: She is diaphoretic.   MAU Course  Procedures  MDM Sepsis protocol initiated d/t tachycardia, tachypnea, and fever, Dr Rip Harbour notified and aware of protocol being initiated.   Orders Placed This Encounter  Procedures  . Blood Culture (routine x 2)  . Comprehensive metabolic panel  . CBC WITH DIFFERENTIAL  . Urinalysis, Routine w reflex microscopic  . Lactic acid, plasma  . Influenza panel by PCR (type A & B)  . Diet NPO time specified  . Refer to Sidebar Report for: Sepsis Bundle ED/IP  . Document vital signs within 1-hour of fluid bolus completion and notify provider of bolus completion  . Document Actual / Estimated Weight  . Insert peripheral IV x 2  . Initiate Carrier Fluid Protocol  . Vital signs  . Call Code Sepsis ([facility] [phone]) Reason for Consult? tracking  . pharmacy consult  . Droplet precaution  . Pulse oximetry, continuous  . ED EKG 12-Lead   Treatments in MAU included IV NS bolus, pulse decreased and fever reduced after treatment. Patient resting on side and reports feeling better. Labs reviewed and noted no sepsis or infection at this time. Influenza  negative. Steri strips changed using sterile method, no drainage or bleeding noted.   EKG noted sinus tachycardia with possible left atrial enlargement   Upon reassessment after treatment, SOB with exertion noted. Patient rolled over in bed and got up, tachypnea and SOB reported by patient. Consult with Dr Rip Harbour with assessment, agrees to plan of care with examination for PE. CT ordered.   Results for orders placed or performed during the hospital encounter of 10/16/18 (from the past 24 hour(s))  Comprehensive metabolic panel     Status: Abnormal   Collection Time: 10/17/18 12:31 AM  Result Value Ref Range   Sodium 135 135 - 145 mmol/L   Potassium 3.6 3.5 - 5.1 mmol/L   Chloride 104 98 - 111 mmol/L   CO2 24 22 - 32 mmol/L   Glucose, Bld 103 (H) 70 - 99 mg/dL  BUN 8 6 - 20 mg/dL   Creatinine, Ser 0.76 0.44 - 1.00 mg/dL   Calcium 8.2 (L) 8.9 - 10.3 mg/dL   Total Protein 5.8 (L) 6.5 - 8.1 g/dL   Albumin 2.7 (L) 3.5 - 5.0 g/dL   AST 24 15 - 41 U/L   ALT 29 0 - 44 U/L   Alkaline Phosphatase 63 38 - 126 U/L   Total Bilirubin 0.7 0.3 - 1.2 mg/dL   GFR calc non Af Amer >60 >60 mL/min   GFR calc Af Amer >60 >60 mL/min   Anion gap 7 5 - 15  CBC WITH DIFFERENTIAL     Status: Abnormal   Collection Time: 10/17/18 12:31 AM  Result Value Ref Range   WBC 11.3 (H) 4.0 - 10.5 K/uL   RBC 3.82 (L) 3.87 - 5.11 MIL/uL   Hemoglobin 10.6 (L) 12.0 - 15.0 g/dL   HCT 32.7 (L) 36.0 - 46.0 %   MCV 85.6 80.0 - 100.0 fL   MCH 27.7 26.0 - 34.0 pg   MCHC 32.4 30.0 - 36.0 g/dL   RDW 20.7 (H) 11.5 - 15.5 %   Platelets 292 150 - 400 K/uL   nRBC 0.0 0.0 - 0.2 %   Neutrophils Relative % 84 %   Neutro Abs 9.5 (H) 1.7 - 7.7 K/uL   Lymphocytes Relative 12 %   Lymphs Abs 1.3 0.7 - 4.0 K/uL   Monocytes Relative 3 %   Monocytes Absolute 0.4 0.1 - 1.0 K/uL   Eosinophils Relative 1 %   Eosinophils Absolute 0.1 0.0 - 0.5 K/uL   Basophils Relative 0 %   Basophils Absolute 0.0 0.0 - 0.1 K/uL  Lactic acid, plasma      Status: None   Collection Time: 10/17/18 12:31 AM  Result Value Ref Range   Lactic Acid, Venous 1.2 0.5 - 1.9 mmol/L  Influenza panel by PCR (type A & B)     Status: None   Collection Time: 10/17/18 12:45 AM  Result Value Ref Range   Influenza A By PCR NEGATIVE NEGATIVE   Influenza B By PCR NEGATIVE NEGATIVE   Results of CT pending.  Ct Angio Chest Pe W And/or Wo Contrast  Result Date: 10/17/2018 CLINICAL DATA:  Shortness of breath and tachypnea. Recent Caesarean section 1 week ago. EXAM: CT ANGIOGRAPHY CHEST WITH CONTRAST TECHNIQUE: Multidetector CT imaging of the chest was performed using the standard protocol during bolus administration of intravenous contrast. Multiplanar CT image reconstructions and MIPs were obtained to evaluate the vascular anatomy. CONTRAST:  170mL ISOVUE-370 IOPAMIDOL (ISOVUE-370) INJECTION 76% COMPARISON:  None. FINDINGS: Cardiovascular: There are no filling defects within the central pulmonary arteries to suggest pulmonary embolus. Evaluation of the submental and subsegmental branches is suboptimal due to contrast bolus timing. The thoracic aorta is normal in caliber. No aortic dissection. The heart is normal in size. No pericardial effusion. Mediastinum/Nodes: No mediastinal or hilar adenopathy. Soft tissue density in the anterior mediastinum consistent with residual or recurrent thymus. Multiple prominent left axillary nodes esophagus is decompressed. No thyroid nodule. Lungs/Pleura: Small bilateral pleural effusions. Mild basilar and apical smooth septal thickening suggesting pulmonary edema. No focal airspace disease. No pneumothorax. The trachea and mainstem bronchi are patent. Upper Abdomen: Subcentimeter low-density in the right lobe of the liver is too small to characterize but likely cyst. No acute findings. Musculoskeletal: There are no acute or suspicious osseous abnormalities. Review of the MIP images confirms the above findings. IMPRESSION: 1. No central  pulmonary embolus.  2. Small pleural effusions with mild pulmonary edema. 3. Prominent left axillary nodes are likely reactive. Electronically Signed   By: Keith Rake M.D.   On: 10/17/2018 04:46   Consult with Dr Rip Harbour with results of CT scan, recommends Lasix 10mg  IV for pulmonary edema and notification of Dr Willis Modena for management. Consult with Dr Willis Modena of assessment and management. Okay with discharge home and outpatient management of pulmonary edema. Recommends Lasix daily for 1 week and follow up in the office this week for reassessment.   Discussed plan of care with patient- patient agrees to plan of care. Discussed pulmonary edema and Lasix medication to be used for 1 week. Discussed patient to call office in morning to set up appointment for reassessment this week, patient verbalizes understanding. Encouraged use of incentive spirometer. Rx for Lasix sent to pharmacy of choice. Discussed reasons to return to MAU. Incision clean, dry and intact. Pt stable at time of discharge.  Assessment and Plan   1. Acute pulmonary edema (HCC)   2. Sinus tachycardia   3. S/P cesarean section    Discharge home  Follow up in the office this week for reassessment  Return to MAU as needed Rx for Lasix Incentive spirometer  Postpartum care after C/S   Follow-up Information    [facility]. Schedule an appointment as soon as possible for a visit.   Why:  Make appointment to be seen this week for followup from pulmonary edema  Contact information: [place] [id number] [phone]          [place] 10/17/2018, 5:25 AM

## 2018-10-21 ENCOUNTER — Ambulatory Visit: Payer: Self-pay

## 2018-10-21 NOTE — Lactation Note (Signed)
This note was copied from a baby's chart. Lactation Consultation Note  Patient Name: Boy Lucianne Smestad DTOIZ'T Date: 10/21/2018 Reason for consult: Follow-up assessment;NICU baby;Term;Maternal endocrine disorder Type of Endocrine Disorder?: PCOS  33 days old FT NICU female who is being partially BF and formula fed by his mother, she's a P1. Mom called for [facility] assistance because she was having a hard time latching baby on. When LC got to the NICU rooming in suite, baby was already asleep and sucking on a pacifier. Discussed with mom how pacifiers can hide feeding cues, baby is already feeding on demand between 30-35 ml of Gerber Gentle on each feeding.   Offered assistance with latch but mom politely declined stating that she didn't want to wake baby up, he was sound asleep, and stayed asleep even when LC removed pacifier out of baby's mouth with mom's permission. Mom has not pumped today; she voiced they didn't bring a pump into her room and that she didn't have time to go use the NICU pumping rooms. Mom and baby are going home today.  Reviewed discharge instructions, prevention and treatment of sore nipples as well as engorgement prevention and treatment. Mom is doing both (breast and bottle) and baby is feeding with a purple NFant nipple.   Feeding plan:  1. Encouraged mom to feed baby STS 8-12 times/24 hours or sooner if feeding cues are present 2. Pumping after feedings was also encouraged to help empty the breast and prevent engorgement 3. Mom will add coconut oil to her feeding plan and use it prior pumping 4. She will slowly substitute some formula with her EBM as she starts pumping more consistently to provide baby more breastmilk (whether at the breast or in a bottle)  Mom had questions about her medications, she already spoke to [facility] OP about it, but wanted to make sure if oxycodone is still safe, she's only taking it once a day. According to [facility] book "Medicantions and mother's milk" is an  L3, probably safe; advised mom to check with her OBGYN to see if she could do ibuprofen instead since she's 11 days post-partum already. Parents reported all questions and concerns were answered, they're both aware of [place] services and will call PRN.  Maternal Data    Feeding Feeding Type: Formula Nipple Type: Nfant Slow Flow (purple)  LATCH Score Latch: Repeated attempts needed to sustain latch, nipple held in mouth throughout feeding, stimulation needed to elicit sucking reflex.  Audible Swallowing: Spontaneous and intermittent  Type of Nipple: Everted at rest and after stimulation  Comfort (Breast/Nipple): Soft / non-tender  Hold (Positioning): Assistance needed to correctly position infant at breast and maintain latch.  LATCH Score: 8  Interventions Interventions: Breast feeding basics reviewed  Lactation Tools Discussed/Used     Consult Status Consult Status: PRN Follow-up type: Call as needed    Laure Leone S [place] 10/21/2018, 2:26 PM

## 2018-10-22 LAB — CULTURE, BLOOD (ROUTINE X 2)
Culture: NO GROWTH
Culture: NO GROWTH
Special Requests: ADEQUATE
Special Requests: ADEQUATE

## 2018-10-23 ENCOUNTER — Other Ambulatory Visit: Payer: Self-pay

## 2018-10-23 ENCOUNTER — Emergency Department (HOSPITAL_COMMUNITY)
Admission: EM | Admit: 2018-10-23 | Discharge: 2018-10-24 | Disposition: A | Discharge status: Home / Self Care | Payer: Medicaid Other | Attending: Emergency Medicine | Admitting: Emergency Medicine

## 2018-10-23 ENCOUNTER — Encounter (HOSPITAL_COMMUNITY): Payer: Self-pay

## 2018-10-23 ENCOUNTER — Emergency Department (HOSPITAL_COMMUNITY): Payer: Medicaid Other

## 2018-10-23 DIAGNOSIS — R600 Localized edema: Secondary | ICD-10-CM | POA: Diagnosis present

## 2018-10-23 DIAGNOSIS — Z79899 Other long term (current) drug therapy: Secondary | ICD-10-CM | POA: Diagnosis not present

## 2018-10-23 DIAGNOSIS — D649 Anemia, unspecified: Secondary | ICD-10-CM | POA: Insufficient documentation

## 2018-10-23 DIAGNOSIS — E119 Type 2 diabetes mellitus without complications: Secondary | ICD-10-CM | POA: Diagnosis not present

## 2018-10-23 LAB — URINALYSIS, ROUTINE W REFLEX MICROSCOPIC
BACTERIA UA: NONE SEEN
BILIRUBIN URINE: NEGATIVE
Glucose, UA: NEGATIVE mg/dL
Ketones, ur: 5 mg/dL — AB
Nitrite: NEGATIVE
Protein, ur: 100 mg/dL — AB
RBC / HPF: >50 RBC/hpf — ABNORMAL HIGH (ref 0–5)
Specific Gravity, Urine: 1.025 (ref 1.005–1.030)
WBC, UA: >50 WBC/hpf — ABNORMAL HIGH (ref 0–5)
pH: 5 (ref 5.0–8.0)

## 2018-10-23 LAB — CBC
HCT: 32.7 % — ABNORMAL LOW (ref 36.0–46.0)
Hemoglobin: 9.7 g/dL — ABNORMAL LOW (ref 12.0–15.0)
MCH: 25.1 pg — AB (ref 26.0–34.0)
MCHC: 29.7 g/dL — ABNORMAL LOW (ref 30.0–36.0)
MCV: 84.5 fL (ref 80.0–100.0)
Platelets: 397 10*3/uL (ref 150–400)
RBC: 3.87 MIL/uL (ref 3.87–5.11)
RDW: 19.8 % — ABNORMAL HIGH (ref 11.5–15.5)
WBC: 5.3 10*3/uL (ref 4.0–10.5)
nRBC: 0.9 % — ABNORMAL HIGH (ref 0.0–0.2)

## 2018-10-23 LAB — BASIC METABOLIC PANEL
Anion gap: 7 (ref 5–15)
BUN: 9 mg/dL (ref 6–20)
CO2: 26 mmol/L (ref 22–32)
Calcium: 8.4 mg/dL — ABNORMAL LOW (ref 8.9–10.3)
Chloride: 107 mmol/L (ref 98–111)
Creatinine, Ser: 0.84 mg/dL (ref 0.44–1.00)
GFR calc Af Amer: >60 mL/min (ref >60)
GFR calc non Af Amer: >60 mL/min (ref >60)
Glucose, Bld: 107 mg/dL — ABNORMAL HIGH (ref 70–99)
Potassium: 3.3 mmol/L — ABNORMAL LOW (ref 3.5–5.1)
Sodium: 140 mmol/L (ref 135–145)

## 2018-10-23 LAB — BRAIN NATRIURETIC PEPTIDE: B Natriuretic Peptide: 124.3 pg/mL — ABNORMAL HIGH (ref 0.0–100.0)

## 2018-10-23 LAB — I-STAT TROPONIN, ED: Troponin i, poc: 0 ng/mL (ref 0.00–0.08)

## 2018-10-23 MED ORDER — FUROSEMIDE 10 MG/ML IJ SOLN
40.0000 mg | Freq: Once | INTRAMUSCULAR | Status: AC
  Administered 2018-10-23: 40 mg via INTRAVENOUS
  Filled 2018-10-23: qty 4

## 2018-10-23 NOTE — ED Notes (Signed)
Pt ambulated on pulse ox saturation remained at 100%.

## 2018-10-23 NOTE — ED Triage Notes (Signed)
Pt here for bilateral leg swelling after having a baby on the 10th of December.  Seen at PCP who did a CXR which showed some fluid and prescribed lasix.  Took the lasix for a week.  Currently not on lasix anymore.

## 2018-10-23 NOTE — ED Notes (Signed)
Pt given a urine cup and made aware we need a urine sample.

## 2018-10-23 NOTE — ED Notes (Signed)
Pt alert and oriented x4. Ambulatory with steady gait. Pt has 2+ bilateral keg swelling post cesarean delivery 12 days ago. Pt denies headache. BP WDL. Denies SOB, has no further complaints.

## 2018-10-24 MED ORDER — IBUPROFEN 400 MG PO TABS
600.0000 mg | ORAL_TABLET | Freq: Once | ORAL | Status: AC
  Administered 2018-10-24: 600 mg via ORAL
  Filled 2018-10-24: qty 1

## 2018-10-24 MED ORDER — FUROSEMIDE 40 MG PO TABS: 40.0000 mg | ORAL_TABLET | Freq: Every day | ORAL | 0 refills | Status: DC

## 2018-10-24 MED ORDER — POTASSIUM CHLORIDE ER 20 MEQ PO TBCR: 20.0000 meq | EXTENDED_RELEASE_TABLET | Freq: Every day | ORAL | 0 refills | Status: AC

## 2018-10-24 NOTE — ED Provider Notes (Signed)
[facility] EMERGENCY DEPARTMENT Provider Note   CSN: [id number] Arrival date & time: 10/23/18  1745     History   Chief Complaint Chief Complaint  Patient presents with  . Leg Swelling    HPI Heather Bowman is a 37 y.o. female 2 weeks postpartum presents today for evaluation of swelling of her bilateral legs.  She was seen by her primary care provider today for bilateral leg swelling and referred here.  Chart review shows that she was seen at [facility] on 10/16/2018 and diagnosed with acute pulmonary edema after having a CT angios PE study and discharged with Lasix.  She tells me that she finished the Lasix and did not have any real symptom change with the Lasix.  She denies any new abdominal or chest pain.  Denies dysuria increased frequency or urgency.  She denies any shortness of breath.  She does not have a personal history of blood clots.  HPI  Past Medical History:  Diagnosis Date  . Anxiety   . Arthritis   . Breast mass    right axilla  . Chronic pain syndrome   . Constipation    uses linzess daily   . Depression   . Diabetes mellitus without complication ([place])   . Fibroid   . GERD (gastroesophageal reflux disease)   . IBS (irritable bowel syndrome)   . PCOS (polycystic ovarian syndrome)    on metformin for this    Patient Active Problem List   Diagnosis Date Noted  . Pregnancy and non-insulin-dependent diabetes mellitus in third trimester 10/09/2018  . Abnormal glucose tolerance test (GTT) during pregnancy, antepartum 06/14/2018  . Hemarthrosis 09/08/2017  . Endometrial polyp 12/30/2016  . Constipation 12/03/2016  . Axillary mass, right 12/03/2016  . Painful pinna of both ears on examination 05/14/2016  . Infertility counseling 03/21/2015  . Axillary adenitis 08/07/2013  . Excessive sweating 06/13/2013  . Nausea 01/17/2013  . Dysuria 07/11/2012  . PCOS (polycystic ovarian syndrome) 05/26/2012  . GAD (generalized anxiety  disorder) 05/02/2012  . Chronic pain syndrome 05/02/2012  . GERD (gastroesophageal reflux disease) 05/02/2012    Past Surgical History:  Procedure Laterality Date  . CESAREAN SECTION N/A 10/10/2018   Procedure: CESAREAN SECTION;  Surgeon: Janyth Contes, MD;  Location: [facility];  Service: Obstetrics;  Laterality: N/A;  . COLONOSCOPY    . KNEE SURGERY    . MYOMECTOMY    . POLYPECTOMY    . right axilla breast mass removal    . WISDOM TOOTH EXTRACTION       OB History    Gravida  1   Para  1   Term  1   Preterm      AB      Living  1     SAB      TAB      Ectopic      Multiple  0   Live Births  1            Home Medications    Prior to Admission medications   Medication Sig Start Date End Date Taking? Authorizing Provider  calcium carbonate (TUMS - DOSED IN MG ELEMENTAL CALCIUM) 500 MG chewable tablet Chew 4 tablets by mouth 2 (two) times daily as needed for indigestion or heartburn.   Yes [provider]  hydrOXYzine (ATARAX/VISTARIL) 50 MG tablet Take 50 mg by mouth daily as needed for itching.    Yes [provider]  ibuprofen (ADVIL,MOTRIN) 800  MG tablet Take 1 tablet (800 mg total) by mouth 3 (three) times daily. 10/14/18  Yes Paula Compton, MD  linaclotide [facility]) 145 MCG CAPS capsule Take 1 tablet by mouth daily as needed (constipation).    Yes [provider]  metoCLOPramide (REGLAN) 10 MG tablet Take 10 mg by mouth every 8 (eight) hours as needed for nausea.   Yes [provider]  oxyCODONE (OXY IR/ROXICODONE) 5 MG immediate release tablet Take 1-2 tablets (5-10 mg total) by mouth every 4 (four) hours as needed for moderate pain. 10/14/18  Yes Paula Compton, MD  QUEtiapine (SEROQUEL) 200 MG tablet Take 400 mg by mouth at bedtime.    Yes [provider]  valACYclovir (VALTREX) 500 MG tablet Take 500 mg by mouth 2 (two) times daily as needed (outbreaks).    Yes [provider]  furosemide (LASIX) 40 MG tablet Take 1 tablet (40 mg total) by mouth daily for 7 days. 10/24/18 10/31/18  Lorin Glass, PA-C  potassium chloride 20 MEQ TBCR Take 20 mEq by mouth daily for 7 days. 10/24/18 10/31/18  Lorin Glass, PA-C    Family History Family History  Problem Relation Age of Onset  . Heart disease Father   . Diabetes Father   . Hyperlipidemia Father   . Hypertension Father   . Stroke Father   . Depression Sister   . Diabetes Sister   . Hyperlipidemia Sister   . Hypertension Sister   . Asthma Brother   . Hypertension Mother   . Cancer Maternal Grandmother        Ovarian  . Breast cancer Paternal Aunt   . Colon cancer Neg Hx   . Colon polyps Neg Hx   . Esophageal cancer Neg Hx   . Pancreatic cancer Neg Hx   . Rectal cancer Neg Hx   . Stomach cancer Neg Hx     Social History Social History   Tobacco Use  . Smoking status: Never Smoker  . Smokeless tobacco: Never Used  Substance Use Topics  . Alcohol use: Yes    Comment: on holidays or special occasions  . Drug use: No     Allergies   Patient has no known allergies.   Review of Systems Review of Systems  Constitutional: Negative for chills and fever.  Respiratory: Negative for chest tightness and shortness of breath.   Cardiovascular: Positive for leg swelling. Negative for chest pain and palpitations.  Gastrointestinal: Negative for abdominal pain, diarrhea, nausea and vomiting.  Genitourinary: Negative for decreased urine volume, difficulty urinating, dysuria, hematuria and urgency.  Musculoskeletal: Negative for back pain.  Neurological: Negative for dizziness, weakness, light-headedness and headaches.  All other systems reviewed and are negative.    Physical Exam Updated Vital Signs BP (!) 149/87   Pulse 74   Temp 98.1 F (36.7 C) (Oral)   Resp 18   SpO2 100%   Physical Exam Vitals signs and nursing note reviewed.  Constitutional:      General: She is not in  acute distress.    Appearance: She is well-developed.  HENT:     Head: Normocephalic and atraumatic.     Mouth/Throat:     Mouth: Mucous membranes are moist.  Eyes:     Conjunctiva/sclera: Conjunctivae normal.  Neck:     Musculoskeletal: Normal range of motion and neck supple.  Cardiovascular:     Rate and Rhythm: Normal rate and regular rhythm.     Pulses: Normal pulses.  Heart sounds: Normal heart sounds. No murmur.  Pulmonary:     Effort: Pulmonary effort is normal. No respiratory distress.     Breath sounds: Normal breath sounds.  Abdominal:     General: There is no distension.     Palpations: Abdomen is soft.     Tenderness: There is no abdominal tenderness. There is no guarding.     Comments: Appears consistent with post partum state.  No drainage from c-section incision.   Musculoskeletal:     Comments: 2+ pitting edema to bilateral lower extremities.  No calf pain.  Bilateral lower extremities are symmetrical.  Skin:    General: Skin is warm and dry.  Neurological:     Mental Status: She is alert.      ED Treatments / Results  Labs (all labs ordered are listed, but only abnormal results are displayed) Labs Reviewed  CBC - Abnormal; Notable for the following components:      Result Value   Hemoglobin 9.7 (*)    HCT 32.7 (*)    MCH 25.1 (*)    MCHC 29.7 (*)    RDW 19.8 (*)    nRBC 0.9 (*)    All other components within normal limits  BASIC METABOLIC PANEL - Abnormal; Notable for the following components:   Potassium 3.3 (*)    Glucose, Bld 107 (*)    Calcium 8.4 (*)    All other components within normal limits  BRAIN NATRIURETIC PEPTIDE - Abnormal; Notable for the following components:   B Natriuretic Peptide 124.3 (*)    All other components within normal limits  URINALYSIS, ROUTINE W REFLEX MICROSCOPIC - Abnormal; Notable for the following components:   Color, Urine AMBER (*)    APPearance CLOUDY (*)    Hgb urine dipstick LARGE (*)    Ketones, ur 5  (*)    Protein, ur 100 (*)    Leukocytes, UA LARGE (*)    RBC / HPF >50 (*)    WBC, UA >50 (*)    All other components within normal limits  URINE CULTURE  I-STAT TROPONIN, ED    EKG None  Radiology Dg Chest 2 View  Result Date: 10/23/2018 CLINICAL DATA:  Lower extremity swelling EXAM: CHEST - 2 VIEW COMPARISON:  None. FINDINGS: The heart size and mediastinal contours are within normal limits. Both lungs are clear. The visualized skeletal structures are unremarkable. IMPRESSION: Clear lungs. Electronically Signed   By: Ulyses Jarred M.D.   On: 10/23/2018 22:17    Procedures Procedures (including critical care time)  Medications Ordered in ED Medications  furosemide (LASIX) injection 40 mg (40 mg Intravenous Given 10/23/18 2252)  ibuprofen (ADVIL,MOTRIN) tablet 600 mg (600 mg Oral Given 10/24/18 0053)     Initial Impression / Assessment and Plan / ED Course  I have reviewed the triage vital signs and the nursing notes.  Pertinent labs & imaging results that were available during my care of the patient were reviewed by me and considered in my medical decision making (see chart for details).  Clinical Course as of Oct 24 108  Mon Oct 23, 2018  2328 40 mg lasix daily, 20 meg k daily   [EH]  Tue Oct 24, 2018  0003 Patient reevaluated, she has had large amounts of urine output since Lasix.  Will give urine sample.   [EH]    Clinical Course User Index [EH] Lorin Glass, PA-C   Patient presents today for evaluation of bilateral leg swelling for  approximately 14 days.  This started after she had a cesarean section.  She previously was evaluated for this work-up included CT angios PE study which did not show evidence of pulmonary emboli or other abnormality.  CT then showed normal heart size.  She does not have any chest pain or shortness of breath.  She was discharged on Lasix however has not had significant relief.  Mild anemia with a hemoglobin of 9.7.  Her potassium  is slightly low at 3.3.  Urine is contaminated with 11-20 squamous epithelial cells.  Discussed this with patient, she is not having symptoms therefore will send for culture.  BNP is slightly elevated at 124.  CXR did not show any pulmonary edema or cardiomegaly.  If her symptoms were from postpartum cardiomegaly I would suspect increased heart size or pulmonary edema.  She was able to ambulate in the department with out becoming hypoxic.  She was given lasix in the department with significant urine output.  Her potassium was mildly low at 3.3.  Will discharge her with 40 mg of Lasix daily with 20 M EQ potassium for 1 week.   This patient was discussed with Dr. Gilford Raid.    Return precautions were discussed with patient who states their understanding.  At the time of discharge patient denied any unaddressed complaints or concerns.  Patient is agreeable for discharge home.   Final Clinical Impressions(s) / ED Diagnoses   Final diagnoses:  Bilateral lower extremity edema  Anemia, unspecified type    ED Discharge Orders         Ordered    furosemide (LASIX) 40 MG tablet  Daily     10/24/18 0107    potassium chloride 20 MEQ TBCR  Daily     10/24/18 0107           Lorin Glass, PA-C 10/24/18 3419    Isla Pence, MD 10/26/18 [phone]

## 2018-10-24 NOTE — ED Notes (Signed)
Unable to collect urine culture. Lab stated no bottles to collect culture.

## 2018-10-24 NOTE — Discharge Instructions (Addendum)
Today your urine was possibly infected, however as you are not having symptoms consistent with a urinary tract infection your urine will be sent for culture.  If this is positive and you need antibiotics you should be contacted.  Your leg swelling was treated with Lasix while in the department.  I have given you a prescription for 1 week of Lasix along with potassium.  It is important that you take the potassium while you are taking the Lasix.  Your blood pressure is slightly elevated while in the emergency room.  Please follow-up with your primary care doctor in the next week for recheck.  Your labs show you are slightly anemic.  This can be from being post partum.    Do not not breast feed while taking lasix.   If at any point you develop difficulties walking, shortness of breath, chest pain or have additional concerns please seek additional medical care and evaluation.

## 2018-10-27 LAB — URINE CULTURE: Culture: >=100000 — AB

## 2018-10-28 ENCOUNTER — Telehealth: Payer: Self-pay

## 2018-10-28 NOTE — Progress Notes (Signed)
ED Antimicrobial Stewardship Positive Culture Follow Up   Heather Bowman is an 37 y.o. female who presented to [facility] on 10/23/2018 with a chief complaint of  Chief Complaint  Patient presents with  . Leg Swelling    Recent Results (from the past 720 hour(s))  Blood Culture (routine x 2)     Status: None   Collection Time: 10/17/18 12:31 AM  Result Value Ref Range Status   Specimen Description   Final    BLOOD RIGHT ANTECUBITAL Performed at [facility], [address], [place], [place] [address]    Special Requests   Final    IN PEDIATRIC BOTTLE Blood Culture adequate volume Performed at [facility], [address] [place], [place] [address]    Culture   Final    NO GROWTH 5 DAYS Performed at [facility], [place] [address] [place], [place] [address]    Report Status 10/22/2018 FINAL  Final  Blood Culture (routine x 2)     Status: None   Collection Time: 10/17/18 12:31 AM  Result Value Ref Range Status   Specimen Description   Final    BLOOD LEFT ANTECUBITAL Performed at [facility], [address] [place], [place] [address]    Special Requests   Final    IN PEDIATRIC BOTTLE Blood Culture adequate volume Performed at [facility], [address] [place], [place] [address]    Culture   Final    NO GROWTH 5 DAYS Performed at [facility], [place] [address] [place], [place] [address]    Report Status 10/22/2018 FINAL  Final  Urine culture     Status: Abnormal   Collection Time: 10/23/18 10:49 PM  Result Value Ref Range Status   Specimen Description URINE, RANDOM  Final   Special Requests   Final    NONE Performed at [facility], [place] [address], [place],  [address]    Culture (A)  Final    >=100,000 COLONIES/mL ESCHERICHIA COLI Confirmed Extended Spectrum Beta-Lactamase Producer (ESBL).  In bloodstream infections from ESBL organisms, carbapenems are preferred over piperacillin/tazobactam. They are shown to have a lower  risk of mortality.    Report Status 10/27/2018 FINAL  Final   Organism ID, Bacteria ESCHERICHIA COLI (A)  Final      Susceptibility   Escherichia coli - MIC*    AMPICILLIN >=32 RESISTANT Resistant     CEFAZOLIN >=64 RESISTANT Resistant     CEFTRIAXONE >=64 RESISTANT Resistant     CIPROFLOXACIN 0.5 SENSITIVE Sensitive     GENTAMICIN >=16 RESISTANT Resistant     IMIPENEM <=0.25 SENSITIVE Sensitive     NITROFURANTOIN <=16 SENSITIVE Sensitive     TRIMETH/SULFA >=320 RESISTANT Resistant     AMPICILLIN/SULBACTAM >=32 RESISTANT Resistant     PIP/TAZO <=4 SENSITIVE Sensitive     Extended ESBL POSITIVE Resistant     * >=100,000 COLONIES/mL ESCHERICHIA COLI   No urinary symptoms, no abx indicated  ED Provider: Delia Heady, PA-C  [name] 10/28/2018, 9:13 AM Clinical Pharmacist Monday - Friday phone -  [phone] Saturday - Sunday phone - [phone]

## 2018-10-28 NOTE — Telephone Encounter (Signed)
No treatment for UC ED 10/24/18 per Delia Heady PAC

## 2019-09-30 IMAGING — CT CT ANGIO CHEST
1 of 4 series · 19 of 31 positions shown · IV contrast (OMNIPAQUE)
Comparison: None.

CLINICAL DATA: Shortness of breath and tachypnea. Recent Caesarean
section 1 week ago.

EXAM:
CT ANGIOGRAPHY CHEST WITH CONTRAST
TECHNIQUE: Multidetector CT imaging of the chest was performed using the
standard protocol during bolus administration of intravenous
contrast. Multiplanar CT image reconstructions and MIPs were
obtained to evaluate the vascular anatomy.
CONTRAST:  100mL 5OF13S-L2H IOPAMIDOL (5OF13S-L2H) INJECTION 76%

[Series 6: (person_name) thins · axial · 0.59mm/px · z∈[+778,+1053]mm · 19 of 441 slices shown]
[im 24/441  lung]
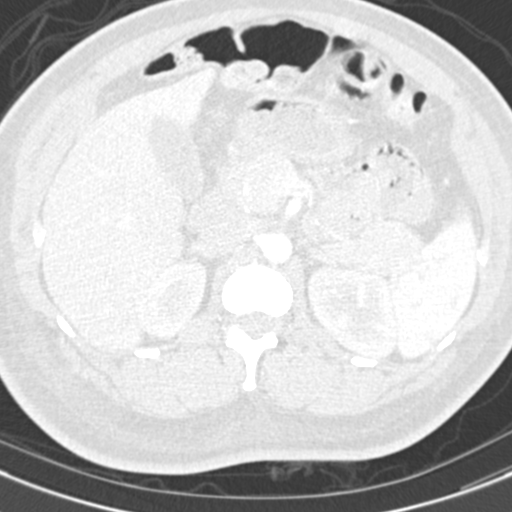
[im 47/441  mediastinal]
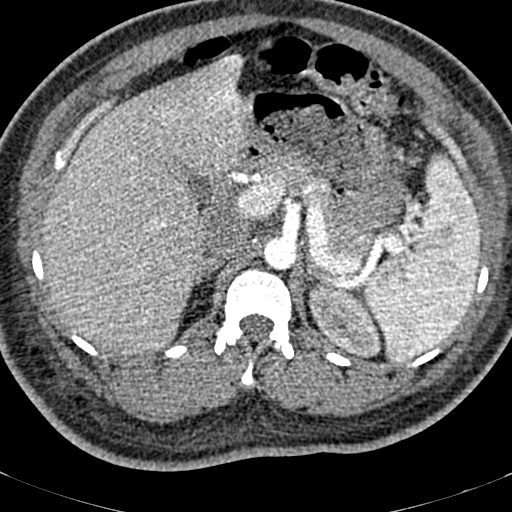
[im 70/441  lung]
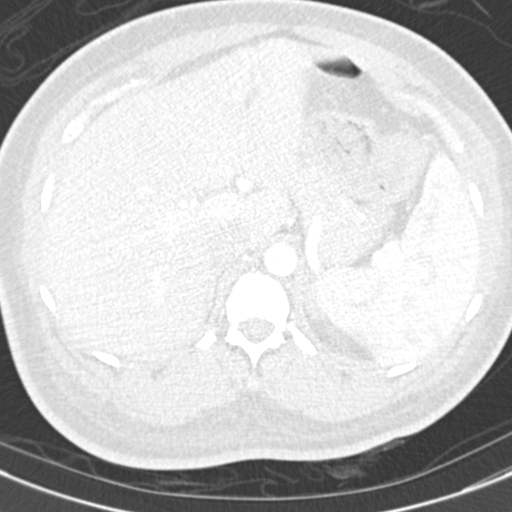
[im 93/441  mediastinal]
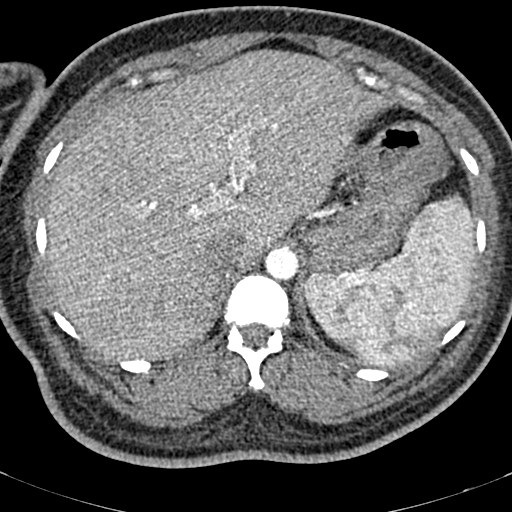
[im 116/441  lung]
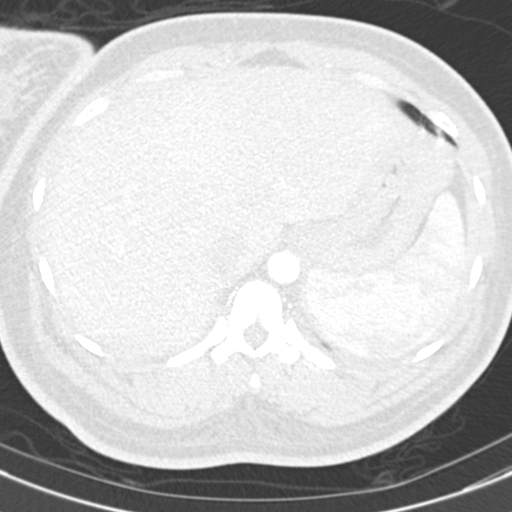
[im 139/441  mediastinal]
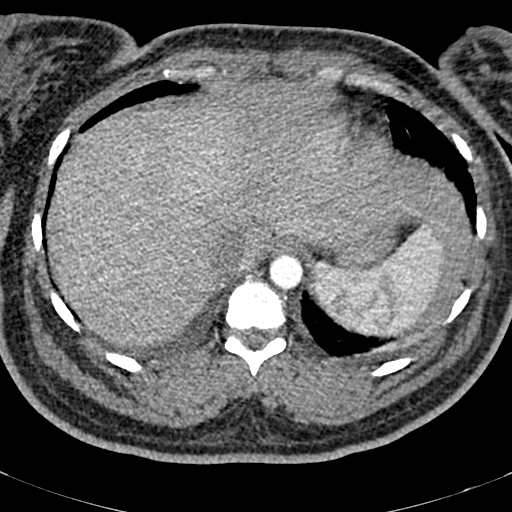
[im 163/441  lung]
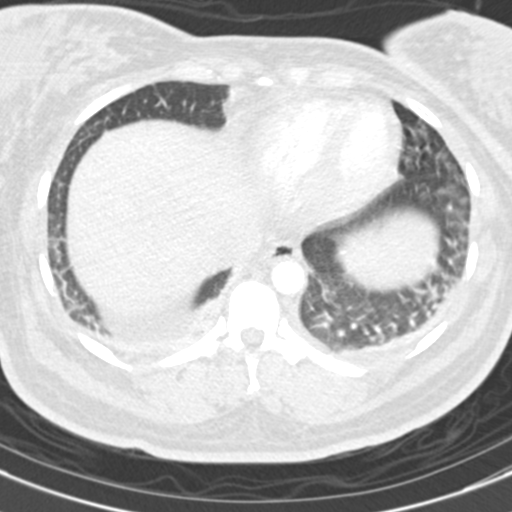
[im 186/441  mediastinal]
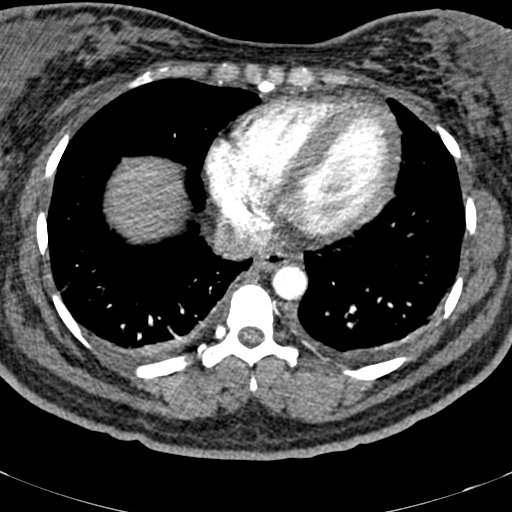
[im 207/441  lung]
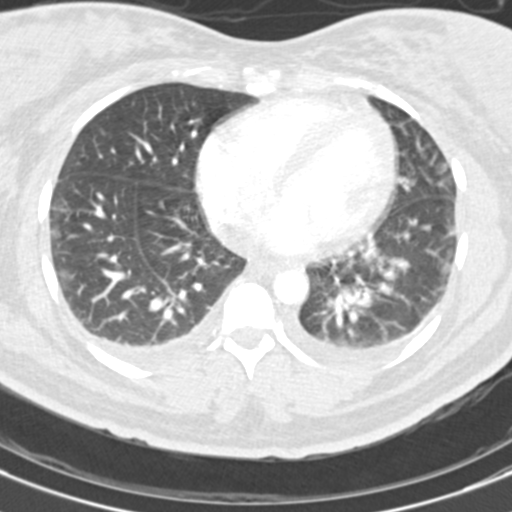
[im 221/441  mediastinal]
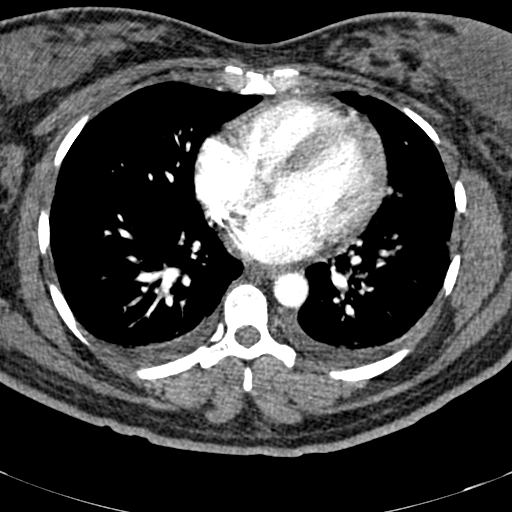
[im 232/441  lung]
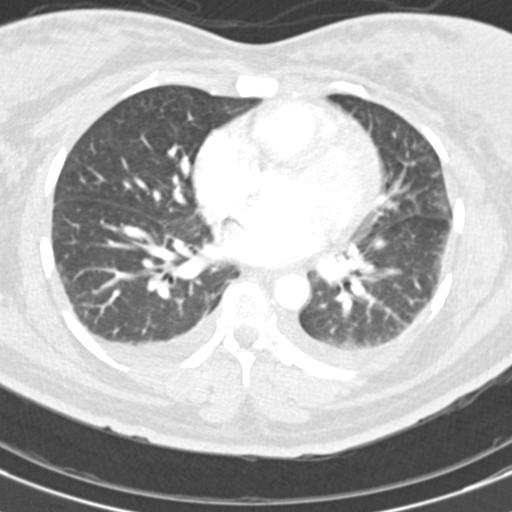
[im 255/441  mediastinal]
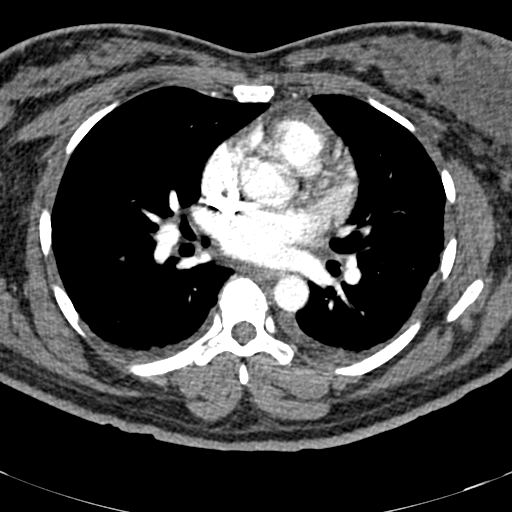
[im 278/441  lung]
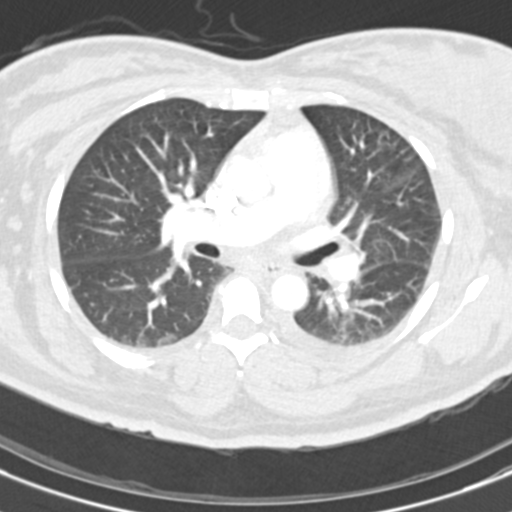
[im 302/441  mediastinal]
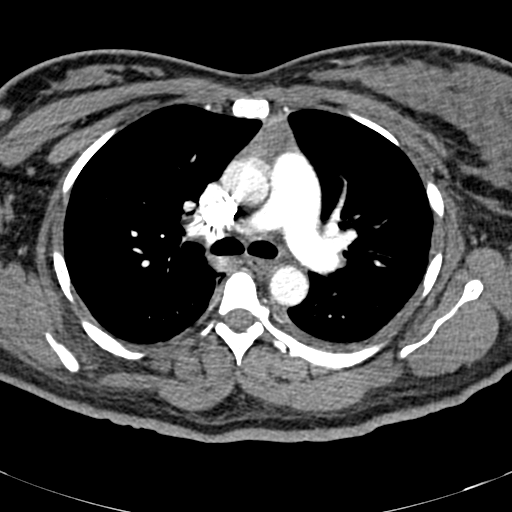
[im 325/441  lung]
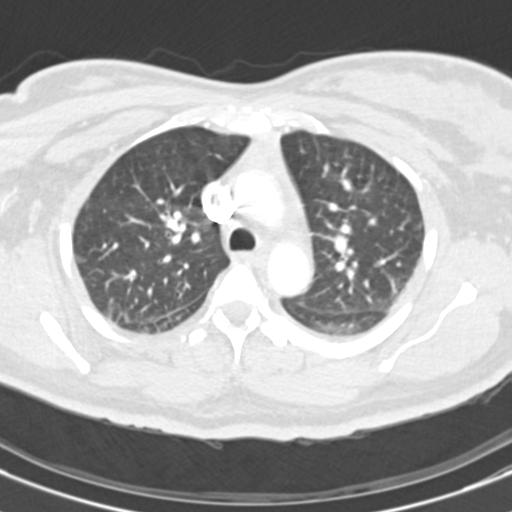
[im 348/441  mediastinal]
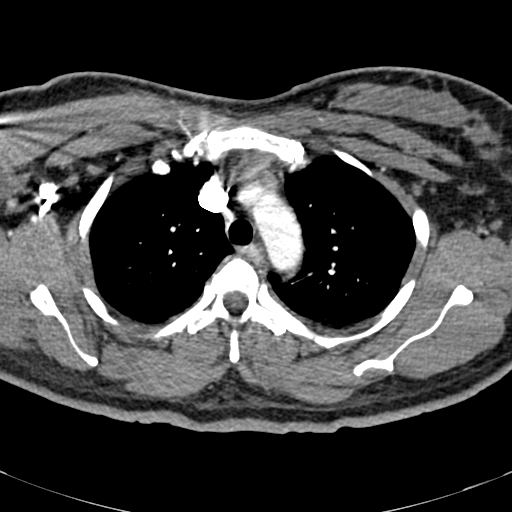
[im 371/441  lung]
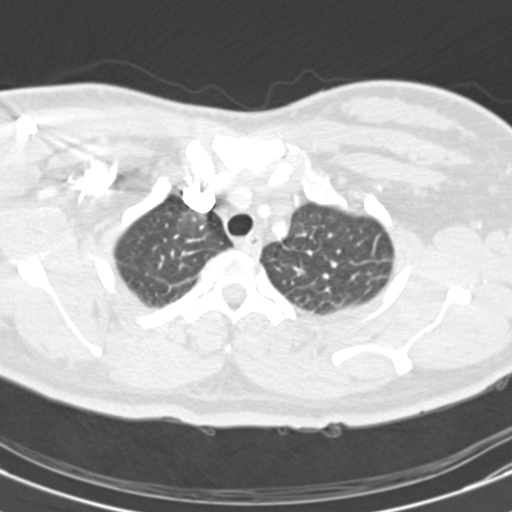
[im 394/441  mediastinal]
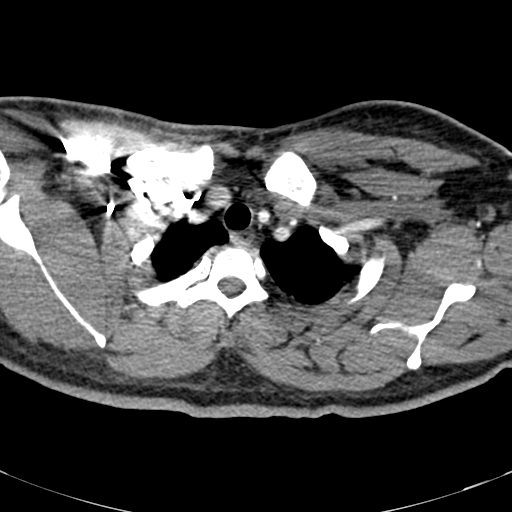
[im 417/441  lung]
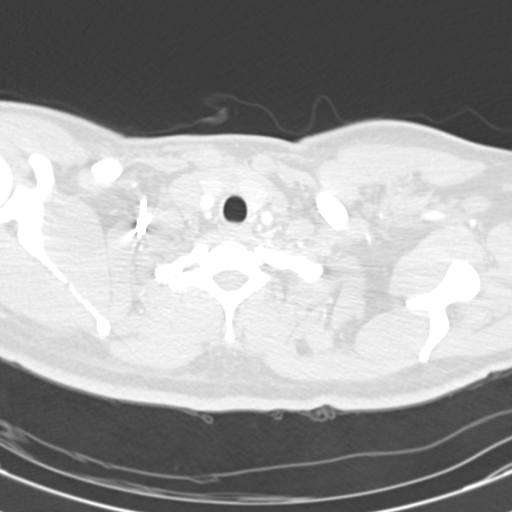

[19 of 31 positions shown; findings below may reference images not displayed]

FINDINGS: Cardiovascular: There are no filling defects within the central
pulmonary arteries to suggest pulmonary embolus. Evaluation of the
submental and subsegmental branches is suboptimal due to contrast
bolus timing. The thoracic aorta is normal in caliber. No aortic
dissection. The heart is normal in size. No pericardial effusion.

Mediastinum/Nodes: No mediastinal or hilar adenopathy. Soft tissue
density in the anterior mediastinum consistent with residual or
recurrent thymus. Multiple prominent left axillary nodes esophagus
is decompressed. No thyroid nodule.

Lungs/Pleura: Small bilateral pleural effusions. Mild basilar and
apical smooth septal thickening suggesting pulmonary edema. No focal
airspace disease. No pneumothorax. The trachea and mainstem bronchi
are patent.

Upper Abdomen: Subcentimeter low-density in the right lobe of the
liver is too small to characterize but likely cyst. No acute
findings.

Musculoskeletal: There are no acute or suspicious osseous
abnormalities.

Review of the MIP images confirms the above findings.
IMPRESSION: 1. No central pulmonary embolus.
2. Small pleural effusions with mild pulmonary edema.
3. Prominent left axillary nodes are likely reactive.

## 2019-10-06 IMAGING — CR DG CHEST 2V
2 series · 2 of 2 positions shown · non-contrast
Comparison: None.

CLINICAL DATA: Lower extremity swelling

EXAM:
CHEST - 2 VIEW

[chest pa]
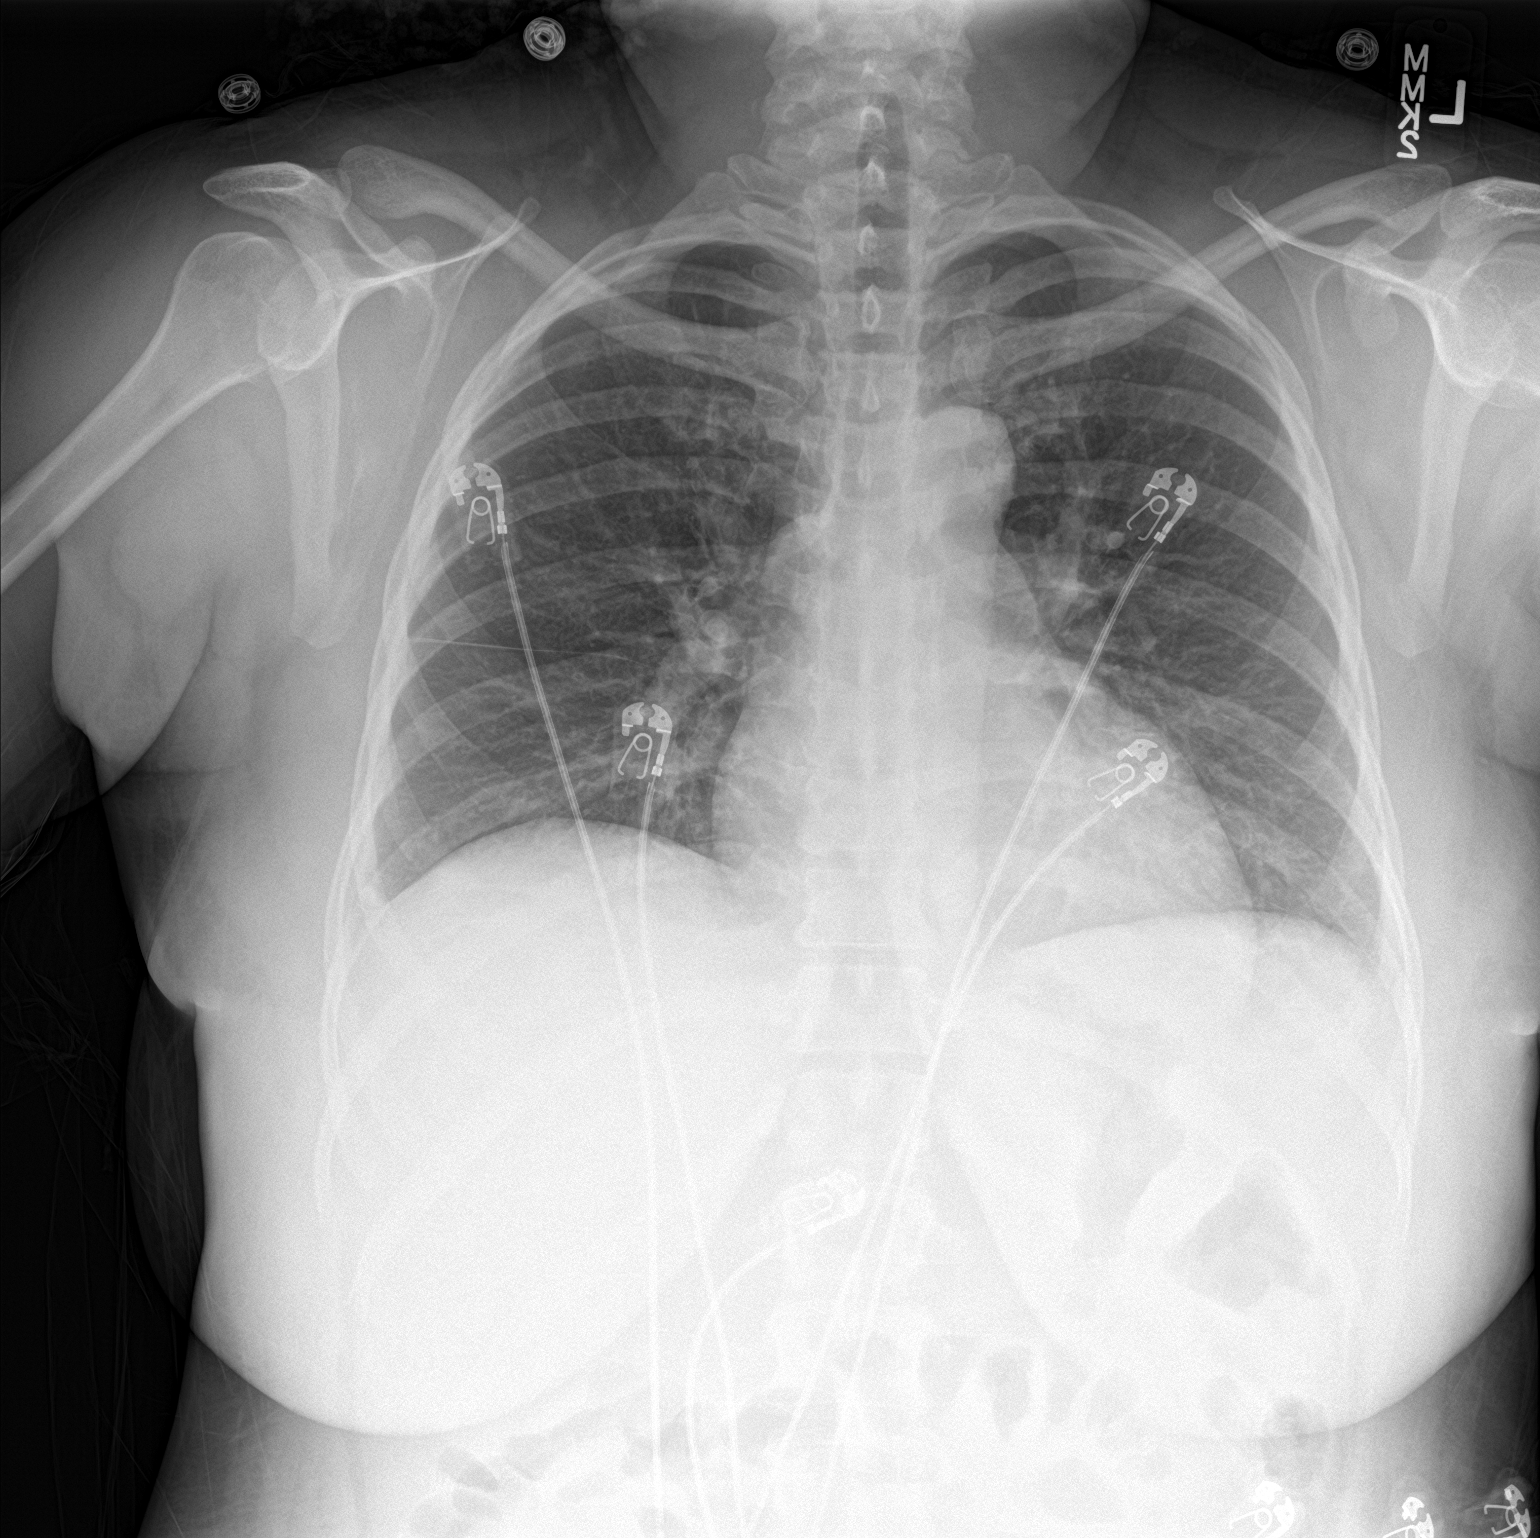

[chest lat]
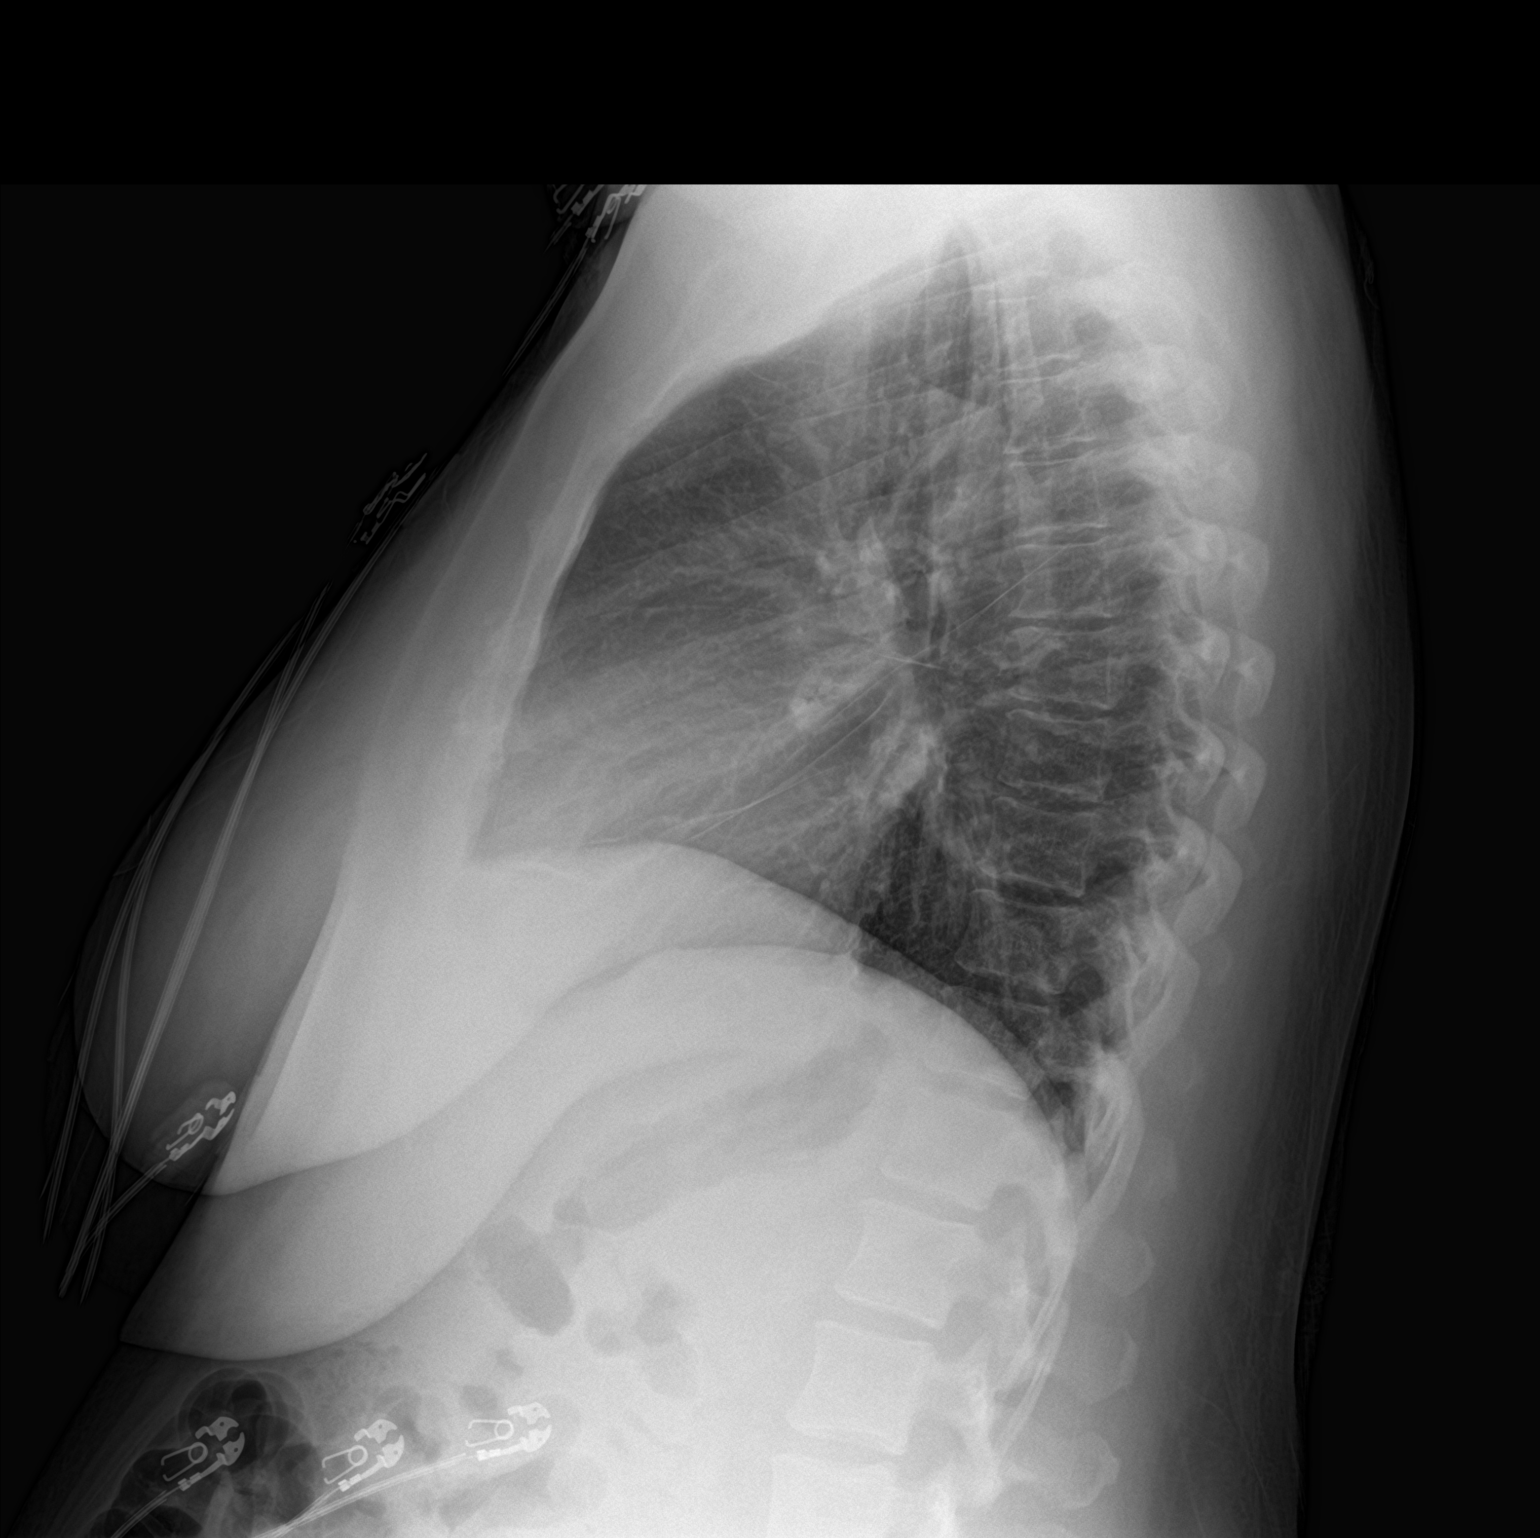

[2 of 2 positions shown; findings below may reference images not displayed]

FINDINGS: The heart size and mediastinal contours are within normal limits.
Both lungs are clear. The visualized skeletal structures are
unremarkable.
IMPRESSION: Clear lungs.

## 2020-05-28 LAB — OB RESULTS CONSOLE ABO/RH: RH Type: NEGATIVE

## 2020-05-28 LAB — OB RESULTS CONSOLE RPR: RPR: NONREACTIVE

## 2020-05-28 LAB — OB RESULTS CONSOLE RUBELLA ANTIBODY, IGM: Rubella: IMMUNE

## 2020-05-28 LAB — OB RESULTS CONSOLE HEPATITIS B SURFACE ANTIGEN: Hepatitis B Surface Ag: NEGATIVE

## 2020-05-28 LAB — OB RESULTS CONSOLE GC/CHLAMYDIA
Chlamydia: NEGATIVE
Gonorrhea: NEGATIVE

## 2020-05-28 LAB — OB RESULTS CONSOLE HIV ANTIBODY (ROUTINE TESTING): HIV: NONREACTIVE

## 2020-05-28 LAB — OB RESULTS CONSOLE ANTIBODY SCREEN: Antibody Screen: NEGATIVE

## 2020-11-19 LAB — OB RESULTS CONSOLE GBS: GBS: POSITIVE

## 2020-12-03 ENCOUNTER — Encounter (HOSPITAL_COMMUNITY): Payer: Self-pay | Admitting: *Deleted

## 2020-12-03 ENCOUNTER — Encounter (HOSPITAL_COMMUNITY): Payer: Self-pay

## 2020-12-03 NOTE — Patient Instructions (Signed)
Heather Bowman  12/03/2020   Your procedure is scheduled on:  12/09/2020  Arrive at [facility] on [address] at [facility]  and [employer]. You are invited to use the FREE valet parking or use the Visitor's parking deck.  Pick up the phone at the desk and dial [phone].  Call this number if you have problems the morning of surgery: [phone]  Remember:   Do not eat food:(After Midnight) Desps de medianoche.  Do not drink clear liquids: (After Midnight) Desps de medianoche.  Take these medicines the morning of surgery with A SIP OF WATER:  Take valtrex as prescribed.  Take 21 units of insulin at bedtime the night before surgery and no insulin on the day of surgery   Do not wear jewelry, make-up or nail polish.  Do not wear lotions, powders, or perfumes. Do not wear deodorant.  Do not shave 48 hours prior to surgery.  Do not bring valuables to the hospital.  [facility] is not   responsible for any belongings or valuables brought to the hospital.  Contacts, dentures or bridgework may not be worn into surgery.  Leave suitcase in the car. After surgery it may be brought to your room.  For patients admitted to the hospital, checkout time is 11:00 AM the day of              discharge.      Please read over the following fact sheets that you were given:     Preparing for Surgery

## 2020-12-07 NOTE — H&P (Signed)
Heather Bowman is a 40 y.o. female G2P1001 at 28 0/7 weeks (EDD 12/15/20 by LMP c/w 11 week [place] presenting for scheduled repeat c-section. Prenatal care significant for:  1) Uterine leiomyoma   2-3cm 2) History of cesarean section   Prior LTCS with 2 layer closure 12/19  Desires repeat c-section 12/10/19 3) History of delivery of macrosomal infant   G1 9#15oz at 39 weeks  Two week NICU stay for hypoglycemia 4) History of gestational diabetes mellitus   On metformin in prior pregnancy with poor  compliance to CBG checks  Baseline HgbA1C 5.5 this pregnancy 5) Advanced maternal age gravida   declines genetic screening 6) RhD negative    rhogam at 28 weeks 7) Gestational diabetes mellitus   by early glucola/GTT  Insulin started 07/24/20  46 units Humulin-N  NPH in pm, 26 units in am  NST's/BPP's at 32 weeks  Growth [place] 11/14/19 55%ile, AFI 15 vtx 8) Group B Streptococcus carrier   9) Mental disorder in mother complicating pregnancy   on abilify and seroquel prior to pregnancy, d/c  with pregnancy  has therapist 10) Placenta previa   Marginal previa on 18 week [place]   RESOLVED on 32 week [place] 11) h/o infertility and IVF   ast pregnancy but conceived   spontaneously for this one  OB History    Gravida  2   Para  1   Term  1   Preterm      AB      Living  1     SAB      IAB      Ectopic      Multiple  0   Live Births  1         10-10-2018, 39.3 wks 1. M, 9lbs 15oz, Cesarean Section  Past Medical History:  Diagnosis Date  . Anxiety   . Arthritis   . Breast mass    right axilla  . Chronic pain syndrome   . Constipation    uses linzess daily   . Depression   . Diabetes mellitus without complication ([place])   . Fibroid   . GERD (gastroesophageal reflux disease)   . IBS (irritable bowel syndrome)   . PCOS (polycystic ovarian syndrome)    on metformin for this   Past Surgical History:  Procedure Laterality Date  . CESAREAN SECTION N/A 10/10/2018   Procedure:  CESAREAN SECTION;  Surgeon: Janyth Contes, MD;  Location: [facility];  Service: Obstetrics;  Laterality: N/A;  . COLONOSCOPY    . KNEE SURGERY    . MYOMECTOMY    . POLYPECTOMY    . right axilla breast mass removal    . WISDOM TOOTH EXTRACTION     Family History: family history includes Asthma in her brother; Breast cancer in her paternal aunt; Cancer in her maternal grandmother; Depression in her sister; Diabetes in her father and sister; Heart disease in her father; Hyperlipidemia in her father and sister; Hypertension in her father, mother, and sister; Kidney disease in her father; Stroke in her father. Social History:  reports that she has never smoked. She has never used smokeless tobacco. She reports current alcohol use. She reports that she does not use drugs.     Maternal Diabetes: Yes:  Diabetes Type:  Insulin/Medication controlled Genetic Screening: Declined Maternal Ultrasounds/Referrals: Normal Fetal Ultrasounds or other Referrals:  None Maternal Substance Abuse:  No Significant Maternal Medications:  Meds include: Protonix Other: Humulin N Significant Maternal Lab Results:  Group B Strep positive Other Comments:  None  Review of Systems  Constitutional: Negative for fever.  Gastrointestinal: Negative for abdominal pain.   Maternal Medical History:  Contractions: Frequency: irregular.   Perceived severity is mild.    Fetal activity: Perceived fetal activity is normal.    Prenatal complications: AMA, GDM, Prior c-section  Prenatal Complications - Diabetes: gestational. Diabetes is managed by insulin injections.        unknown if currently breastfeeding. Maternal Exam:  Uterine Assessment: Contraction strength is mild.  Contraction frequency is irregular.   Abdomen: Patient reports no abdominal tenderness. Surgical scars: low transverse.   Fetal presentation: vertex  Introitus: Normal vulva. Normal vagina.    Physical Exam Cardiovascular:      Rate and Rhythm: Normal rate and regular rhythm.  Pulmonary:     Effort: Pulmonary effort is normal.  Abdominal:     Palpations: Abdomen is soft.  Genitourinary:    General: Normal vulva.  Musculoskeletal:     Cervical back: Normal range of motion.  Neurological:     Mental Status: She is alert.  Psychiatric:        Mood and Affect: Mood normal.     Prenatal labs: ABO, Rh: AB/Negative/-- (07/28 0000) Antibody: Negative (07/28 0000) Rubella: Immune (07/28 0000) RPR: Nonreactive (07/28 0000)  HBsAg: Negative (07/28 0000)  HIV: Non-reactive (07/28 0000)  GBS: Positive/-- (01/19 0000)   Assessment/Plan:   She was instructed to only take 23 units the night before the c-section and skip her AM insulin the day of her surgery.  Reviewed the c-section procedure with patient in detail.  We discussed the risks and benefits including bleeding infection and possible damage to bowel and bladder.  She is ok with a transfusion in the event of an emergency.  Ready to proceed 12/09/20 Declines tubal sterilization [name] 12/07/2020, 8:16 AM

## 2020-12-08 ENCOUNTER — Encounter (HOSPITAL_COMMUNITY)
Admission: RE | Admit: 2020-12-08 | Discharge: 2020-12-08 | Disposition: A | Discharge status: Home / Self Care | Payer: Medicare Other | Source: Ambulatory Visit | Attending: Obstetrics and Gynecology | Admitting: Obstetrics and Gynecology

## 2020-12-08 ENCOUNTER — Other Ambulatory Visit (HOSPITAL_COMMUNITY)
Admission: RE | Admit: 2020-12-08 | Discharge: 2020-12-08 | Disposition: A | Discharge status: Home / Self Care | Payer: Medicare Other | Source: Ambulatory Visit | Attending: Obstetrics and Gynecology | Admitting: Obstetrics and Gynecology

## 2020-12-08 ENCOUNTER — Other Ambulatory Visit: Payer: Self-pay

## 2020-12-08 DIAGNOSIS — Z01812 Encounter for preprocedural laboratory examination: Secondary | ICD-10-CM | POA: Insufficient documentation

## 2020-12-08 DIAGNOSIS — Z20822 Contact with and (suspected) exposure to covid-19: Secondary | ICD-10-CM | POA: Insufficient documentation

## 2020-12-08 LAB — CBC
HCT: 40.7 % (ref 36.0–46.0)
Hemoglobin: 12.4 g/dL (ref 12.0–15.0)
MCH: 24.6 pg — ABNORMAL LOW (ref 26.0–34.0)
MCHC: 30.5 g/dL (ref 30.0–36.0)
MCV: 80.8 fL (ref 80.0–100.0)
Platelets: 204 10*3/uL (ref 150–400)
RBC: 5.04 MIL/uL (ref 3.87–5.11)
RDW: 20 % — ABNORMAL HIGH (ref 11.5–15.5)
WBC: 8.7 10*3/uL (ref 4.0–10.5)
nRBC: 0.7 % — ABNORMAL HIGH (ref 0.0–0.2)

## 2020-12-08 LAB — TYPE AND SCREEN
ABO/RH(D): AB NEG
Antibody Screen: POSITIVE

## 2020-12-08 LAB — BASIC METABOLIC PANEL
Anion gap: 10 (ref 5–15)
BUN: 7 mg/dL (ref 6–20)
CO2: 21 mmol/L — ABNORMAL LOW (ref 22–32)
Calcium: 8.9 mg/dL (ref 8.9–10.3)
Chloride: 103 mmol/L (ref 98–111)
Creatinine, Ser: 0.62 mg/dL (ref 0.44–1.00)
GFR, Estimated: >60 mL/min (ref >60)
Glucose, Bld: 180 mg/dL — ABNORMAL HIGH (ref 70–99)
Potassium: 4.1 mmol/L (ref 3.5–5.1)
Sodium: 134 mmol/L — ABNORMAL LOW (ref 135–145)

## 2020-12-08 LAB — SARS CORONAVIRUS 2 (TAT 6-24 HRS): SARS Coronavirus 2: NEGATIVE

## 2020-12-08 NOTE — Anesthesia Preprocedure Evaluation (Signed)
Anesthesia Evaluation  Patient identified by MRN, date of birth, ID band Patient awake    Reviewed: Allergy & Precautions, NPO status , Patient's Chart, lab work & pertinent test results  History of Anesthesia Complications Negative for: history of anesthetic complications  Airway Mallampati: III  TM Distance: >3 FB Neck ROM: Full    Dental  (+) Missing,    Pulmonary neg pulmonary ROS,    Pulmonary exam normal        Cardiovascular negative cardio ROS Normal cardiovascular exam     Neuro/Psych Anxiety Depression negative neurological ROS     GI/Hepatic Neg liver ROS, GERD  Controlled,  Endo/Other  diabetes, Type 2, Insulin Dependent  Renal/GU negative Renal ROS  negative genitourinary   Musculoskeletal negative musculoskeletal ROS (+)   Abdominal   Peds  Hematology negative hematology ROS (+)   Anesthesia Other Findings Day of surgery medications reviewed with patient.  Reproductive/Obstetrics (+) Pregnancy (hx of C/Sx1)                            Anesthesia Physical Anesthesia Plan  ASA: II  Anesthesia Plan: Spinal   Post-op Pain Management:    Induction:   PONV Risk Score and Plan: 4 or greater and Treatment may vary due to age or medical condition, Ondansetron and Dexamethasone  Airway Management Planned: Natural Airway  Additional Equipment: None  Intra-op Plan:   Post-operative Plan:   Informed Consent: I have reviewed the patients History and Physical, chart, labs and discussed the procedure including the risks, benefits and alternatives for the proposed anesthesia with the patient or authorized representative who has indicated his/her understanding and acceptance.       Plan Discussed with: CRNA  Anesthesia Plan Comments:        Anesthesia Quick Evaluation

## 2020-12-09 ENCOUNTER — Other Ambulatory Visit: Payer: Self-pay

## 2020-12-09 ENCOUNTER — Inpatient Hospital Stay (HOSPITAL_COMMUNITY): Payer: Medicare Other | Admitting: Anesthesiology

## 2020-12-09 ENCOUNTER — Encounter (HOSPITAL_COMMUNITY): Payer: Self-pay | Admitting: Obstetrics and Gynecology

## 2020-12-09 ENCOUNTER — Inpatient Hospital Stay (HOSPITAL_COMMUNITY)
Admission: RE | Admit: 2020-12-09 | Discharge: 2020-12-11 | DRG: 788 | Disposition: A | Discharge status: Home / Self Care | Payer: Medicare Other | Attending: Obstetrics and Gynecology | Admitting: Obstetrics and Gynecology

## 2020-12-09 ENCOUNTER — Encounter (HOSPITAL_COMMUNITY)
Admission: RE | Disposition: A | Discharge status: Home / Self Care | Payer: Self-pay | Source: Home / Self Care | Attending: Obstetrics and Gynecology

## 2020-12-09 DIAGNOSIS — Z6791 Unspecified blood type, Rh negative: Secondary | ICD-10-CM

## 2020-12-09 DIAGNOSIS — Z3A39 39 weeks gestation of pregnancy: Secondary | ICD-10-CM | POA: Diagnosis not present

## 2020-12-09 DIAGNOSIS — D259 Leiomyoma of uterus, unspecified: Secondary | ICD-10-CM | POA: Diagnosis present

## 2020-12-09 DIAGNOSIS — O34211 Maternal care for low transverse scar from previous cesarean delivery: Secondary | ICD-10-CM | POA: Diagnosis present

## 2020-12-09 DIAGNOSIS — O99824 Streptococcus B carrier state complicating childbirth: Secondary | ICD-10-CM | POA: Diagnosis present

## 2020-12-09 DIAGNOSIS — K219 Gastro-esophageal reflux disease without esophagitis: Secondary | ICD-10-CM | POA: Diagnosis present

## 2020-12-09 DIAGNOSIS — Z98891 History of uterine scar from previous surgery: Secondary | ICD-10-CM

## 2020-12-09 DIAGNOSIS — F32A Depression, unspecified: Secondary | ICD-10-CM | POA: Diagnosis present

## 2020-12-09 DIAGNOSIS — O99344 Other mental disorders complicating childbirth: Secondary | ICD-10-CM | POA: Diagnosis present

## 2020-12-09 DIAGNOSIS — O9962 Diseases of the digestive system complicating childbirth: Secondary | ICD-10-CM | POA: Diagnosis present

## 2020-12-09 DIAGNOSIS — Z20822 Contact with and (suspected) exposure to covid-19: Secondary | ICD-10-CM | POA: Diagnosis present

## 2020-12-09 DIAGNOSIS — F419 Anxiety disorder, unspecified: Secondary | ICD-10-CM | POA: Diagnosis present

## 2020-12-09 DIAGNOSIS — O26893 Other specified pregnancy related conditions, third trimester: Secondary | ICD-10-CM | POA: Diagnosis present

## 2020-12-09 DIAGNOSIS — O3413 Maternal care for benign tumor of corpus uteri, third trimester: Secondary | ICD-10-CM | POA: Diagnosis present

## 2020-12-09 DIAGNOSIS — O24424 Gestational diabetes mellitus in childbirth, insulin controlled: Secondary | ICD-10-CM | POA: Diagnosis present

## 2020-12-09 LAB — GLUCOSE, CAPILLARY
Glucose-Capillary: 110 mg/dL — ABNORMAL HIGH (ref 70–99)
Glucose-Capillary: 112 mg/dL — ABNORMAL HIGH (ref 70–99)

## 2020-12-09 LAB — RPR: RPR Ser Ql: NONREACTIVE

## 2020-12-09 SURGERY — Surgical Case
Anesthesia: Spinal

## 2020-12-09 MED ORDER — BUPIVACAINE IN DEXTROSE 0.75-8.25 % IT SOLN
INTRATHECAL | Status: DC | PRN
  Administered 2020-12-09: 1.6 mL via INTRATHECAL

## 2020-12-09 MED ORDER — SIMETHICONE 80 MG PO CHEW
80.0000 mg | CHEWABLE_TABLET | Freq: Three times a day (TID) | ORAL | Status: DC
  Administered 2020-12-09 – 2020-12-11 (×7): 80 mg via ORAL
  Filled 2020-12-09 (×7): qty 1

## 2020-12-09 MED ORDER — CEFAZOLIN SODIUM-DEXTROSE 2-4 GM/100ML-% IV SOLN
2.0000 g | INTRAVENOUS | Status: AC
  Administered 2020-12-09: 2 g via INTRAVENOUS

## 2020-12-09 MED ORDER — KETOROLAC TROMETHAMINE 30 MG/ML IJ SOLN
30.0000 mg | Freq: Once | INTRAMUSCULAR | Status: AC
  Administered 2020-12-09: 30 mg via INTRAVENOUS

## 2020-12-09 MED ORDER — LACTATED RINGERS IV SOLN: INTRAVENOUS | Status: DC

## 2020-12-09 MED ORDER — DIPHENHYDRAMINE HCL 25 MG PO CAPS: 25.0000 mg | ORAL_CAPSULE | Freq: Four times a day (QID) | ORAL | Status: DC | PRN

## 2020-12-09 MED ORDER — CEFAZOLIN SODIUM-DEXTROSE 2-4 GM/100ML-% IV SOLN
INTRAVENOUS | Status: AC
  Filled 2020-12-09: qty 100

## 2020-12-09 MED ORDER — SENNOSIDES-DOCUSATE SODIUM 8.6-50 MG PO TABS
2.0000 | ORAL_TABLET | Freq: Every day | ORAL | Status: DC
  Administered 2020-12-10 – 2020-12-11 (×2): 2 via ORAL
  Filled 2020-12-09 (×2): qty 2

## 2020-12-09 MED ORDER — ONDANSETRON HCL 4 MG/2ML IJ SOLN
INTRAMUSCULAR | Status: DC | PRN
  Administered 2020-12-09: 4 mg via INTRAVENOUS

## 2020-12-09 MED ORDER — TETANUS-DIPHTH-ACELL PERTUSSIS 5-2.5-18.5 LF-MCG/0.5 IM SUSY: 0.5000 mL | PREFILLED_SYRINGE | Freq: Once | INTRAMUSCULAR | Status: DC

## 2020-12-09 MED ORDER — FENTANYL CITRATE (PF) 100 MCG/2ML IJ SOLN
INTRAMUSCULAR | Status: AC
  Filled 2020-12-09: qty 2

## 2020-12-09 MED ORDER — OXYTOCIN-SODIUM CHLORIDE 30-0.9 UT/500ML-% IV SOLN: 2.5000 [IU]/h | INTRAVENOUS | Status: AC

## 2020-12-09 MED ORDER — ONDANSETRON HCL 4 MG/2ML IJ SOLN
4.0000 mg | Freq: Three times a day (TID) | INTRAMUSCULAR | Status: DC | PRN
Start: 2020-12-09 — End: 2020-12-11

## 2020-12-09 MED ORDER — PHENYLEPHRINE HCL-NACL 20-0.9 MG/250ML-% IV SOLN
INTRAVENOUS | Status: AC
  Filled 2020-12-09: qty 250

## 2020-12-09 MED ORDER — SOD CITRATE-CITRIC ACID 500-334 MG/5ML PO SOLN
ORAL | Status: AC
  Filled 2020-12-09: qty 30

## 2020-12-09 MED ORDER — NALBUPHINE HCL 10 MG/ML IJ SOLN: 5.0000 mg | Freq: Once | INTRAMUSCULAR | Status: DC | PRN

## 2020-12-09 MED ORDER — MORPHINE SULFATE (PF) 0.5 MG/ML IJ SOLN
INTRAMUSCULAR | Status: AC
  Filled 2020-12-09: qty 10

## 2020-12-09 MED ORDER — NALBUPHINE HCL 10 MG/ML IJ SOLN
5.0000 mg | INTRAMUSCULAR | Status: DC | PRN
  Administered 2020-12-10: 5 mg via INTRAVENOUS
  Filled 2020-12-09: qty 1

## 2020-12-09 MED ORDER — PHENYLEPHRINE HCL-NACL 20-0.9 MG/250ML-% IV SOLN
INTRAVENOUS | Status: DC | PRN
  Administered 2020-12-09: 60 ug/min via INTRAVENOUS

## 2020-12-09 MED ORDER — KETOROLAC TROMETHAMINE 30 MG/ML IJ SOLN: 30.0000 mg | Freq: Four times a day (QID) | INTRAMUSCULAR | Status: AC | PRN

## 2020-12-09 MED ORDER — ACETAMINOPHEN 500 MG PO TABS
1000.0000 mg | ORAL_TABLET | Freq: Four times a day (QID) | ORAL | Status: DC
  Filled 2020-12-09: qty 2

## 2020-12-09 MED ORDER — DIPHENHYDRAMINE HCL 50 MG/ML IJ SOLN: 12.5000 mg | Freq: Four times a day (QID) | INTRAMUSCULAR | Status: DC | PRN

## 2020-12-09 MED ORDER — SIMETHICONE 80 MG PO CHEW
80.0000 mg | CHEWABLE_TABLET | ORAL | Status: DC | PRN
  Administered 2020-12-11: 80 mg via ORAL
  Filled 2020-12-09: qty 1

## 2020-12-09 MED ORDER — DEXAMETHASONE SODIUM PHOSPHATE 4 MG/ML IJ SOLN
INTRAMUSCULAR | Status: DC | PRN
  Administered 2020-12-09: 10 mg via INTRAVENOUS

## 2020-12-09 MED ORDER — OXYCODONE HCL 5 MG PO TABS
5.0000 mg | ORAL_TABLET | ORAL | Status: DC | PRN
  Administered 2020-12-09 (×2): 5 mg via ORAL
  Administered 2020-12-10 (×2): 10 mg via ORAL
  Administered 2020-12-10: 5 mg via ORAL
  Administered 2020-12-11: 10 mg via ORAL
  Filled 2020-12-09 (×2): qty 2
  Filled 2020-12-09 (×2): qty 1
  Filled 2020-12-09: qty 2
  Filled 2020-12-09: qty 1

## 2020-12-09 MED ORDER — COCONUT OIL OIL: 1.0000 "application " | TOPICAL_OIL | Status: DC | PRN

## 2020-12-09 MED ORDER — SOD CITRATE-CITRIC ACID 500-334 MG/5ML PO SOLN
30.0000 mL | Freq: Once | ORAL | Status: AC
  Administered 2020-12-09: 30 mL via ORAL

## 2020-12-09 MED ORDER — NALOXONE HCL 4 MG/10ML IJ SOLN
1.0000 ug/kg/h | INTRAVENOUS | Status: DC | PRN
  Filled 2020-12-09: qty 5

## 2020-12-09 MED ORDER — FENTANYL CITRATE (PF) 100 MCG/2ML IJ SOLN
25.0000 ug | INTRAMUSCULAR | Status: DC | PRN
Start: 2020-12-09 — End: 2020-12-09

## 2020-12-09 MED ORDER — POVIDONE-IODINE 10 % EX SWAB: 2.0000 "application " | Freq: Once | CUTANEOUS | Status: DC

## 2020-12-09 MED ORDER — NALOXONE HCL 0.4 MG/ML IJ SOLN
0.4000 mg | INTRAMUSCULAR | Status: DC | PRN
Start: 2020-12-09 — End: 2020-12-11

## 2020-12-09 MED ORDER — IBUPROFEN 800 MG PO TABS: 800.0000 mg | ORAL_TABLET | Freq: Three times a day (TID) | ORAL | Status: DC

## 2020-12-09 MED ORDER — IBUPROFEN 100 MG/5ML PO SUSP
800.0000 mg | Freq: Three times a day (TID) | ORAL | Status: DC
  Administered 2020-12-09 – 2020-12-11 (×7): 800 mg via ORAL
  Filled 2020-12-09 (×7): qty 40

## 2020-12-09 MED ORDER — ZOLPIDEM TARTRATE 5 MG PO TABS: 5.0000 mg | ORAL_TABLET | Freq: Every evening | ORAL | Status: DC | PRN

## 2020-12-09 MED ORDER — DIPHENHYDRAMINE HCL 25 MG PO CAPS
25.0000 mg | ORAL_CAPSULE | Freq: Four times a day (QID) | ORAL | Status: DC | PRN
  Administered 2020-12-10: 25 mg via ORAL
  Filled 2020-12-09: qty 1

## 2020-12-09 MED ORDER — SODIUM CHLORIDE 0.9% FLUSH: 3.0000 mL | INTRAVENOUS | Status: DC | PRN

## 2020-12-09 MED ORDER — NALBUPHINE HCL 10 MG/ML IJ SOLN: 5.0000 mg | INTRAMUSCULAR | Status: DC | PRN

## 2020-12-09 MED ORDER — OXYTOCIN-SODIUM CHLORIDE 30-0.9 UT/500ML-% IV SOLN
INTRAVENOUS | Status: AC
  Filled 2020-12-09: qty 500

## 2020-12-09 MED ORDER — ACETAMINOPHEN 160 MG/5ML PO SOLN: 1000.0000 mg | Freq: Once | ORAL | Status: DC

## 2020-12-09 MED ORDER — PRENATAL MULTIVITAMIN CH
1.0000 | ORAL_TABLET | Freq: Every day | ORAL | Status: DC
  Administered 2020-12-11: 1 via ORAL
  Filled 2020-12-09: qty 1

## 2020-12-09 MED ORDER — MENTHOL 3 MG MT LOZG: 1.0000 | LOZENGE | OROMUCOSAL | Status: DC | PRN

## 2020-12-09 MED ORDER — ACETAMINOPHEN 500 MG PO TABS: 1000.0000 mg | ORAL_TABLET | Freq: Once | ORAL | Status: DC

## 2020-12-09 MED ORDER — ACETAMINOPHEN 500 MG PO TABS
1000.0000 mg | ORAL_TABLET | Freq: Four times a day (QID) | ORAL | Status: DC
  Administered 2020-12-09 – 2020-12-11 (×6): 1000 mg via ORAL
  Filled 2020-12-09 (×5): qty 2

## 2020-12-09 MED ORDER — PROMETHAZINE HCL 25 MG/ML IJ SOLN: 6.2500 mg | INTRAMUSCULAR | Status: DC | PRN

## 2020-12-09 MED ORDER — FENTANYL CITRATE (PF) 100 MCG/2ML IJ SOLN
INTRAMUSCULAR | Status: DC | PRN
  Administered 2020-12-09: 15 ug via INTRATHECAL

## 2020-12-09 MED ORDER — OXYTOCIN-SODIUM CHLORIDE 30-0.9 UT/500ML-% IV SOLN
INTRAVENOUS | Status: DC | PRN
  Administered 2020-12-09: 200 m[IU]/min via INTRAVENOUS
  Administered 2020-12-09: 500 mL via INTRAVENOUS

## 2020-12-09 MED ORDER — MORPHINE SULFATE (PF) 0.5 MG/ML IJ SOLN
INTRAMUSCULAR | Status: DC | PRN
  Administered 2020-12-09: 150 ug via INTRATHECAL

## 2020-12-09 SURGICAL SUPPLY — 35 items
APL SKNCLS STERI-STRIP NONHPOA (GAUZE/BANDAGES/DRESSINGS) ×1
BENZOIN TINCTURE PRP APPL 2/3 (GAUZE/BANDAGES/DRESSINGS) ×1 IMPLANT
CHLORAPREP W/TINT 26ML (MISCELLANEOUS) ×2 IMPLANT
CLAMP CORD UMBIL (MISCELLANEOUS) IMPLANT
CLOTH BEACON ORANGE TIMEOUT ST (SAFETY) ×2 IMPLANT
DRSG OPSITE POSTOP 4X10 (GAUZE/BANDAGES/DRESSINGS) ×2 IMPLANT
ELECT REM PT RETURN 9FT ADLT (ELECTROSURGICAL) ×2
ELECTRODE REM PT RTRN 9FT ADLT (ELECTROSURGICAL) ×1 IMPLANT
EXTRACTOR VACUUM KIWI (MISCELLANEOUS) IMPLANT
GLOVE BIO SURGEON STRL SZ 6.5 (GLOVE) ×2 IMPLANT
GLOVE BIOGEL PI IND STRL 7.0 (GLOVE) ×1 IMPLANT
GLOVE BIOGEL PI INDICATOR 7.0 (GLOVE) ×1
GOWN STRL REUS W/TWL LRG LVL3 (GOWN DISPOSABLE) ×4 IMPLANT
KIT ABG SYR 3ML LUER SLIP (SYRINGE) IMPLANT
NDL HYPO 25X5/8 SAFETYGLIDE (NEEDLE) IMPLANT
NEEDLE HYPO 25X5/8 SAFETYGLIDE (NEEDLE) IMPLANT
NS IRRIG 1000ML POUR BTL (IV SOLUTION) ×2 IMPLANT
PACK C SECTION WH (CUSTOM PROCEDURE TRAY) ×2 IMPLANT
PAD OB MATERNITY 4.3X12.25 (PERSONAL CARE ITEMS) ×2 IMPLANT
PENCIL SMOKE EVAC W/HOLSTER (ELECTROSURGICAL) ×2 IMPLANT
RTRCTR C-SECT PINK 25CM LRG (MISCELLANEOUS) ×2 IMPLANT
STRIP CLOSURE SKIN 1/2X4 (GAUZE/BANDAGES/DRESSINGS) ×1 IMPLANT
SUT CHROMIC 1 CTX 36 (SUTURE) ×4 IMPLANT
SUT PLAIN 0 NONE (SUTURE) IMPLANT
SUT PLAIN 2 0 XLH (SUTURE) ×2 IMPLANT
SUT VIC AB 0 CT1 27 (SUTURE) ×6
SUT VIC AB 0 CT1 27XBRD ANBCTR (SUTURE) ×2 IMPLANT
SUT VIC AB 2-0 CT1 27 (SUTURE) ×4
SUT VIC AB 2-0 CT1 TAPERPNT 27 (SUTURE) ×1 IMPLANT
SUT VIC AB 3-0 CT1 27 (SUTURE)
SUT VIC AB 3-0 CT1 TAPERPNT 27 (SUTURE) IMPLANT
SUT VIC AB 4-0 KS 27 (SUTURE) ×2 IMPLANT
TOWEL OR 17X24 6PK STRL BLUE (TOWEL DISPOSABLE) ×2 IMPLANT
TRAY FOLEY W/BAG SLVR 14FR LF (SET/KITS/TRAYS/PACK) ×2 IMPLANT
WATER STERILE IRR 1000ML POUR (IV SOLUTION) ×2 IMPLANT

## 2020-12-09 NOTE — Anesthesia Procedure Notes (Signed)
Spinal  Patient location during procedure: OR Start time: 12/09/2020 7:24 AM End time: 12/09/2020 7:26 AM Staffing Performed: anesthesiologist  Anesthesiologist: Brennan Bailey, MD Preanesthetic Checklist Completed: patient identified, IV checked, risks and benefits discussed, monitors and equipment checked, pre-op evaluation and timeout performed Spinal Block Patient position: sitting Prep: DuraPrep and site prepped and draped Patient monitoring: heart rate, continuous pulse ox and blood pressure Approach: midline Location: L3-4 Injection technique: single-shot Needle Needle type: Pencan  Needle gauge: 24 G Needle length: 10 cm Assessment Sensory level: T4 Additional Notes Risks, benefits, and alternative discussed. Patient gave consent to procedure. Prepped and draped in sitting position. Clear CSF obtained after one needle redirection. Positive terminal aspiration. No pain or paraesthesias with injection. Patient tolerated procedure well. Vital signs stable. Tawny Asal, MD

## 2020-12-09 NOTE — Transfer of Care (Signed)
Immediate Anesthesia Transfer of Care Note  Patient: Heather Bowman  Procedure(s) Performed: CESAREAN SECTION (N/A )  Patient Location: PACU  Anesthesia Type:MAC and Spinal  Level of Consciousness: awake, alert  and oriented  Airway & Oxygen Therapy: Patient Spontanous Breathing  Post-op Assessment: Report given to RN and Post -op Vital signs reviewed and stable  Post vital signs: Reviewed and stable  Last Vitals:  Vitals Value Taken Time  BP 118/78 12/09/20 0848  Temp    Pulse 95 12/09/20 0849  Resp 15 12/09/20 0849  SpO2 100 % 12/09/20 0849  Vitals shown include unvalidated device data.  Last Pain:  Vitals:   12/09/20 0619  TempSrc: Oral         Complications: No complications documented.

## 2020-12-09 NOTE — Interval H&P Note (Signed)
History and Physical Interval Note:  12/09/2020 7:10 AM  Heather Bowman  has presented today for surgery, with the diagnosis of repeat C-Section.  The various methods of treatment have been discussed with the patient and family. After consideration of risks, benefits and other options for treatment, the patient has consented to  Procedure(s) with comments: CESAREAN SECTION (N/A) - request RNFA if one by then as a surgical intervention.  The patient's history has been reviewed, patient examined, no change in status, stable for surgery.  I have reviewed the patient's chart and labs.  Questions were answered to the patient's satisfaction.     [name]

## 2020-12-09 NOTE — Op Note (Signed)
Operative Note    Preoperative Diagnosis Term pregnancy at 39+ weeks A2 Gestational Diabetes--well-controlled Fibroid uterus AMA Prior c-section, declines trial of labor  Postoperative Diagnosis same  Procedure Repeat low transverse c-section with 2 layer closure of uterus  Surgeon Paula Compton, MD  Anesthesia Spinal   Fluids: EBL 622mL UOP 170mL clear IVF 2034mlLR  Findings There was a viable female infant in the vertex presentation.  Apgars 9,9.  Weight pending.  Uterus had several small fibroids, 3-4 cm in size.  Ovaries had appearance of PCOS.    Specimen Placenta to L&D  Procedure Note Patient was taken to the operating room where spinal anesthesia was obtained and found to be adequate by Allis clamp test. She was prepped and draped in the normal sterile fashion in the dorsal supine position with a leftward tilt. An appropriate time out was performed. A Pfannenstiel skin incision was then made through a pre-existing scar with the scalpel and carried through to the underlying layer of fascia by sharp dissection and Bovie cautery. The fascia was nicked in the midline and the incision was extended laterally with Mayo scissors. The inferior aspect of the incision was grasped Coker clamps and dissected off the underlying rectus muscles. In a similar fashion the superior aspect was dissected off the rectus muscles. Rectus muscles were separated in the midline and the peritoneal cavity entered sharply and there was some scarring of the rectus muscles at the midline. The peritoneal incision was then extended both superiorly and inferiorly with careful attention to avoid both bowel and bladder. The Alexis self-retaining wound retractor was then placed within the incision and the lower uterine segment exposed. The bladder flap was developed with Metzenbaum scissors and pushed away from the lower uterine segment. The lower uterine segment was then incised in a transverse fashion and  the cavity itself entered bluntly. The incision was extended bluntly. The infant's head was then lifted and delivered from the incision without difficulty. The remainder of the infant delivered and the nose and mouth bulb suctioned with the cord clamped and cut as well. The infant was handed off to the waiting pediatricians. The placenta was then spontaneously expressed from the uterus and the uterus cleared of all clots and debris with moist lap sponge. The uterine incision was then repaired in 2 layers the first layer was a running locked layer of 1-0 chromic and the second an imbricating layer of the same suture. The tubes and ovaries were inspected and the gutters cleared of all clots and debris. The uterine incision was inspected and found to be hemostatic. All instruments and sponges as well as the Alexis retractor were then removed from the abdomen. The rectus muscles and peritoneum were then reapproximated with a running suture of 2-0 Vicryl. The fascia was then closed with 0 Vicryl in a running fashion. Subcutaneous tissue was reapproximated with 3-0 plain in a running fashion. The skin was closed with a subcuticular stitch of 4-0 Vicryl on a Keith needle and then reinforced with benzoin and Steri-Strips. At the conclusion of the procedure all instruments and sponge counts were correct. Patient was taken to the recovery room in good condition with her baby accompanying her skin to skin.

## 2020-12-09 NOTE — Progress Notes (Signed)
[name] was referred for history of depression/anxiety. * Referral screened out by Clinical Social Worker because none of the following criteria appear to apply: ~ History of anxiety/depression during this pregnancy, or of post-partum depression following prior delivery. ~ Diagnosis of anxiety and/or depression within last 3 years. Per further chart review, [name] was diagnosed with  anxiety and depression in 2011 per [facility] records.  OR * [name]'s symptoms currently being treated with medication and/or therapy. Per further chart review, [name] also has a hx of medication use for anxiety and depression however [name] ceased with confirmation of pregnancy and is seeing therapist.     Please contact the Clinical Social Worker if needs arise, by [facility] request, or if [name] scores greater than 9/yes to question 10 on Edinburgh Postpartum Depression Screen.     Heather Bowman, MSW, LCSW [facility] [phone]

## 2020-12-09 NOTE — Anesthesia Postprocedure Evaluation (Signed)
Anesthesia Post Note  Patient: Heather Bowman  Procedure(s) Performed: CESAREAN SECTION (N/A )     Patient location during evaluation: PACU Anesthesia Type: Spinal Level of consciousness: awake and alert and oriented Pain management: pain level controlled Vital Signs Assessment: post-procedure vital signs reviewed and stable Respiratory status: spontaneous breathing, nonlabored ventilation and respiratory function stable Cardiovascular status: blood pressure returned to baseline Postop Assessment: no apparent nausea or vomiting, spinal receding, no headache and no backache Anesthetic complications: no   No complications documented.      Brennan Bailey

## 2020-12-10 LAB — BIRTH TISSUE RECOVERY COLLECTION (PLACENTA DONATION)

## 2020-12-10 LAB — CBC
HCT: 29.3 % — ABNORMAL LOW (ref 36.0–46.0)
Hemoglobin: 8.8 g/dL — ABNORMAL LOW (ref 12.0–15.0)
MCH: 24.4 pg — ABNORMAL LOW (ref 26.0–34.0)
MCHC: 30 g/dL (ref 30.0–36.0)
MCV: 81.4 fL (ref 80.0–100.0)
Platelets: 147 10*3/uL — ABNORMAL LOW (ref 150–400)
RBC: 3.6 MIL/uL — ABNORMAL LOW (ref 3.87–5.11)
RDW: 19.9 % — ABNORMAL HIGH (ref 11.5–15.5)
WBC: 12.2 10*3/uL — ABNORMAL HIGH (ref 4.0–10.5)
nRBC: 0.2 % (ref 0.0–0.2)

## 2020-12-10 LAB — GLUCOSE, CAPILLARY
Glucose-Capillary: 156 mg/dL — ABNORMAL HIGH (ref 70–99)
Glucose-Capillary: 84 mg/dL (ref 70–99)
Glucose-Capillary: 149 mg/dL — ABNORMAL HIGH (ref 70–99)

## 2020-12-10 MED ORDER — FERROUS SULFATE 325 (65 FE) MG PO TABS
325.0000 mg | ORAL_TABLET | Freq: Every day | ORAL | Status: DC
  Administered 2020-12-10 – 2020-12-11 (×2): 325 mg via ORAL
  Filled 2020-12-10 (×2): qty 1

## 2020-12-10 MED ORDER — LACTATED RINGERS IV SOLN: INTRAVENOUS | Status: DC

## 2020-12-10 MED ORDER — WITCH HAZEL-GLYCERIN EX PADS: 1.0000 "application " | MEDICATED_PAD | CUTANEOUS | Status: DC | PRN

## 2020-12-10 MED ORDER — DIBUCAINE (PERIANAL) 1 % EX OINT: 1.0000 "application " | TOPICAL_OINTMENT | CUTANEOUS | Status: DC | PRN

## 2020-12-10 NOTE — Progress Notes (Signed)
Spoke with Dr. Terri Piedra regarding patient blood sugar 1 hour post eating of 149. Aware patient feels okay. MD would like CBG orders discontinued at this time due to pt being greater than 24 hours post op. Per MD no fasting blood sugar needed in the AM and no new treatment at this time. MD also aware patients honeycomb is loosened on the bottom probably due to patient showering today. Order given to change honeycomb dressing now.

## 2020-12-10 NOTE — Progress Notes (Signed)
Subjective: Postpartum Day 1: Cesarean Delivery Patient reports incisional pain, tolerating PO, + flatus and no problems voiding. Lochia mild. She denies CP, SOB, lightheadedness. She would like an abdominal binder. Bonding well with baby - bottlefeeding   Objective: Vital signs in last 24 hours: Temp:  [98.1 F (36.7 C)-98.6 F (37 C)] 98.6 F (37 C) (02/09 0417) Pulse Rate:  [93-103] 99 (02/09 0417) Resp:  [18] 18 (02/09 0417) BP: (115-127)/(68-80) 115/68 (02/09 0417) SpO2:  [99 %] 99 % (02/09 0417)  Physical Exam:  General: alert, cooperative and no distress Lochia: appropriate Uterine Fundus: firm Incision: no significant drainage DVT Evaluation: No evidence of DVT seen on physical exam. No significant calf/ankle edema.  Recent Labs    12/08/20 1000 12/10/20 0603  HGB 12.4 8.8*  HCT 40.7 29.3*    Assessment/Plan: Status post Cesarean section. Doing well postoperatively.  Continue current care Ordered abdominal binder.  Isaiah Serge 12/10/2020, 1:37 PM

## 2020-12-10 NOTE — Lactation Note (Signed)
This note was copied from a baby's chart. Lactation Consultation Note  Patient Name: Heather Bowman [id number] Date: 12/10/2020 Reason for consult: Initial assessment;Term Age:42 hours P2, term female infant -4% weight loss. Per mom, she initially want to BF but changed her mind, she is willing try latch infant at the breast, she plans to BF and formula fed infant. LC was sucking on pacifier when LC entered room, LC suggested mom wait until 3 weeks to offer pacifier because it may cue out infant hunger cues.  Mom latched infant on her right breast using the football hold position, infant latched with depth and BF for 17 minutes. While LC went to give mom hand pump ( walked away from room)  dad supplemented infant with 30 mls of formula. LC reviewed breastfeeding supplement sheet with parents, discussed pace feeding when formula feeding  and limit formula intake to 15 mls on day 2 per feeding.  Mom will BF infant every feeding first according to cues, 8 to 12+ times within 24 hours, STS and then supplement infant after each feeding. Mom knows to call RN or LC if she has any BF questions or concerns. Mom made aware of O/P services, breastfeeding support groups, community resources, and our phone # for post-discharge questions.  Maternal Data Has patient been taught Hand Expression?: Yes Does the patient have breastfeeding experience prior to this delivery?: Yes How long did the patient breastfeed?: Per mom, she breast and formula fed her 42 year old son for 4 months  Feeding Mother's Current Feeding Choice: Breast Milk and Formula Nipple Type: Slow - flow  LATCH Score Latch: Grasps breast easily, tongue down, lips flanged, rhythmical sucking.  Audible Swallowing: Spontaneous and intermittent  Type of Nipple: Everted at rest and after stimulation  Comfort (Breast/Nipple): Soft / non-tender  Hold (Positioning): Assistance needed to correctly position infant at breast and maintain  latch.  LATCH Score: 9   Lactation Tools Discussed/Used Tools: Pump Breast pump type: Manual Reason for Pumping: Mom doesn't have breast pump at home. Pumping frequency: prn  Interventions Interventions: Breast feeding basics reviewed;Assisted with latch;Skin to skin;Breast massage;Hand express;Pre-pump if needed;Breast compression;Adjust position;Support pillows;Position options;Expressed milk;Hand pump;Education  Discharge Pump: Manual WIC Program: Yes  Consult Status Consult Status: Follow-up Date: 12/11/20 Follow-up type: In-patient    Vicente Serene 12/10/2020, 4:51 PM

## 2020-12-10 NOTE — Progress Notes (Signed)
CBG 156, patient states she ate pizza from [employer] at 0500

## 2020-12-11 MED ORDER — OXYCODONE HCL 5 MG PO TABS: 5.0000 mg | ORAL_TABLET | ORAL | 0 refills | Status: AC | PRN

## 2020-12-11 MED ORDER — IBUPROFEN 100 MG/5ML PO SUSP: 800.0000 mg | Freq: Three times a day (TID) | ORAL | 1 refills | Status: AC

## 2020-12-11 NOTE — Progress Notes (Signed)
POD #2 LTCS Doing ok, some cramping and feels bloated, no n/v, not passing much gas Afeb, VSS Abd- soft, distended, fundus firm, incision intact  Overall doing well, but with some abdominal distension.  She declines dulcolax suppository.  Encouraged ambulation, will recheck this pm

## 2020-12-11 NOTE — Discharge Summary (Signed)
Postpartum Discharge Summary      Patient Name: Heather Bowman DOB: [birth year] MRN: [id number]  Date of admission: 12/09/2020 Delivery date:12/09/2020  Delivering provider: Paula Compton  Date of discharge: 12/11/2020  Admitting diagnosis: S/P repeat low transverse C-section [Z98.891] Intrauterine pregnancy: [redacted]w[redacted]d     Secondary diagnosis:  Active Problems:   S/P repeat low transverse C-section     Discharge diagnosis: Term Pregnancy [facility] course: Sceduled C/S   40 y.o. yo G2P2002 at 109w1d was admitted to the hospital 12/09/2020 for scheduled cesarean section with the following indication:Elective Repeat.Delivery details are as follows:  Membrane Rupture Time/Date: 7:57 AM ,12/09/2020   Delivery Method:C-Section, Low Transverse  Details of operation can be found in separate operative note.  Patient had an uncomplicated postpartum course.  She is ambulating, tolerating a regular diet, passing flatus, and urinating well. Patient is discharged home in stable condition on  12/11/20        Newborn Data: Birth date:12/09/2020  Birth time:7:58 AM  Gender:Female  Living status:Living  Apgars:9 ,9  Weight:3671 g      Physical exam  Vitals:   12/10/20 0417 12/10/20 1539 12/10/20 2125 12/11/20 0548  BP: 115/68 106/74 119/71 116/61  Pulse: 99 96 (!) 102 92  Resp: 18 18 18 18   Temp: 98.6 F (37 C) 98 F (36.7 C) 98.8 F (37.1 C) 98.1 F (36.7 C)  TempSrc: Oral Oral Oral Oral  SpO2: 99%  100% 100%  Weight:      Height:       General: alert Lochia: appropriate Uterine Fundus: firm Incision: Healing well with no significant drainage  Labs: Lab Results  Component Value Date   WBC 12.2 (H) 12/10/2020   HGB 8.8 (L) 12/10/2020   HCT 29.3 (L) 12/10/2020   MCV 81.4 12/10/2020   PLT 147 (L) 12/10/2020   CMP Latest Ref Rng & Units 12/08/2020  Glucose 70 - 99 mg/dL 180(H)  BUN 6 - 20 mg/dL 7  Creatinine 0.44 - 1.00 mg/dL  0.62  Sodium 135 - 145 mmol/L 134(L)  Potassium 3.5 - 5.1 mmol/L 4.1  Chloride 98 - 111 mmol/L 103  CO2 22 - 32 mmol/L 21(L)  Calcium 8.9 - 10.3 mg/dL 8.9  Total Protein 6.5 - 8.1 g/dL -  Total Bilirubin 0.3 - 1.2 mg/dL -  Alkaline Phos 38 - 126 U/L -  AST 15 - 41 U/L -  ALT 0 - 44 U/L -   Edinburgh Score: Edinburgh Postnatal Depression Scale Screening Tool 12/09/2020  I have been able to laugh and see the funny side of things. 0  I have looked forward with enjoyment to things. 0  I have blamed myself unnecessarily when things went wrong. 0  I have been anxious or worried for no good reason. 0  I have felt scared or panicky for no good reason. 0  Things have been getting on top of me. 0  I have been so unhappy that I have had difficulty sleeping. 0  I have felt sad or miserable. 0  I have been so unhappy that I have been crying. 0  The thought of harming myself has occurred to me. 0  Edinburgh Postnatal Depression Scale Total 0  After visit meds:  Allergies as of 12/11/2020   No Known Allergies     Medication List    STOP taking these medications   insulin lispro 100 UNIT/ML KwikPen Commonly known as: HUMALOG     TAKE these medications   calcium carbonate 500 MG chewable tablet Commonly known as: TUMS - dosed in mg elemental calcium Chew 2-4 tablets by mouth 2 (two) times daily as needed for indigestion or heartburn.   ferrous sulfate 325 (65 FE) MG tablet Take 325 mg by mouth at bedtime.   ibuprofen 100 MG/5ML suspension Commonly known as: ADVIL Take 40 mLs (800 mg total) by mouth every 8 (eight) hours.   oxyCODONE 5 MG immediate release tablet Commonly known as: Oxy IR/ROXICODONE Take 1 tablet (5 mg total) by mouth every 4 (four) hours as needed for severe pain.   PRENATAL GUMMIES PO Take 2 tablets by mouth daily.   valACYclovir 500 MG tablet Commonly known as: VALTREX Take 500 mg by mouth 2 (two) times daily as needed (outbreaks/flare ups).         Discharge home in stable condition Infant Feeding: Breast Infant Disposition:home with mother Discharge instruction: per After Visit Summary and Postpartum booklet. Activity: Advance as tolerated. Pelvic rest for 6 weeks.  Diet: routine diet  Postpartum Appointment:2 weeks Follow up Visit:  Follow-up Information    Paula Compton, MD. Schedule an appointment as soon as possible for a visit in 2 week(s).   Specialty: Obstetrics and Gynecology Contact information: [address] [place] [address] [phone]                   12/11/2020 Clarene Duke, MD

## 2020-12-11 NOTE — Discharge Instructions (Signed)
As per discharge pamphlet °

## 2020-12-12 ENCOUNTER — Inpatient Hospital Stay (HOSPITAL_COMMUNITY): Admit: 2020-12-12 | Payer: Self-pay

## 2021-04-09 NOTE — Telephone Encounter (Signed)
Opened in error

## 2022-08-26 ENCOUNTER — Encounter: Payer: Self-pay | Admitting: Internal Medicine

## 2024-01-03 ENCOUNTER — Telehealth: Payer: Self-pay | Admitting: Internal Medicine

## 2024-01-03 NOTE — Telephone Encounter (Signed)
 Dr. Marina Goodell,  Received referral from PCP for colonoscopy.  Patient last saw you in 2018 and then saw [facility] in 2023.  She is requesting to transfer care back to [facility] due to insurance.  Records are in EPIC.  Please advise scheduling.

## 2024-01-06 NOTE — Telephone Encounter (Signed)
 Okay for surveillance colonoscopy in [facility] "history of polyps". Prior history also of advanced adenoma.  Last colonoscopy was performed here in 2018. Thanks, Dr. Marina Goodell

## 2024-02-15 ENCOUNTER — Encounter: Admitting: Internal Medicine

## 2024-02-21 ENCOUNTER — Encounter: Admitting: Internal Medicine
